# Patient Record
Sex: Female | Born: 1950
Health system: Southern US, Community
[De-identification: ages and names within clinical notes are randomized; demographics above are authoritative.]

## PROBLEM LIST (undated history)

## (undated) DIAGNOSIS — E669 Obesity, unspecified: Secondary | ICD-10-CM

## (undated) DIAGNOSIS — N183 Chronic kidney disease, stage 3 unspecified: Secondary | ICD-10-CM

## (undated) DIAGNOSIS — E119 Type 2 diabetes mellitus without complications: Secondary | ICD-10-CM

## (undated) DIAGNOSIS — C3412 Malignant neoplasm of upper lobe, left bronchus or lung: Principal | ICD-10-CM

## (undated) DIAGNOSIS — I10 Essential (primary) hypertension: Secondary | ICD-10-CM

## (undated) DIAGNOSIS — J329 Chronic sinusitis, unspecified: Secondary | ICD-10-CM

## (undated) DIAGNOSIS — D649 Anemia, unspecified: Secondary | ICD-10-CM

## (undated) DIAGNOSIS — Z720 Tobacco use: Secondary | ICD-10-CM

## (undated) DIAGNOSIS — E538 Deficiency of other specified B group vitamins: Secondary | ICD-10-CM

## (undated) HISTORY — PX: ABDOMINAL HYSTERECTOMY: SHX81

## (undated) HISTORY — DX: Deficiency of other specified B group vitamins: E53.8

## (undated) HISTORY — DX: Malignant neoplasm of upper lobe, left bronchus or lung: C34.12

---

## 2005-03-02 ENCOUNTER — Ambulatory Visit: Payer: Self-pay | Admitting: General Practice

## 2013-09-01 ENCOUNTER — Emergency Department (HOSPITAL_COMMUNITY)
Admission: EM | Admit: 2013-09-01 | Discharge: 2013-09-02 | Disposition: A | Discharge status: Home / Self Care | Payer: PRIVATE HEALTH INSURANCE | Attending: Emergency Medicine | Admitting: Emergency Medicine

## 2013-09-01 ENCOUNTER — Encounter (HOSPITAL_COMMUNITY): Payer: Self-pay | Admitting: Emergency Medicine

## 2013-09-01 DIAGNOSIS — I10 Essential (primary) hypertension: Secondary | ICD-10-CM | POA: Insufficient documentation

## 2013-09-01 DIAGNOSIS — T783XXA Angioneurotic edema, initial encounter: Secondary | ICD-10-CM | POA: Insufficient documentation

## 2013-09-01 DIAGNOSIS — F17200 Nicotine dependence, unspecified, uncomplicated: Secondary | ICD-10-CM | POA: Insufficient documentation

## 2013-09-01 DIAGNOSIS — T4995XA Adverse effect of unspecified topical agent, initial encounter: Secondary | ICD-10-CM | POA: Insufficient documentation

## 2013-09-01 HISTORY — DX: Chronic sinusitis, unspecified: J32.9

## 2013-09-01 HISTORY — DX: Essential (primary) hypertension: I10

## 2013-09-01 LAB — POCT I-STAT, CHEM 8
BUN: 19 mg/dL (ref 6–23)
Calcium, Ion: 1.24 mmol/L (ref 1.13–1.30)
Creatinine, Ser: 1.3 mg/dL — ABNORMAL HIGH (ref 0.50–1.10)
Glucose, Bld: 106 mg/dL — ABNORMAL HIGH (ref 70–99)
HCT: 42 % (ref 36.0–46.0)
Hemoglobin: 14.3 g/dL (ref 12.0–15.0)
Potassium: 3.4 mEq/L — ABNORMAL LOW (ref 3.5–5.1)

## 2013-09-01 MED ORDER — DIPHENHYDRAMINE HCL 50 MG/ML IJ SOLN
INTRAMUSCULAR | Status: AC
  Filled 2013-09-01: qty 1

## 2013-09-01 MED ORDER — FAMOTIDINE IN NACL 20-0.9 MG/50ML-% IV SOLN
INTRAVENOUS | Status: AC
  Filled 2013-09-01: qty 50

## 2013-09-01 MED ORDER — EPINEPHRINE 0.3 MG/0.3ML IJ SOAJ
0.3000 mg | Freq: Once | INTRAMUSCULAR | Status: AC
  Administered 2013-09-01: 0.3 mg via INTRAMUSCULAR
  Filled 2013-09-01: qty 0.3

## 2013-09-01 MED ORDER — DIPHENHYDRAMINE HCL 50 MG/ML IJ SOLN
50.0000 mg | Freq: Once | INTRAMUSCULAR | Status: AC
  Administered 2013-09-01: 50 mg via INTRAVENOUS

## 2013-09-01 MED ORDER — METHYLPREDNISOLONE SODIUM SUCC 125 MG IJ SOLR
125.0000 mg | Freq: Once | INTRAMUSCULAR | Status: AC
  Administered 2013-09-01: 125 mg via INTRAVENOUS

## 2013-09-01 MED ORDER — METHYLPREDNISOLONE SODIUM SUCC 125 MG IJ SOLR
INTRAMUSCULAR | Status: AC
  Filled 2013-09-01: qty 2

## 2013-09-01 NOTE — ED Notes (Signed)
Ambulated to bathroom without difficulty     Pt feels that the swelling is less now below the left eye and beside left ear.  Remains very swollen and tight left side of mouth / lips upper and lower and left cheek.  Denies any difficulty breathing or any obstruction with tongue.  Manages oral secretions

## 2013-09-01 NOTE — ED Notes (Signed)
Pt and family states swelling appears to be going down. Pt remains on cardiac monitor. No respiratory distress noted.

## 2013-09-01 NOTE — ED Provider Notes (Signed)
CSN: [id number]     Arrival date & time 09/01/13  2056 History  This chart was scribed for Hilario Quarry, MD by Quintella Reichert, ED scribe.  This patient was seen in room APA06/APA06 and the patient's care was started at 9:19 PM.   Chief Complaint  Patient presents with  . Allergic Reaction    Patient is a 62 y.o. female presenting with allergic reaction. The history is provided by the patient and the spouse. No language interpreter was used.  Allergic Reaction Presenting symptoms: swelling   Presenting symptoms: no difficulty breathing, no difficulty swallowing, no itching, no rash and no wheezing   Swelling:    Location:  Face   Duration:  1 hour   Timing:  Constant   Progression:  Worsening   Chronicity:  New Severity:  Moderate Prior allergic episodes:  Allergies to medications Context: medications   Relieved by:  None tried Worsened by:  Nothing tried Ineffective treatments:  None tried    HPI Comments: Susan Mahoney is a 62 y.o. female with h/o HTN and recent sinus infection who presents to the Emergency Department complaining of one hour of constant, worsening facial swelling.  Pt states that she went to bed at around 7 PM and woke up at 8:15 PM and found that the left side of her cheek and lips were very swollen.  She states swelling has progressively worsened since then although her husband who was present when she first woke up states that currently her swelling looks "about the same" as at onset.  Pt denies tongue swelling, throat swelling, difficulty breathing or swallowing, CP, abdominal pain, nausea, vomiting, diarrhea, or any other associated symptoms.  She notes that she began taking amoxicillin 2 days ago for a sinus infection.  She has been taking this 1x/day and last took it today at around 7 PM.  She was also placed on lisinopril on 11/19 and increased her dosage from 1x/day to 2x/day 2 days ago.  She also took one dose of Flagyl 2 days ago.  Daughter states that  pt had a similar reaction to BP medication several years ago.  Pt denies h/o heart problems to her knowledge.   Past Medical History  Diagnosis Date  . Hypertension   . Sinus infection     Past Surgical History  Procedure Laterality Date  . Abdominal hysterectomy      History reviewed. No pertinent family history.   History  Substance Use Topics  . Smoking status: Current Every Day Smoker  . Smokeless tobacco: Not on file  . Alcohol Use: No    OB History   Grav Para Term Preterm Abortions TAB SAB Ect Mult Living                  Review of Systems  HENT: Positive for facial swelling. Negative for trouble swallowing.   Respiratory: Negative for shortness of breath and wheezing.   Cardiovascular: Negative for chest pain.  Gastrointestinal: Negative for nausea, vomiting, abdominal pain and diarrhea.  Skin: Negative for itching and rash.  All other systems reviewed and are negative.     Allergies  Review of patient's allergies indicates no known allergies.  Home Medications  No current outpatient prescriptions on file.  BP 156/84  Pulse 86  Temp(Src) 98.2 F (36.8 C) (Oral)  Resp 20  Ht 5' (1.524 m)  Wt 190 lb (86.183 kg)  BMI 37.11 kg/m2  SpO2 100%  Physical Exam  Nursing note and vitals  reviewed. Constitutional: She is oriented to person, place, and time. She appears well-developed and well-nourished.  HENT:  Head: Normocephalic and atraumatic.  Right Ear: Tympanic membrane and external ear normal.  Left Ear: Tympanic membrane and external ear normal.  Nose: Nose normal. Right sinus exhibits no maxillary sinus tenderness and no frontal sinus tenderness. Left sinus exhibits no maxillary sinus tenderness and no frontal sinus tenderness.  Swelling to left side of lips and cheek.  Upper lip more swollen than lower lip.  No swelling of, or oropharynx.  Eyes: Conjunctivae and EOM are normal. Pupils are equal, round, and reactive to light. Right eye exhibits no  nystagmus. Left eye exhibits no nystagmus.  Neck: Normal range of motion. Neck supple.  Cardiovascular: Normal rate, regular rhythm, normal heart sounds and intact distal pulses.   Pulmonary/Chest: Effort normal and breath sounds normal. No respiratory distress. She has no wheezes. She has no rales. She exhibits no tenderness.  Abdominal: Soft. Bowel sounds are normal. She exhibits no distension and no mass. There is no tenderness.  Musculoskeletal: Normal range of motion. She exhibits no edema and no tenderness.  Neurological: She is alert and oriented to person, place, and time.  Skin: Skin is warm and dry. No rash noted.  Psychiatric: She has a normal mood and affect. Her behavior is normal. Judgment and thought content normal.    ED Course  Procedures (including critical care time)  DIAGNOSTIC STUDIES: Oxygen Saturation is 100% on room air, normal by my interpretation.    COORDINATION OF CARE: 9:27 PM-Discussed treatment plan which includes Benadryl, Solu-Medrol, Epinephrine, oxygen therapy, labs, and monitoring pt's symptoms with pt at bedside and pt agreed to plan.    Labs Review Labs Reviewed - No data to display  Imaging Review No results found.  EKG Interpretation   None       MDM  No diagnosis found. 10:46 PM Patient with some decreased swelling of her cheek area and she's not feel any obstruction of her airway. Her tongue remains uninvolved as does her oropharynx. She continues to have some left upper and lower lip swelling the   62 year old female who comes in with angioedema which appears to be secondary to ACE inhibitor. She has improved although department his not appear to have any airway compromise. However, she is also on amoxicillin and is advised that she should avoid amoxicillin and penicillins in the future as they could not rule out that this was an allergic reaction. She is given strict return precautions and voices understanding. She also understands  she should not be on lisinopril or any ACE inhibitor in the future. She'll contact her primary care physician on Monday for change in her antihypertensives.   Hilario Quarry, MD 09/01/13 [phone]

## 2013-09-01 NOTE — ED Notes (Addendum)
Pt has facial , oral swelling,  Onset app 8 pm.  Started Lisinopril app 2 weeks ago  Being tx for sinus infection.  And taking amoxicillin.

## 2013-09-01 NOTE — ED Notes (Signed)
Pt reports going to bed & waking to find the left side of her face swollen, notable to cheeks & lips. No swelling noted to the tongue. Pt airway patent & pt talking w/ no complications.

## 2013-09-02 ENCOUNTER — Encounter (HOSPITAL_COMMUNITY): Payer: Self-pay | Admitting: Emergency Medicine

## 2013-09-02 ENCOUNTER — Observation Stay (HOSPITAL_COMMUNITY)
Admission: EM | Admit: 2013-09-02 | Discharge: 2013-09-03 | Disposition: A | Discharge status: Home / Self Care | Payer: PRIVATE HEALTH INSURANCE | Attending: Internal Medicine | Admitting: Internal Medicine

## 2013-09-02 DIAGNOSIS — T783XXA Angioneurotic edema, initial encounter: Principal | ICD-10-CM | POA: Insufficient documentation

## 2013-09-02 DIAGNOSIS — E669 Obesity, unspecified: Secondary | ICD-10-CM

## 2013-09-02 DIAGNOSIS — F17200 Nicotine dependence, unspecified, uncomplicated: Secondary | ICD-10-CM | POA: Insufficient documentation

## 2013-09-02 DIAGNOSIS — E1169 Type 2 diabetes mellitus with other specified complication: Secondary | ICD-10-CM | POA: Diagnosis present

## 2013-09-02 DIAGNOSIS — E119 Type 2 diabetes mellitus without complications: Secondary | ICD-10-CM | POA: Insufficient documentation

## 2013-09-02 DIAGNOSIS — Z720 Tobacco use: Secondary | ICD-10-CM

## 2013-09-02 DIAGNOSIS — I10 Essential (primary) hypertension: Secondary | ICD-10-CM | POA: Diagnosis present

## 2013-09-02 DIAGNOSIS — D72829 Elevated white blood cell count, unspecified: Secondary | ICD-10-CM | POA: Insufficient documentation

## 2013-09-02 DIAGNOSIS — T46905A Adverse effect of unspecified agents primarily affecting the cardiovascular system, initial encounter: Secondary | ICD-10-CM | POA: Insufficient documentation

## 2013-09-02 DIAGNOSIS — I498 Other specified cardiac arrhythmias: Secondary | ICD-10-CM | POA: Insufficient documentation

## 2013-09-02 DIAGNOSIS — D649 Anemia, unspecified: Secondary | ICD-10-CM | POA: Insufficient documentation

## 2013-09-02 DIAGNOSIS — R001 Bradycardia, unspecified: Secondary | ICD-10-CM | POA: Diagnosis present

## 2013-09-02 HISTORY — DX: Obesity, unspecified: E66.9

## 2013-09-02 HISTORY — DX: Tobacco use: Z72.0

## 2013-09-02 HISTORY — DX: Type 2 diabetes mellitus without complications: E11.9

## 2013-09-02 HISTORY — DX: Anemia, unspecified: D64.9

## 2013-09-02 LAB — TYPE AND SCREEN
ABO/RH(D): O POS
Antibody Screen: NEGATIVE

## 2013-09-02 LAB — POCT I-STAT, CHEM 8
BUN: 25 mg/dL — ABNORMAL HIGH (ref 6–23)
Calcium, Ion: 1.28 mmol/L (ref 1.13–1.30)
Chloride: 102 mEq/L (ref 96–112)
Creatinine, Ser: 1.1 mg/dL (ref 0.50–1.10)
Glucose, Bld: 169 mg/dL — ABNORMAL HIGH (ref 70–99)
HCT: 44 % (ref 36.0–46.0)
Hemoglobin: 15 g/dL (ref 12.0–15.0)
Potassium: 4.5 mEq/L (ref 3.5–5.1)
Sodium: 141 mEq/L (ref 135–145)
TCO2: 30 mmol/L (ref 0–100)

## 2013-09-02 LAB — GLUCOSE, CAPILLARY: Glucose-Capillary: 214 mg/dL — ABNORMAL HIGH (ref 70–99)

## 2013-09-02 LAB — MRSA PCR SCREENING: MRSA by PCR: NEGATIVE

## 2013-09-02 MED ORDER — MORPHINE SULFATE 2 MG/ML IJ SOLN: 2.0000 mg | INTRAMUSCULAR | Status: DC | PRN

## 2013-09-02 MED ORDER — ACETAMINOPHEN 325 MG PO TABS: 650.0000 mg | ORAL_TABLET | Freq: Four times a day (QID) | ORAL | Status: DC | PRN

## 2013-09-02 MED ORDER — POTASSIUM CHLORIDE IN NACL 20-0.9 MEQ/L-% IV SOLN
INTRAVENOUS | Status: DC
  Administered 2013-09-02 – 2013-09-03 (×2): via INTRAVENOUS

## 2013-09-02 MED ORDER — FAMOTIDINE IN NACL 20-0.9 MG/50ML-% IV SOLN
20.0000 mg | Freq: Once | INTRAVENOUS | Status: AC
  Administered 2013-09-02: 20 mg via INTRAVENOUS
  Filled 2013-09-02: qty 50

## 2013-09-02 MED ORDER — ACETAMINOPHEN 650 MG RE SUPP: 650.0000 mg | Freq: Four times a day (QID) | RECTAL | Status: DC | PRN

## 2013-09-02 MED ORDER — FAMOTIDINE IN NACL 20-0.9 MG/50ML-% IV SOLN
INTRAVENOUS | Status: AC
  Filled 2013-09-02: qty 50

## 2013-09-02 MED ORDER — HYDROCHLOROTHIAZIDE 12.5 MG PO CAPS
12.5000 mg | ORAL_CAPSULE | Freq: Every day | ORAL | Status: DC
  Administered 2013-09-02 – 2013-09-03 (×2): 12.5 mg via ORAL
  Filled 2013-09-02 (×2): qty 1

## 2013-09-02 MED ORDER — NICOTINE 14 MG/24HR TD PT24
14.0000 mg | MEDICATED_PATCH | Freq: Every day | TRANSDERMAL | Status: DC
  Administered 2013-09-02 – 2013-09-03 (×2): 14 mg via TRANSDERMAL
  Filled 2013-09-02 (×2): qty 1

## 2013-09-02 MED ORDER — DIPHENHYDRAMINE HCL 50 MG/ML IJ SOLN
12.5000 mg | Freq: Four times a day (QID) | INTRAMUSCULAR | Status: DC
  Administered 2013-09-02 – 2013-09-03 (×3): 12.5 mg via INTRAVENOUS
  Filled 2013-09-02 (×3): qty 1

## 2013-09-02 MED ORDER — INSULIN ASPART 100 UNIT/ML ~~LOC~~ SOLN
0.0000 [IU] | Freq: Every day | SUBCUTANEOUS | Status: DC
  Administered 2013-09-02: 2 [IU] via SUBCUTANEOUS

## 2013-09-02 MED ORDER — ALBUTEROL SULFATE (5 MG/ML) 0.5% IN NEBU: 2.5000 mg | INHALATION_SOLUTION | RESPIRATORY_TRACT | Status: DC | PRN

## 2013-09-02 MED ORDER — DIPHENHYDRAMINE HCL 50 MG/ML IJ SOLN
25.0000 mg | Freq: Once | INTRAMUSCULAR | Status: AC
  Administered 2013-09-02: 25 mg via INTRAVENOUS
  Filled 2013-09-02: qty 1

## 2013-09-02 MED ORDER — ONDANSETRON HCL 4 MG PO TABS: 4.0000 mg | ORAL_TABLET | Freq: Four times a day (QID) | ORAL | Status: DC | PRN

## 2013-09-02 MED ORDER — GUAIFENESIN-DM 100-10 MG/5ML PO SYRP: 5.0000 mL | ORAL_SOLUTION | ORAL | Status: DC | PRN

## 2013-09-02 MED ORDER — FAMOTIDINE IN NACL 20-0.9 MG/50ML-% IV SOLN
20.0000 mg | Freq: Two times a day (BID) | INTRAVENOUS | Status: DC
  Administered 2013-09-02 – 2013-09-03 (×2): 20 mg via INTRAVENOUS
  Filled 2013-09-02 (×4): qty 50

## 2013-09-02 MED ORDER — DEXAMETHASONE SODIUM PHOSPHATE 4 MG/ML IJ SOLN
12.0000 mg | Freq: Once | INTRAMUSCULAR | Status: AC
  Administered 2013-09-02: 12 mg via INTRAVENOUS
  Filled 2013-09-02: qty 3

## 2013-09-02 MED ORDER — METHYLPREDNISOLONE SODIUM SUCC 125 MG IJ SOLR
60.0000 mg | INTRAMUSCULAR | Status: DC
  Administered 2013-09-02: 60 mg via INTRAVENOUS
  Filled 2013-09-02: qty 2

## 2013-09-02 MED ORDER — OXYMETAZOLINE HCL 0.05 % NA SOLN
1.0000 | Freq: Every day | NASAL | Status: DC | PRN
  Filled 2013-09-02: qty 15

## 2013-09-02 MED ORDER — ONDANSETRON HCL 4 MG/2ML IJ SOLN: 4.0000 mg | Freq: Four times a day (QID) | INTRAMUSCULAR | Status: DC | PRN

## 2013-09-02 MED ORDER — INSULIN ASPART 100 UNIT/ML ~~LOC~~ SOLN
0.0000 [IU] | Freq: Three times a day (TID) | SUBCUTANEOUS | Status: DC
  Administered 2013-09-03: 3 [IU] via SUBCUTANEOUS

## 2013-09-02 MED ORDER — ALUM & MAG HYDROXIDE-SIMETH 200-200-20 MG/5ML PO SUSP: 30.0000 mL | Freq: Four times a day (QID) | ORAL | Status: DC | PRN

## 2013-09-02 NOTE — ED Notes (Signed)
Patient was seen here yesterday for left side angioedma that started yesterday as a reaction to lisinopril that she has been taking since 08/09/13. Patient reports being treated with solumedrol, benadryl, and epi-pen. Patient instructed to come back if worse. Patient lips now complete swollen and patient feels "tickle in back of throat." Denies any difficulty breathing or swallowing. Denies tongue swelling.

## 2013-09-02 NOTE — H&P (Signed)
[facility] History and Physical  Susan Mahoney ZOX:[id number] DOB: [birth year] DOA: 09/02/2013  Referring physician: ED physician, Dr. Juleen China PCP: Rush Barer, PA-C   Chief Complaint: Facial swelling.  HPI: Susan Mahoney is a 62 y.o. female with a history of hypertension and recent diagnosis of type 2 diabetes mellitus, who presents to the emergency department today with worsening swelling of her face and lips. She presented to the ED last night complaining of left-sided facial swelling. She was treated with Solu-Medrol, Benadryl, and an EpiPen for presumed ACE inhibitor induced angioedema. She was discharged in improved condition. At approximately 4:00 AM this morning, she felt more facial swelling. She looked in the mirror and found that the right side of her face was swollen and her lips were much more swollen. She denies tongue swelling, difficulty swallowing, or difficulty breathing. She denied taking further lisinopril which had been newly prescribed in November for add-on treatment of hypertension. She was recently started on treatment with amoxicillin as well for a sinus infection, but she has taken amoxicillin in the past with no allergic reaction. Currently, she complains of swollen lips and a swollen face, but no shortness of breath, chest pain, nausea, vomiting, or cough.  In the ED, she is afebrile and hemodynamically stable though transiently bradycardic with a heart rate of 46. Her heart rate is now 66. She is oxygenating 94% on room air Her lab data are significant for BUN of 25 and a glucose of 169. She is being admitted for further evaluation and management.    Review of Systems:  Constitutional:  No weight loss, night sweats, Fevers, chills, fatigue.  HEENT:  No headaches, Difficulty swallowing,Tooth/dental problems,Sore throat,  No sneezing, itching, ear ache. She does have mild nasal congestion but no facial pain.  Cardio-vascular:  No chest pain, Orthopnea,  PND, swelling in lower extremities, anasarca, dizziness, palpitations  GI:  No heartburn, indigestion, abdominal pain, nausea, vomiting, diarrhea, change in bowel habits, loss of appetite  Resp:  No shortness of breath with exertion or at rest. No excess mucus, no productive cough, No non-productive cough, No coughing up of blood.No change in color of mucus.No wheezing.No chest wall deformity  Skin:  no rash or lesions.  GU:  no dysuria, change in color of urine, no urgency or frequency. No flank pain.  Musculoskeletal:  No joint pain or swelling. No decreased range of motion. No back pain.  Psych:  No change in mood or affect. No depression or anxiety. No memory loss.   Past Medical History  Diagnosis Date  . Hypertension   . Sinus infection   . Diabetes mellitus, type 2   . Obesity   . Anemia   . Tobacco abuse    Past Surgical History  Procedure Laterality Date  . Abdominal hysterectomy     Social History: She is married. She has one daughter. She is employed. She smokes a half a pack of cigarettes per day. She denies alcohol use or illicit drug use.   Allergies  Allergen Reactions  . Ace Inhibitors Swelling    Family History  Problem Relation Age of Onset  . Cancer Father    family history: Her mother died of natural causes at 3 years of age. Her father died of colon cancer.   Prior to Admission medications   Medication Sig Start Date End Date Taking? Authorizing Provider  acetaminophen (TYLENOL) 500 MG tablet Take 500 mg by mouth every 6 (six) hours as needed for mild pain or  moderate pain.   Yes Historical Provider, MD  Cetirizine HCl 10 MG CAPS Take 10 mg by mouth at bedtime.   Yes Historical Provider, MD  FERROUS SULFATE PO Take 5 g by mouth 2 (two) times daily.   Yes Historical Provider, MD  fluconazole (DIFLUCAN) 150 MG tablet Take 150 mg by mouth once. *may repeat as a one-time dose in 3 days if still symptomatic*   Yes Historical Provider, MD   hydrochlorothiazide (MICROZIDE) 12.5 MG capsule Take 12.5 mg by mouth daily.   Yes Historical Provider, MD  oxymetazoline (NASAL SPRAY 12 HOUR) 0.05 % nasal spray Place 1 spray into both nostrils daily as needed for congestion.   Yes Historical Provider, MD   Physical Exam: Filed Vitals:   09/02/13 1708  BP: 155/95  Pulse: 66  Temp: 98.5 F (36.9 C)  Resp: 23    BP 155/95  Pulse 66  Temp(Src) 98.5 F (36.9 C) (Oral)  Resp 23  Ht 5' (1.524 m)  Wt 86.183 kg (190 lb)  BMI 37.11 kg/m2  SpO2 94%  General: Pleasant obese 62 year old African-American woman in no acute distress. Face: Moderate swelling of her face globally without periorbital edema. The swelling is mostly in the maxillary region. She has edema of her upper and lower lips. Her tongue is not swollen. Her mucous membranes are mildly dry. There is no posterior exudates, erythema, or edema. HEENT: Head is normocephalic and nontraumatic. Pupils are equal, round, and reactive to light. Extraocular movements are intact. Conjunctivae are clear. Sclera white. Oropharynx as above. Nasal mucosa is moist. Minimal maxillary tenderness bilaterally. No active rhinorrhea. Neck: Obese, minimal edema, supple, no appreciable adenopathy, no appreciable thyromegaly, no JVD, no bruit. Lungs/respiratory: Breathing is nonlabored. Lungs are clear to auscultation bilaterally. No stridor. Heart: S1, S2, with borderline bradycardia and a soft systolic murmur. Abdomen: Obese, positive bowel sounds, soft, nontender, nondistended. GU and rectal: Deferred. Extremities: Pedal pulses palpable. No pretibial edema and no pedal edema. No acute hot red joints. Psychiatric: She is alert and oriented x3. Her speech is clear. Her affect is pleasant. Neurologic: Cranial nerves II through XII are intact. Strength is 5 over 5 throughout. Sensation is intact.           Labs on Admission:  Basic Metabolic Panel:  Recent Labs Lab 09/01/13 2158 09/02/13 1033   NA 143 141  K 3.4* 4.5  CL 102 102  GLUCOSE 106* 169*  BUN 19 25*  CREATININE 1.30* 1.10   Liver Function Tests: No results found for this basename: AST, ALT, ALKPHOS, BILITOT, PROT, ALBUMIN,  in the last 168 hours No results found for this basename: LIPASE, AMYLASE,  in the last 168 hours No results found for this basename: AMMONIA,  in the last 168 hours CBC:  Recent Labs Lab 09/01/13 2158 09/02/13 1033  HGB 14.3 15.0  HCT 42.0 44.0   Cardiac Enzymes: No results found for this basename: CKTOTAL, CKMB, CKMBINDEX, TROPONINI,  in the last 168 hours  BNP (last 3 results) No results found for this basename: PROBNP,  in the last 8760 hours CBG: No results found for this basename: GLUCAP,  in the last 168 hours  Radiological Exams on Admission: No results found.  EKG: Independently reviewed. Not ordered.  Assessment/Plan Principal Problem:   Angioedema Active Problems:   Unspecified essential hypertension   Diabetes mellitus type 2 in obese   Morbid obesity   Tobacco abuse   Bradycardia   The patient is a 62 year old with a  classic presentation of angioedema from ACE inhibitor therapy. She was recently started on lisinopril in November 2014. She was treated in the ED last night with some improvement, but her symptoms worsened today. She appears to be in no respiratory distress. On exam, there is no evidence of stridor or airway compromise.    Plan: 1. Fresh frozen plasma was started in the ED. She was also given Decadron IV, IV Pepcid, and IV Benadryl.  2. We'll continue steroid therapy with Solu-Medrol. We'll continue IV Pepcid and IV Benadryl. 3. The patient and her family were instructed to not take lisinopril or any class of ACE inhibitors anymore indefinitely. 4. She was encouraged to stop smoking. Tobacco cessation counseling will be ordered. Will provide nicotine replacement therapy. 5. We'll start sliding scale NovoLog for diabetes. She was recently diagnosed  and is being treated with lifestyle changes only. However with recent steroid therapy, she will need additional pharmacological therapy. 6. We'll continue hydrochlorothiazide for treatment of hypertension. 7. Gentle IV fluids. 8. We'll observe her in the step down unit overnight. 9. We'll order additional laboratory studies in the morning.    Code Status: Full code Family Communication: Discussed with her husband and daughter. Disposition Plan: Anticipate discharge to home in 24-48 hours.  Time spent: One hour.  [facility] Pager [phone].

## 2013-09-02 NOTE — ED Provider Notes (Signed)
CSN: [id number]     Arrival date & time 09/02/13  4098 History  This chart was scribed for Raeford Razor, MD by Luisa Dago, ED Scribe. This patient was seen in room APA15/APA15 and the patient's care was started at 10:00 AM.    Chief Complaint  Patient presents with  . Angioedema    The history is provided by the patient. No language interpreter was used.   HPI Comments: Susan Mahoney is a 62 y.o. female who presents to the Emergency Department complaining of angioedema that started last night. Pt was seen in the ED last night for the same. She was given solumedrol, benadryl, and epi-pen with improvement. She was advised to stop the lisinopril and amoxicillin that she had ben prescribed for a sinus infection. She denies any further doses since then. She states that she has returned today due to worsening facial swelling that involves bilateral lips. She denies tongue involvement. She states that she has been unable to take her medications, eat or drink due to the swelling. She is complaining of an associated mild headache described as pressure to the right side of her head. Pt reports that its hard to close her mouth. She denies diarrhea, SOB, abdominal pain, nausea, and cough.  Past Medical History  Diagnosis Date  . Hypertension   . Sinus infection    Past Surgical History  Procedure Laterality Date  . Abdominal hysterectomy     Family History  Problem Relation Age of Onset  . Cancer Father    History  Substance Use Topics  . Smoking status: Current Every Day Smoker  . Smokeless tobacco: Not on file  . Alcohol Use: No   OB History   Grav Para Term Preterm Abortions TAB SAB Ect Mult Living   1 1 1       1      Review of Systems  Constitutional: Negative for fever and chills.  HENT: Positive for facial swelling. Negative for drooling.   Respiratory: Negative for cough and shortness of breath.   Gastrointestinal: Negative for nausea, abdominal pain and diarrhea.   Neurological: Positive for headaches.  All other systems reviewed and are negative.    Allergies  Ace inhibitors  Home Medications   Current Outpatient Rx  Name  Route  Sig  Dispense  Refill  . acetaminophen (TYLENOL) 500 MG tablet   Oral   Take 500 mg by mouth every 6 (six) hours as needed for mild pain or moderate pain.         . Cetirizine HCl 10 MG CAPS   Oral   Take 10 mg by mouth at bedtime.         Marland Kitchen FERROUS SULFATE PO   Oral   Take 5 g by mouth 2 (two) times daily.         . fluconazole (DIFLUCAN) 150 MG tablet   Oral   Take 150 mg by mouth once. *may repeat as a one-time dose in 3 days if still symptomatic*         . hydrochlorothiazide (MICROZIDE) 12.5 MG capsule   Oral   Take 12.5 mg by mouth daily.         Marland Kitchen oxymetazoline (NASAL SPRAY 12 HOUR) 0.05 % nasal spray   Each Nare   Place 1 spray into both nostrils daily as needed for congestion.          Triage Vitals: BP 146/63  Pulse 77  Resp 16  Ht 5' (1.524 m)  Wt 190 lb (86.183 kg)  BMI 37.11 kg/m2  SpO2 98%  Physical Exam  Nursing note and vitals reviewed. Constitutional: She appears well-developed and well-nourished. No distress.  HENT:  Head: Atraumatic.  Symmetric swelling of upper and lower lip. Bilateral facial swelling. Toungue appears grossly normal. Posterior pharynx is clear. Handling secretions.  Eyes: Conjunctivae are normal. Right eye exhibits no discharge. Left eye exhibits no discharge.  Neck: Neck supple.  Cardiovascular: Normal rate, regular rhythm and normal heart sounds.  Exam reveals no gallop and no friction rub.   No murmur heard. Pulmonary/Chest: Effort normal and breath sounds normal. No stridor. No respiratory distress.  Abdominal: Soft. She exhibits no distension. There is no tenderness.  Musculoskeletal: She exhibits no edema and no tenderness.  Neurological: She is alert.  Skin: Skin is warm and dry.  Psychiatric: She has a normal mood and affect. Her  behavior is normal. Thought content normal.    ED Course  Procedures (including critical care time)  DIAGNOSTIC STUDIES: Oxygen Saturation is 98% on room air, normal by my interpretation.    COORDINATION OF CARE: 10:04 AM-Discussed treatment plan which includes fresh frozen plasma with pt at bedside and pt agreed to plan.   Labs Review Labs Reviewed  POCT I-STAT, CHEM 8 - Abnormal; Notable for the following:    BUN 25 (*)    Glucose, Bld 169 (*)    All other components within normal limits  C4 COMPLEMENT  C1 ESTERASE INHIBITOR PANEL  TYPE AND SCREEN  PREPARE FRESH FROZEN PLASMA   Imaging Review No results found.  EKG Interpretation   None       MDM   1. Angioedema, initial encounter      62yF with angioedema. Suspicion remains that this is ACEI induced. Currently doesn't seem to have significant airway compromise, but progression of symptoms is concerning. Will transfuse FFP and continue to closely monitor.   Pt reports previous episode of facial swelling about 10 years ago. Thinks possibly may have been medication related, but can't remember specifics of that episode.   I personally preformed the services scribed in my presence. The recorded information has been reviewed is accurate. Raeford Razor, MD.    Raeford Razor, MD 09/02/13 [phone]

## 2013-09-02 NOTE — ED Notes (Signed)
Pt presents with bilateral facial swelling since last night. Pt was seen here last night and given meds to help with the swelling and was told to return to further evaluation if symptoms worsened. Pt states swelling of right side of face and lips have worsened. Pt denies difficulty breathing.

## 2013-09-03 LAB — CBC
HCT: 37.8 % (ref 36.0–46.0)
Hemoglobin: 11.7 g/dL — ABNORMAL LOW (ref 12.0–15.0)
MCHC: 31 g/dL (ref 30.0–36.0)
Platelets: 212 10*3/uL (ref 150–400)
RDW: 17.5 % — ABNORMAL HIGH (ref 11.5–15.5)
WBC: 14.5 10*3/uL — ABNORMAL HIGH (ref 4.0–10.5)

## 2013-09-03 LAB — COMPREHENSIVE METABOLIC PANEL
ALT: 27 U/L (ref 0–35)
BUN: 24 mg/dL — ABNORMAL HIGH (ref 6–23)
CO2: 26 mEq/L (ref 19–32)
Calcium: 10.1 mg/dL (ref 8.4–10.5)
Creatinine, Ser: 1.16 mg/dL — ABNORMAL HIGH (ref 0.50–1.10)
GFR calc Af Amer: 58 mL/min — ABNORMAL LOW (ref >90)
GFR calc non Af Amer: 50 mL/min — ABNORMAL LOW (ref >90)
Glucose, Bld: 245 mg/dL — ABNORMAL HIGH (ref 70–99)
Potassium: 3.9 mEq/L (ref 3.5–5.1)
Sodium: 141 mEq/L (ref 135–145)
Total Bilirubin: 0.2 mg/dL — ABNORMAL LOW (ref 0.3–1.2)
Total Protein: 7.7 g/dL (ref 6.0–8.3)

## 2013-09-03 LAB — PROTIME-INR
INR: 0.98 (ref 0.00–1.49)
Prothrombin Time: 12.8 seconds (ref 11.6–15.2)

## 2013-09-03 LAB — PREPARE FRESH FROZEN PLASMA
Unit division: 0
Unit division: 0

## 2013-09-03 LAB — TSH: TSH: 0.199 u[IU]/mL — ABNORMAL LOW (ref 0.350–4.500)

## 2013-09-03 LAB — GLUCOSE, CAPILLARY: Glucose-Capillary: 168 mg/dL — ABNORMAL HIGH (ref 70–99)

## 2013-09-03 MED ORDER — TRIAMTERENE-HCTZ 37.5-25 MG PO CAPS: 1.0000 | ORAL_CAPSULE | Freq: Every day | ORAL | Status: DC

## 2013-09-03 MED ORDER — FAMOTIDINE 10 MG PO TABS: 20.0000 mg | ORAL_TABLET | Freq: Two times a day (BID) | ORAL | Status: DC

## 2013-09-03 MED ORDER — GLIMEPIRIDE 1 MG PO TABS: ORAL_TABLET | ORAL | Status: DC

## 2013-09-03 MED ORDER — DIPHENHYDRAMINE HCL 25 MG PO TABS: 25.0000 mg | ORAL_TABLET | Freq: Two times a day (BID) | ORAL | Status: DC

## 2013-09-03 MED ORDER — PREDNISONE 20 MG PO TABS: 20.0000 mg | ORAL_TABLET | Freq: Every day | ORAL | Status: DC

## 2013-09-03 MED ORDER — CETIRIZINE HCL 10 MG PO CAPS: 10.0000 mg | ORAL_CAPSULE | Freq: Every day | ORAL | Status: DC

## 2013-09-03 NOTE — Discharge Summary (Signed)
Physician Discharge Summary  Susan Mahoney ZOX:[id number] DOB: 1950-10-20 DOA: 09/02/2013  PCP: Susan Mahoney  Admit date: 09/02/2013 Discharge date: 09/03/2013  Time spent: Greater than 30 minutes  Recommendations for Outpatient Follow-up:  1. The patient should have a followup of her diabetes. Amaryl was started at 1 mg daily when necessary for blood sugar of 200 and over. Hemoglobin A1c pending and TSH pending. 2. ACE inhibitors have been discontinued indefinitely.  Discharge Diagnoses:  1. Angioedema secondary to lisinopril. 2. Type 2 diabetes mellitus in obese patient, exacerbated by steroid treatment. 3. Hypertension. 4. Tobacco abuse. 5. Microcytic anemia. Further workup per primary care provider. 6. Transient bradycardia. 7. Leukocytosis secondary to steroid therapy.  Discharge Condition: Improved.  Diet recommendation: Heart healthy/carbohydrate modified.  Filed Weights   09/02/13 0931 09/03/13 0500  Weight: 86.183 kg (190 lb) 84.7 kg (186 lb 11.7 oz)    History of present illness:   HPI: Susan Mahoney is a 62 y.o. female with a history of hypertension and recent diagnosis of type 2 diabetes mellitus, who presented to the emergency department with worsening swelling of her face and lips. She presented to the ED the night before complaining of left-sided facial swelling. She was treated with Solu-Medrol, Benadryl, and an EpiPen for presumed ACE inhibitor induced angioedema. She was discharged in improved condition. At approximately 4:00 AM this morning, she felt more facial swelling. She looked in the mirror and found that the right side of her face was swollen and her lips were much more swollen. She denied tongue swelling, difficulty swallowing, or difficulty breathing. She denied taking further lisinopril which had been newly prescribed in November for add-on treatment of hypertension. She was recently started on treatment with amoxicillin as well for a sinus  infection, but she has taken amoxicillin in the past with no allergic reaction. She denied shortness of breath, chest pain, nausea, vomiting, or cough.  In the ED, she was afebrile and hemodynamically stable though transiently bradycardic with a heart rate of 46. Her heart rate improved to 66. She was oxygenating 94% on room air Her lab data were significant for BUN of 25 and a glucose of 169. She was admitted for further evaluation and management.   Hospital Course:  The patient was started on fresh frozen plasma in the emergency department. She was also given IV Decadron, IV Pepcid, and IV Benadryl in the ED. She was admitted to the step down unit for closer observation. Steroid therapy continued with Solu-Medrol. She was also continued on IV Pepcid and IV Benadryl. Supportive treatment was given. She was started on gentle IV fluids. She was instructed to discontinue taking lisinopril as she had been instructed previously and to never take it and all other ACE inhibitor medications in that class again. She voiced understanding. She was started on hydrochlorothiazide during hospitalization. However, she had been taking triamterene/HCTZ before it was discontinued in favor of lisinopril by her primary care provider. She was instructed to restart this medication upon discharge. A nicotine patch was placed for nicotine replacement therapy. She was strongly advised to stop smoking. She was recently diagnosed with diabetes. Because of steroid therapy, her blood glucose increased to the 200s. Sliding scale NovoLog was started. She was instructed on home glucose monitoring. Amaryl was prescribed at the time of discharge at 1 mg daily when necessary for blood glucose of 200 and over. Further management will be deferred to her primary care provider.  The following morning, the patient's angioedema completely resolved.  She had no facial or lip edema. There was no evidence of shortness of breath or any airway compromise  at all during hospitalization. She was discharged on 3 more days of Pepcid, Benadryl, and prednisone. She will followup with her primary care provider in a couple days.  Procedures:  None  Consultations:  None  Discharge Exam: Filed Vitals:   09/03/13 0400  BP: 111/100  Pulse:   Temp: 98 F (36.7 C)  Resp: 20   pulse 80. Oxygen saturation 96%.  General: Pleasant alert 62 year old woman in no acute distress. Face: Resolution of facial edema and lip edema. Cardiovascular: S1, S2, with a soft systolic murmur. Respiratory: Clear to auscultation bilaterally.  Discharge Instructions  Discharge Orders   Future Orders Complete By Expires   Diet - low sodium heart healthy  As directed    Diet Carb Modified  As directed    Discharge instructions  As directed    Comments:     Do not take lisinopril or any blood pressure medications in the ACE inhibitor group ever again.   Increase activity slowly  As directed        Medication List    STOP taking these medications       hydrochlorothiazide 12.5 MG capsule  Commonly known as:  MICROZIDE      TAKE these medications       acetaminophen 500 MG tablet  Commonly known as:  TYLENOL  Take 500 mg by mouth every 6 (six) hours as needed for mild pain or moderate pain.     Cetirizine HCl 10 MG Caps  Take 1 capsule (10 mg total) by mouth at bedtime. Restart after you've completed a three-day course of Benadryl.     diphenhydrAMINE 25 MG tablet  Commonly known as:  BENADRYL  Take 1 tablet (25 mg total) by mouth 2 (two) times daily. Take for 3 more days.     famotidine 10 MG tablet  Commonly known as:  PEPCID AC  Take 2 tablets (20 mg total) by mouth 2 (two) times daily. Take for 3 more days.     FERROUS SULFATE PO  Take 5 g by mouth 2 (two) times daily.     fluconazole 150 MG tablet  Commonly known as:  DIFLUCAN  Take 150 mg by mouth once. *may repeat as a one-time dose in 3 days if still symptomatic*     glimepiride 1 MG  tablet  Commonly known as:  AMARYL  Take 1 tablet daily if your blood sugar is 200 and over. Further instructions per your primary care provider.     NASAL SPRAY 12 HOUR 0.05 % nasal spray  Generic drug:  oxymetazoline  Place 1 spray into both nostrils daily as needed for congestion.     predniSONE 20 MG tablet  Commonly known as:  DELTASONE  Take 1 tablet (20 mg total) by mouth daily with breakfast. Take for 3 more days.     triamterene-hydrochlorothiazide 37.5-25 MG per capsule  Commonly known as:  DYAZIDE  Take 1 each (1 capsule total) by mouth daily.       Allergies  Allergen Reactions  . Ace Inhibitors Swelling       Follow-up Information   Follow up with CLAGGETT,ELIN, PA-C In 2 days.   Specialty:  Family Medicine   Contact information:   [address] [place] [address] [phone]        The results of significant diagnostics from this hospitalization (including imaging,  microbiology, ancillary and laboratory) are listed below for reference.    Significant Diagnostic Studies: No results found.  Microbiology: Recent Results (from the past 240 hour(s))  MRSA PCR SCREENING     Status: None   Collection Time    09/02/13  3:45 PM      Result Value Range Status   MRSA by PCR NEGATIVE  NEGATIVE Final   Comment:            The GeneXpert MRSA Assay (FDA     approved for NASAL specimens     only), is one component of a     comprehensive MRSA colonization     surveillance program. It is not     intended to diagnose MRSA     infection nor to guide or     monitor treatment for     MRSA infections.     Labs: Basic Metabolic Panel:  Recent Labs Lab 09/01/13 2158 09/02/13 1033 09/03/13 0443  NA 143 141 141  K 3.4* 4.5 3.9  CL 102 102 101  CO2  --   --  26  GLUCOSE 106* 169* 245*  BUN 19 25* 24*  CREATININE 1.30* 1.10 1.16*  CALCIUM  --   --  10.1   Liver Function Tests:  Recent Labs Lab 09/03/13 0443  AST 21  ALT 27  ALKPHOS 105   BILITOT 0.2*  PROT 7.7  ALBUMIN 3.6   No results found for this basename: LIPASE, AMYLASE,  in the last 168 hours No results found for this basename: AMMONIA,  in the last 168 hours CBC:  Recent Labs Lab 09/01/13 2158 09/02/13 1033 09/03/13 0443  WBC  --   --  14.5*  HGB 14.3 15.0 11.7*  HCT 42.0 44.0 37.8  MCV  --   --  67.1*  PLT  --   --  212   Cardiac Enzymes: No results found for this basename: CKTOTAL, CKMB, CKMBINDEX, TROPONINI,  in the last 168 hours BNP: BNP (last 3 results) No results found for this basename: PROBNP,  in the last 8760 hours CBG:  Recent Labs Lab 09/02/13 2131 09/03/13 0756  GLUCAP 214* 168*       Signed:  Nubia Ziesmer  [facility] 09/03/2013, 9:19 AM

## 2013-09-03 NOTE — Progress Notes (Signed)
Utilization Review completed.  

## 2013-09-03 NOTE — Progress Notes (Signed)
Client is stable at this time, facial edema resolved. Reviewed discharge instructions, reviewed discharge medication list, instructed on use of glucometer, when to test blood sugars, and how to keep blood sugar log. Instructed on normal blood sugar readings and s/s of hyper and hypo glycemia and treatment of each, and advised on s/s to report to MD. Client to schedule follow up appointment with PCP for this week. Client discharged to home with spouse in stable condition.

## 2013-09-05 LAB — C4 COMPLEMENT: Complement C4, Body Fluid: 78 mg/dL — ABNORMAL HIGH (ref 10–40)

## 2013-09-06 LAB — C1 ESTERASE INHIBITOR: C1INH SerPl-mCnc: 48 mg/dL — ABNORMAL HIGH (ref 21–39)

## 2013-09-07 ENCOUNTER — Encounter (HOSPITAL_COMMUNITY): Payer: Self-pay | Admitting: Emergency Medicine

## 2013-09-07 ENCOUNTER — Emergency Department (HOSPITAL_COMMUNITY): Payer: PRIVATE HEALTH INSURANCE

## 2013-09-07 ENCOUNTER — Emergency Department (HOSPITAL_COMMUNITY)
Admission: EM | Admit: 2013-09-07 | Discharge: 2013-09-07 | Disposition: A | Discharge status: Home / Self Care | Payer: PRIVATE HEALTH INSURANCE | Attending: Emergency Medicine | Admitting: Emergency Medicine

## 2013-09-07 DIAGNOSIS — R9389 Abnormal findings on diagnostic imaging of other specified body structures: Secondary | ICD-10-CM

## 2013-09-07 DIAGNOSIS — Z792 Long term (current) use of antibiotics: Secondary | ICD-10-CM | POA: Insufficient documentation

## 2013-09-07 DIAGNOSIS — E119 Type 2 diabetes mellitus without complications: Secondary | ICD-10-CM | POA: Insufficient documentation

## 2013-09-07 DIAGNOSIS — N289 Disorder of kidney and ureter, unspecified: Secondary | ICD-10-CM | POA: Insufficient documentation

## 2013-09-07 DIAGNOSIS — Z79899 Other long term (current) drug therapy: Secondary | ICD-10-CM | POA: Insufficient documentation

## 2013-09-07 DIAGNOSIS — D649 Anemia, unspecified: Secondary | ICD-10-CM | POA: Insufficient documentation

## 2013-09-07 DIAGNOSIS — E669 Obesity, unspecified: Secondary | ICD-10-CM | POA: Insufficient documentation

## 2013-09-07 DIAGNOSIS — R062 Wheezing: Secondary | ICD-10-CM | POA: Insufficient documentation

## 2013-09-07 DIAGNOSIS — Z8709 Personal history of other diseases of the respiratory system: Secondary | ICD-10-CM | POA: Insufficient documentation

## 2013-09-07 DIAGNOSIS — I10 Essential (primary) hypertension: Secondary | ICD-10-CM | POA: Insufficient documentation

## 2013-09-07 DIAGNOSIS — R0602 Shortness of breath: Secondary | ICD-10-CM | POA: Insufficient documentation

## 2013-09-07 DIAGNOSIS — F17200 Nicotine dependence, unspecified, uncomplicated: Secondary | ICD-10-CM | POA: Insufficient documentation

## 2013-09-07 LAB — GLUCOSE, CAPILLARY: Glucose-Capillary: 119 mg/dL — ABNORMAL HIGH (ref 70–99)

## 2013-09-07 LAB — CBC WITH DIFFERENTIAL/PLATELET
Basophils Relative: 0 % (ref 0–1)
Eosinophils Relative: 0 % (ref 0–5)
HCT: 43.2 % (ref 36.0–46.0)
Hemoglobin: 13.6 g/dL (ref 12.0–15.0)
Lymphs Abs: 5 10*3/uL — ABNORMAL HIGH (ref 0.7–4.0)
MCH: 21 pg — ABNORMAL LOW (ref 26.0–34.0)
MCV: 66.7 fL — ABNORMAL LOW (ref 78.0–100.0)
Monocytes Absolute: 0.7 10*3/uL (ref 0.1–1.0)
Monocytes Relative: 6 % (ref 3–12)
Neutro Abs: 6.5 10*3/uL (ref 1.7–7.7)
RBC: 6.48 MIL/uL — ABNORMAL HIGH (ref 3.87–5.11)
WBC: 12.2 10*3/uL — ABNORMAL HIGH (ref 4.0–10.5)

## 2013-09-07 LAB — BASIC METABOLIC PANEL
BUN: 31 mg/dL — ABNORMAL HIGH (ref 6–23)
CO2: 26 mEq/L (ref 19–32)
Chloride: 96 mEq/L (ref 96–112)
Creatinine, Ser: 1.34 mg/dL — ABNORMAL HIGH (ref 0.50–1.10)
GFR calc Af Amer: 49 mL/min — ABNORMAL LOW (ref >90)
Glucose, Bld: 119 mg/dL — ABNORMAL HIGH (ref 70–99)
Potassium: 3.4 mEq/L — ABNORMAL LOW (ref 3.5–5.1)

## 2013-09-07 LAB — D-DIMER, QUANTITATIVE: D-Dimer, Quant: 0.27 ug/mL-FEU (ref 0.00–0.48)

## 2013-09-07 MED ORDER — ALBUTEROL SULFATE HFA 108 (90 BASE) MCG/ACT IN AERS: 1.0000 | INHALATION_SPRAY | Freq: Four times a day (QID) | RESPIRATORY_TRACT | Status: AC | PRN

## 2013-09-07 NOTE — ED Notes (Signed)
Patient c/o shortness of breath. Per patient took amoxicillin at 12pm today for sinus infection and at 2pm she became "lightheadness," sweating, and short of breath. Denies any chest pain. Per patient now longer "lightheaded." Patent reports EMS coming to see her after episode and doing an EKG. Per patient EMS told her that her blood pressure was elevated (180/100).

## 2013-09-07 NOTE — ED Provider Notes (Signed)
CSN: [id number]     Arrival date & time 09/07/13  1624 History   First MD Initiated Contact with Patient 09/07/13 1638  This chart was scribed for Nelia Shi, MD by Valera Castle, ED Scribe. This patient was seen in room APA10/AP[facility]10 and the patient's care was started at 4:45 PM.     Chief Complaint  Patient presents with  . Shortness of Breath  . Hypertension    The history is provided by the patient and the EMS personnel. No language interpreter was used.   HPI Comments: Susan Mahoney is a 62 y.o. female brought in by EMS who presents to the Emergency Department complaining of sudden, moderate SOB, with associated diaphoresis and light-headedness, while at work around 2:00 PM today after taking Amoxicillin at 12:00 PM. She reports when the EMS first made contact with her she could hear herself wheezing. She states she had been taking Amoxicillin for a sinus infection, and having first started on it last week per PCP. She reports h/o taking the same antibiotic before without any complications. She reports an allergic reaction to new blood pressure medication Lipitor last week, stating her lips were swollen, so they put her back on her old blood pressure medicine. She reports a h/o HTN and states that EMS did an EKG and checked her BP, after she had told them her BP was elevated to 180/100. She denies rash, chest pain, and any other associated symptoms. She reports h/o smoking .5 PPD. She denies h/o DVT and leg swelling.  PCP - Rush Barer, PA-C  Past Medical History  Diagnosis Date  . Hypertension   . Sinus infection   . Diabetes mellitus, type 2   . Obesity   . Anemia   . Tobacco abuse    Past Surgical History  Procedure Laterality Date  . Abdominal hysterectomy     Family History  Problem Relation Age of Onset  . Cancer Father    History  Substance Use Topics  . Smoking status: Current Every Day Smoker -- 0.50 packs/day for 30 years    Types: Cigarettes  . Smokeless  tobacco: Never Used  . Alcohol Use: No   OB History   Grav Para Term Preterm Abortions TAB SAB Ect Mult Living   1 1 1       1      Review of Systems A complete 10 system review of systems was obtained and all systems are negative except as noted in the HPI and PMH.   Allergies  Ace inhibitors  Home Medications   Current Outpatient Rx  Name  Route  Sig  Dispense  Refill  . acetaminophen (TYLENOL) 500 MG tablet   Oral   Take 500 mg by mouth every 6 (six) hours as needed for mild pain or moderate pain.         Marland Kitchen amoxicillin (AMOXIL) 500 MG capsule   Oral   Take 500 mg by mouth 2 (two) times daily.         Marland Kitchen FERROUS SULFATE PO   Oral   Take 5 g by mouth 2 (two) times daily.         Marland Kitchen glimepiride (AMARYL) 1 MG tablet      Take 1 tablet daily if your blood sugar is 200 and over. Further instructions per your primary care provider.   30 tablet   1   . oxymetazoline (NASAL SPRAY 12 HOUR) 0.05 % nasal spray   Each Nare   Place  1 spray into both nostrils daily as needed for congestion.         . triamterene-hydrochlorothiazide (DYAZIDE) 37.5-25 MG per capsule   Oral   Take 1 each (1 capsule total) by mouth daily.         Marland Kitchen albuterol (PROVENTIL HFA;VENTOLIN HFA) 108 (90 BASE) MCG/ACT inhaler   Inhalation   Inhale 1-2 puffs into the lungs every 6 (six) hours as needed for wheezing or shortness of breath.   1 Inhaler   0   . Cetirizine HCl 10 MG CAPS   Oral   Take 1 capsule (10 mg total) by mouth at bedtime. Restart after you've completed a three-day course of Benadryl.         . diphenhydrAMINE (BENADRYL) 25 MG tablet   Oral   Take 1 tablet (25 mg total) by mouth 2 (two) times daily. Take for 3 more days.         . famotidine (PEPCID AC) 10 MG tablet   Oral   Take 2 tablets (20 mg total) by mouth 2 (two) times daily. Take for 3 more days.         . fluconazole (DIFLUCAN) 150 MG tablet   Oral   Take 150 mg by mouth once. *may repeat as a one-time  dose in 3 days if still symptomatic*         . predniSONE (DELTASONE) 20 MG tablet   Oral   Take 1 tablet (20 mg total) by mouth daily with breakfast. Take for 3 more days.   3 tablet   0    BP 118/69  Pulse 92  Temp(Src) 97.7 F (36.5 C)  Resp 18  Ht 4\' 11"  (1.499 m)  Wt 187 lb (84.823 kg)  BMI 37.75 kg/m2  SpO2 98%  Physical Exam  Nursing note and vitals reviewed. Constitutional: She is oriented to person, place, and time. She appears well-developed and well-nourished. No distress.  HENT:  Head: Normocephalic and atraumatic.  Eyes: Pupils are equal, round, and reactive to light.  Neck: Normal range of motion.  Cardiovascular: Normal rate and intact distal pulses.   Pulmonary/Chest: No respiratory distress. She has wheezes (Minor scattered wheezes).  Abdominal: Normal appearance. She exhibits no distension. Mass: .edscribe.  Musculoskeletal: Normal range of motion.  Neurological: She is alert and oriented to person, place, and time. No cranial nerve deficit.  Skin: Skin is warm and dry. No rash noted.  Psychiatric: She has a normal mood and affect. Her behavior is normal.    ED Course  Procedures (including critical care time)  DIAGNOSTIC STUDIES: Oxygen Saturation is 98% on room air, normal by my interpretation.    COORDINATION OF CARE: 4:50 PM-Discussed treatment plan which includes CXR, D-dimer, Troponin, Chem 8, POCT CBG monitoring, and EKG with pt at bedside and pt agreed to plan.   Labs Review Labs Reviewed  GLUCOSE, CAPILLARY - Abnormal; Notable for the following:    Glucose-Capillary 119 (*)    All other components within normal limits  CBC WITH DIFFERENTIAL - Abnormal; Notable for the following:    WBC 12.2 (*)    RBC 6.48 (*)    MCV 66.7 (*)    MCH 21.0 (*)    RDW 17.7 (*)    Lymphs Abs 5.0 (*)    All other components within normal limits  BASIC METABOLIC PANEL - Abnormal; Notable for the following:    Potassium 3.4 (*)    Glucose, Bld 119 (*)  BUN 31 (*)    Creatinine, Ser 1.34 (*)    Calcium 10.6 (*)    GFR calc non Af Amer 42 (*)    GFR calc Af Amer 49 (*)    All other components within normal limits  D-DIMER, QUANTITATIVE  TROPONIN I  PARATHYROID HORMONE, INTACT (NO CA)   Imaging Review Dg Chest 2 View  09/07/2013   CLINICAL DATA:  Shortness of breath.  Intermittent dizziness.  EXAM: CHEST  2 VIEW  COMPARISON:  None.  FINDINGS: There is a 2 cm soft tissue density at the right lung apex which has a nodular appearance. This may represent an unusual tortuous brachiocephalic vessel but there are no prior studies available for comparison and this could represent a apical lung mass.  Heart size and pulmonary vascularity are normal. Slight peribronchial thickening which could be acute or chronic. No effusions or consolidative infiltrates. No acute osseous abnormality.  IMPRESSION: 1. 2 cm soft tissue density at the right apex. CT scan with contrast may be useful for further evaluation. 2. Slight peribronchial thickening which could represent acute or chronic bronchitis.   Electronically Signed   By: Geanie Cooley M.D.   On: 09/07/2013 17:50    EKG Interpretation    Date/Time:  Thursday September 07 2013 16:29:06 EST Ventricular Rate:  96 PR Interval:  148 QRS Duration: 72 QT Interval:  358 QTC Calculation: 452 R Axis:   49 Text Interpretation:  Normal sinus rhythm Biatrial enlargement Abnormal ECG No previous ECGs available Confirmed by Victormanuel Mclure  MD, Shandie Bertz ([id number]) on 09/07/2013 4:52:36 PM            Meds ordered this encounter  Medications  . amoxicillin (AMOXIL) 500 MG capsule    Sig: Take 500 mg by mouth 2 (two) times daily.  Marland Kitchen albuterol (PROVENTIL HFA;VENTOLIN HFA) 108 (90 BASE) MCG/ACT inhaler    Sig: Inhale 1-2 puffs into the lungs every 6 (six) hours as needed for wheezing or shortness of breath.    Dispense:  1 Inhaler    Refill:  0   Because of the severe renal insufficiency I will refer to her primary care  physician for further workup of the abnormal chest x-ray.  Also drew a PTH in order to further evaluate her elevated serum calcium.  This can also be followed by her primary care physician.  I will prescribe some albuterol and encourage her to stop smoking. MDM   1. Shortness of breath   2. Hypercalcemia   3. Abnormal finding on chest xray   4. Renal insufficiency         I personally performed the services described in this documentation, which was scribed in my presence. The recorded information has been reviewed and considered.   Nelia Shi, MD 09/07/13 [phone]

## 2013-09-08 LAB — PARATHYROID HORMONE, INTACT (NO CA): PTH: 97.8 pg/mL — ABNORMAL HIGH (ref 14.0–72.0)

## 2013-09-12 ENCOUNTER — Other Ambulatory Visit (HOSPITAL_COMMUNITY): Payer: Self-pay | Admitting: Family Medicine

## 2013-09-12 DIAGNOSIS — J449 Chronic obstructive pulmonary disease, unspecified: Secondary | ICD-10-CM

## 2013-09-19 ENCOUNTER — Ambulatory Visit (HOSPITAL_COMMUNITY)
Admission: RE | Admit: 2013-09-19 | Discharge: 2013-09-19 | Disposition: A | Discharge status: Home / Self Care | Payer: PRIVATE HEALTH INSURANCE | Source: Ambulatory Visit | Attending: Family Medicine | Admitting: Family Medicine

## 2013-09-19 DIAGNOSIS — J449 Chronic obstructive pulmonary disease, unspecified: Secondary | ICD-10-CM

## 2013-09-19 DIAGNOSIS — R918 Other nonspecific abnormal finding of lung field: Secondary | ICD-10-CM | POA: Insufficient documentation

## 2013-09-19 DIAGNOSIS — J984 Other disorders of lung: Secondary | ICD-10-CM | POA: Insufficient documentation

## 2013-09-19 DIAGNOSIS — J42 Unspecified chronic bronchitis: Secondary | ICD-10-CM | POA: Insufficient documentation

## 2013-09-19 DIAGNOSIS — R937 Abnormal findings on diagnostic imaging of other parts of musculoskeletal system: Secondary | ICD-10-CM | POA: Insufficient documentation

## 2014-07-23 ENCOUNTER — Encounter (HOSPITAL_COMMUNITY): Payer: Self-pay | Admitting: Emergency Medicine

## 2016-02-16 ENCOUNTER — Inpatient Hospital Stay (HOSPITAL_COMMUNITY)
Admission: EM | Admit: 2016-02-16 | Discharge: 2016-02-20 | DRG: 181 | Disposition: A | Discharge status: Home / Self Care | Payer: Medicaid Other | Attending: Internal Medicine | Admitting: Internal Medicine

## 2016-02-16 ENCOUNTER — Encounter (HOSPITAL_COMMUNITY): Payer: Self-pay | Admitting: *Deleted

## 2016-02-16 DIAGNOSIS — E1122 Type 2 diabetes mellitus with diabetic chronic kidney disease: Secondary | ICD-10-CM | POA: Diagnosis present

## 2016-02-16 DIAGNOSIS — C3412 Malignant neoplasm of upper lobe, left bronchus or lung: Principal | ICD-10-CM | POA: Diagnosis present

## 2016-02-16 DIAGNOSIS — K635 Polyp of colon: Secondary | ICD-10-CM | POA: Diagnosis present

## 2016-02-16 DIAGNOSIS — D509 Iron deficiency anemia, unspecified: Secondary | ICD-10-CM | POA: Diagnosis present

## 2016-02-16 DIAGNOSIS — K222 Esophageal obstruction: Secondary | ICD-10-CM | POA: Diagnosis present

## 2016-02-16 DIAGNOSIS — E669 Obesity, unspecified: Secondary | ICD-10-CM | POA: Diagnosis present

## 2016-02-16 DIAGNOSIS — K2100 Gastro-esophageal reflux disease with esophagitis, without bleeding: Secondary | ICD-10-CM | POA: Insufficient documentation

## 2016-02-16 DIAGNOSIS — M25512 Pain in left shoulder: Secondary | ICD-10-CM | POA: Diagnosis present

## 2016-02-16 DIAGNOSIS — D500 Iron deficiency anemia secondary to blood loss (chronic): Secondary | ICD-10-CM | POA: Diagnosis present

## 2016-02-16 DIAGNOSIS — Z800 Family history of malignant neoplasm of digestive organs: Secondary | ICD-10-CM

## 2016-02-16 DIAGNOSIS — Z90710 Acquired absence of both cervix and uterus: Secondary | ICD-10-CM

## 2016-02-16 DIAGNOSIS — Z6834 Body mass index (BMI) 34.0-34.9, adult: Secondary | ICD-10-CM

## 2016-02-16 DIAGNOSIS — N183 Chronic kidney disease, stage 3 unspecified: Secondary | ICD-10-CM | POA: Diagnosis present

## 2016-02-16 DIAGNOSIS — R252 Cramp and spasm: Secondary | ICD-10-CM | POA: Diagnosis present

## 2016-02-16 DIAGNOSIS — E1169 Type 2 diabetes mellitus with other specified complication: Secondary | ICD-10-CM

## 2016-02-16 DIAGNOSIS — D125 Benign neoplasm of sigmoid colon: Secondary | ICD-10-CM | POA: Diagnosis present

## 2016-02-16 DIAGNOSIS — F17210 Nicotine dependence, cigarettes, uncomplicated: Secondary | ICD-10-CM | POA: Diagnosis present

## 2016-02-16 DIAGNOSIS — Z86010 Personal history of colonic polyps: Secondary | ICD-10-CM | POA: Insufficient documentation

## 2016-02-16 DIAGNOSIS — D649 Anemia, unspecified: Secondary | ICD-10-CM

## 2016-02-16 DIAGNOSIS — K5730 Diverticulosis of large intestine without perforation or abscess without bleeding: Secondary | ICD-10-CM | POA: Diagnosis present

## 2016-02-16 DIAGNOSIS — Z720 Tobacco use: Secondary | ICD-10-CM | POA: Diagnosis present

## 2016-02-16 DIAGNOSIS — R0789 Other chest pain: Secondary | ICD-10-CM | POA: Diagnosis present

## 2016-02-16 DIAGNOSIS — J449 Chronic obstructive pulmonary disease, unspecified: Secondary | ICD-10-CM | POA: Diagnosis present

## 2016-02-16 DIAGNOSIS — K449 Diaphragmatic hernia without obstruction or gangrene: Secondary | ICD-10-CM | POA: Diagnosis present

## 2016-02-16 DIAGNOSIS — I129 Hypertensive chronic kidney disease with stage 1 through stage 4 chronic kidney disease, or unspecified chronic kidney disease: Secondary | ICD-10-CM | POA: Diagnosis present

## 2016-02-16 DIAGNOSIS — K922 Gastrointestinal hemorrhage, unspecified: Secondary | ICD-10-CM | POA: Diagnosis present

## 2016-02-16 DIAGNOSIS — I10 Essential (primary) hypertension: Secondary | ICD-10-CM | POA: Diagnosis present

## 2016-02-16 DIAGNOSIS — C7989 Secondary malignant neoplasm of other specified sites: Secondary | ICD-10-CM

## 2016-02-16 DIAGNOSIS — R918 Other nonspecific abnormal finding of lung field: Secondary | ICD-10-CM | POA: Diagnosis present

## 2016-02-16 MED ORDER — OXYCODONE HCL 5 MG PO TABS
5.0000 mg | ORAL_TABLET | Freq: Once | ORAL | Status: AC
  Administered 2016-02-17: 5 mg via ORAL
  Filled 2016-02-16: qty 1

## 2016-02-16 MED ORDER — DIAZEPAM 2 MG PO TABS
2.0000 mg | ORAL_TABLET | Freq: Once | ORAL | Status: AC
  Administered 2016-02-17: 2 mg via ORAL
  Filled 2016-02-16: qty 1

## 2016-02-16 NOTE — ED Notes (Signed)
Pt c/o left upper arm pain that radiates under left arm for the past month, was seen by pcp a few weeks ago, given a shot, pt states that the pain did get better for a week but always returns at night.

## 2016-02-16 NOTE — ED Provider Notes (Signed)
CSN: [id number]     Arrival date & time 02/16/16  2247 History  By signing my name below, I, Susan Mahoney, attest that this documentation has been prepared under the direction and in the presence of Varney Biles, MD. Electronically Signed: Judithann Sauger, ED Scribe. 02/16/2016. 11:51 PM.    Chief Complaint  Patient presents with  . Arm Pain   The history is provided by the patient. No language interpreter was used.   HPI Comments: Susan Mahoney is a 65 y.o. female with a hx of HTN and DM who presents to the Emergency Department complaining of gradually worsening intermittent shooting pain that starts from the left posterior shoulder pain that radiates to her lower left arm, left axilla, and left lateral chest onset approx. one month ago. Pt reports associated SOB at night. She states that the pain is worse at night and lifting something heavy makes the pain better. She adds that she has tried Tylenol and alternating ice/heat with mild relief. She explains that she was given prednisone IM by her PCP on 01/20/16 for a left lateral chest pain and left shoulder blade pain which gave her temporary relief. She denies any heart issues. She also denies any n/v, or any open wounds.    Past Medical History  Diagnosis Date  . Hypertension   . Sinus infection   . Diabetes mellitus, type 2 ([place])   . Obesity   . Anemia   . Tobacco abuse    Past Surgical History  Procedure Laterality Date  . Abdominal hysterectomy     Family History  Problem Relation Age of Onset  . Cancer Father    Social History  Substance Use Topics  . Smoking status: Current Every Day Smoker -- 0.50 packs/day for 30 years    Types: Cigarettes  . Smokeless tobacco: Never Used  . Alcohol Use: No   OB History    Gravida Para Term Preterm AB TAB SAB Ectopic Multiple Living   '1 1 1       1     '$ Review of Systems  Respiratory: Positive for shortness of breath.   Cardiovascular: Positive for chest pain.   Gastrointestinal: Negative for nausea and vomiting.  Musculoskeletal: Positive for arthralgias.  Skin: Negative for wound.  All other systems reviewed and are negative.     Allergies  Ace inhibitors and Indomethacin  Home Medications   Prior to Admission medications   Medication Sig Start Date End Date Taking? Authorizing Provider  acetaminophen (TYLENOL) 500 MG tablet Take 500 mg by mouth every 6 (six) hours as needed for mild pain or moderate pain.    Historical Provider, MD  albuterol (PROVENTIL HFA;VENTOLIN HFA) 108 (90 BASE) MCG/ACT inhaler Inhale 1-2 puffs into the lungs every 6 (six) hours as needed for wheezing or shortness of breath. 09/07/13   Leonard Schwartz, MD  amoxicillin (AMOXIL) 500 MG capsule Take 500 mg by mouth 2 (two) times daily.    Historical Provider, MD  Cetirizine HCl 10 MG CAPS Take 1 capsule (10 mg total) by mouth at bedtime. Restart after you've completed a three-day course of Benadryl. 09/03/13   Rexene Alberts, MD  diphenhydrAMINE (BENADRYL) 25 MG tablet Take 1 tablet (25 mg total) by mouth 2 (two) times daily. Take for 3 more days. 09/03/13   Rexene Alberts, MD  famotidine (PEPCID AC) 10 MG tablet Take 2 tablets (20 mg total) by mouth 2 (two) times daily. Take for 3 more days. 09/03/13   Rexene Alberts,  MD  FERROUS SULFATE PO Take 5 g by mouth 2 (two) times daily.    Historical Provider, MD  fluconazole (DIFLUCAN) 150 MG tablet Take 150 mg by mouth once. *may repeat as a one-time dose in 3 days if still symptomatic*    Historical Provider, MD  glimepiride (AMARYL) 1 MG tablet Take 1 tablet daily if your blood sugar is 200 and over. Further instructions per your primary care provider. 09/03/13   Rexene Alberts, MD  oxymetazoline (NASAL SPRAY 12 HOUR) 0.05 % nasal spray Place 1 spray into both nostrils daily as needed for congestion.    Historical Provider, MD  predniSONE (DELTASONE) 20 MG tablet Take 1 tablet (20 mg total) by mouth daily with breakfast. Take for 3  more days. 09/03/13   Rexene Alberts, MD  triamterene-hydrochlorothiazide (DYAZIDE) 37.5-25 MG per capsule Take 1 each (1 capsule total) by mouth daily. 09/03/13   Rexene Alberts, MD   BP 119/86 mmHg  Pulse 79  Temp(Src) 97.7 F (36.5 C) (Oral)  Resp 20  Ht '5\' 1"'$  (1.549 m)  Wt 181 lb (82.101 kg)  BMI 34.22 kg/m2  SpO2 97% Physical Exam  Constitutional: She is oriented to person, place, and time. She appears well-developed and well-nourished.  HENT:  Head: Normocephalic and atraumatic.  Cardiovascular: Normal rate.   Pulmonary/Chest: Effort normal.  Lungs clear to auscultation   Musculoskeletal: She exhibits tenderness.  No midline C spine tenderness No tenderness reproduce with movement of the neck.  No tenderness with internal or external ROM of shoulder No TTP of elbow joint Reproducible tenderness over the glenohumeral joint and scapula region 2+ radial pulse   Neurological: She is alert and oriented to person, place, and time.  Skin: Skin is warm and dry.  Psychiatric: She has a normal mood and affect.  Nursing note and vitals reviewed.   ED Course  Procedures (including critical care time)  CRITICAL CARE Performed by: Varney Biles   Total critical care time: 45 minutes - symptomatic anemia requiring transfusion  Critical care time was exclusive of separately billable procedures and treating other patients.  Critical care was necessary to treat or prevent imminent or life-threatening deterioration.  Critical care was time spent personally by me on the following activities: development of treatment plan with patient and/or surrogate as well as nursing, discussions with consultants, evaluation of patient's response to treatment, examination of patient, obtaining history from patient or surrogate, ordering and performing treatments and interventions, ordering and review of laboratory studies, ordering and review of radiographic studies, pulse oximetry and re-evaluation  of patient's condition.   Angiocath insertion Performed by: Varney Biles  Consent: Verbal consent obtained. Risks and benefits: risks, benefits and alternatives were discussed Time out: Immediately prior to procedure a "time out" was called to verify the correct patient, procedure, equipment, support staff and site/side marked as required.  Preparation: Patient was prepped and draped in the usual sterile fashion.  Vein Location: L antecubital fossa  Ultrasound Guided  Gauge: 20  Normal blood return and flush without difficulty Patient tolerance: Patient tolerated the procedure well with no immediate complications.     DIAGNOSTIC STUDIES: Oxygen Saturation is 100% on RA, normal by my interpretation.    COORDINATION OF CARE: 11:32 PM- Pt advised of plan for treatment and pt agrees. Pt will receive lab work and EKG for further evaluation. Will provide resources for home exercises. Advised to continue with ice or heat.    Labs Review Labs Reviewed  CBC WITH DIFFERENTIAL/PLATELET -  Abnormal; Notable for the following:    Hemoglobin 7.6 (*)    HCT 25.6 (*)    MCV 64.3 (*)    MCH 19.1 (*)    MCHC 29.7 (*)    RDW 19.2 (*)    All other components within normal limits  BASIC METABOLIC PANEL - Abnormal; Notable for the following:    BUN 51 (*)    Creatinine, Ser 1.77 (*)    GFR calc non Af Amer 29 (*)    GFR calc Af Amer 34 (*)    All other components within normal limits  POC OCCULT BLOOD, ED - Abnormal; Notable for the following:    Fecal Occult Bld POSITIVE (*)    All other components within normal limits  TSH  RETICULOCYTES  TSH  VITAMIN B12  FOLATE  IRON AND TIBC  FERRITIN  TYPE AND SCREEN    Imaging Review Dg Shoulder Left  02/17/2016  CLINICAL DATA:  Chronic worsening left posterior shoulder pain, radiating to the left lower arm and left lateral chest wall. Initial encounter. EXAM: LEFT SHOULDER - 2+ VIEW COMPARISON:  None. FINDINGS: There is no evidence of  fracture or dislocation. The left humeral head is seated within the glenoid fossa. The acromioclavicular joint is unremarkable in appearance. No significant soft tissue abnormalities are seen. There is a 6.6 cm masslike density at the left lung apex. This is concerning for malignancy, though pneumonia might have a similar appearance. IMPRESSION: 1. No evidence of fracture or dislocation. 2. 6.6 cm masslike density at the left lung apex. This is concerning for malignancy, though pneumonia might have a similar appearance. Contrast-enhanced CT of the chest is recommended for further evaluation. These results were called by telephone at the time of interpretation on 02/17/2016 at 12:51 am to Dr. Varney Biles, who verbally acknowledged these results. Electronically Signed   By: Garald Balding M.D.   On: 02/17/2016 00:51     Varney Biles, MD has personally reviewed and evaluated these images and lab results as part of his medical decision-making.   EKG Interpretation None      MDM   Final diagnoses:  Symptomatic anemia  Lung mass    I personally performed the services described in this documentation, which was scribed in my presence. The recorded information has been reviewed and is accurate.  Pt comes in with cc of arm pain. We thought the pain was coming from spasm or some sort of impingement syndrome. Pt had no neck pain. We got shoulder Xrays - reveals L sided chest mass. Pt is having L sided chest pain as well - which was atypical of cardiac pain as it too has been present with the shoulder pain for 2 months.  For the CT reasons, labs ordered. Hb came at 7.6. Pt is hemoccult +. Last Hb in 2014 was 14. She reports that she is tired, more than usual and gets winded when working. Will get type and screen. Admitting team to transfuse.  CT will be done by the admitting team as well.    Varney Biles, MD 02/17/16 [phone]

## 2016-02-17 ENCOUNTER — Emergency Department (HOSPITAL_COMMUNITY): Payer: Medicaid Other

## 2016-02-17 ENCOUNTER — Encounter (HOSPITAL_COMMUNITY): Payer: Self-pay | Admitting: Internal Medicine

## 2016-02-17 ENCOUNTER — Observation Stay (HOSPITAL_COMMUNITY): Payer: Medicaid Other

## 2016-02-17 DIAGNOSIS — R918 Other nonspecific abnormal finding of lung field: Secondary | ICD-10-CM | POA: Diagnosis present

## 2016-02-17 DIAGNOSIS — E661 Drug-induced obesity: Secondary | ICD-10-CM

## 2016-02-17 DIAGNOSIS — E669 Obesity, unspecified: Secondary | ICD-10-CM | POA: Diagnosis present

## 2016-02-17 DIAGNOSIS — C3412 Malignant neoplasm of upper lobe, left bronchus or lung: Secondary | ICD-10-CM | POA: Diagnosis present

## 2016-02-17 DIAGNOSIS — Z90710 Acquired absence of both cervix and uterus: Secondary | ICD-10-CM | POA: Diagnosis not present

## 2016-02-17 DIAGNOSIS — K922 Gastrointestinal hemorrhage, unspecified: Secondary | ICD-10-CM | POA: Diagnosis present

## 2016-02-17 DIAGNOSIS — F17210 Nicotine dependence, cigarettes, uncomplicated: Secondary | ICD-10-CM | POA: Diagnosis present

## 2016-02-17 DIAGNOSIS — D500 Iron deficiency anemia secondary to blood loss (chronic): Secondary | ICD-10-CM | POA: Diagnosis present

## 2016-02-17 DIAGNOSIS — K222 Esophageal obstruction: Secondary | ICD-10-CM | POA: Diagnosis present

## 2016-02-17 DIAGNOSIS — D509 Iron deficiency anemia, unspecified: Secondary | ICD-10-CM | POA: Diagnosis present

## 2016-02-17 DIAGNOSIS — C7989 Secondary malignant neoplasm of other specified sites: Secondary | ICD-10-CM | POA: Diagnosis not present

## 2016-02-17 DIAGNOSIS — K449 Diaphragmatic hernia without obstruction or gangrene: Secondary | ICD-10-CM | POA: Diagnosis present

## 2016-02-17 DIAGNOSIS — D125 Benign neoplasm of sigmoid colon: Secondary | ICD-10-CM | POA: Diagnosis present

## 2016-02-17 DIAGNOSIS — N183 Chronic kidney disease, stage 3 (moderate): Secondary | ICD-10-CM | POA: Diagnosis present

## 2016-02-17 DIAGNOSIS — M25512 Pain in left shoulder: Secondary | ICD-10-CM | POA: Diagnosis present

## 2016-02-17 DIAGNOSIS — K5730 Diverticulosis of large intestine without perforation or abscess without bleeding: Secondary | ICD-10-CM | POA: Diagnosis present

## 2016-02-17 DIAGNOSIS — K635 Polyp of colon: Secondary | ICD-10-CM | POA: Diagnosis present

## 2016-02-17 DIAGNOSIS — K921 Melena: Secondary | ICD-10-CM

## 2016-02-17 DIAGNOSIS — Z800 Family history of malignant neoplasm of digestive organs: Secondary | ICD-10-CM | POA: Diagnosis not present

## 2016-02-17 DIAGNOSIS — Z6834 Body mass index (BMI) 34.0-34.9, adult: Secondary | ICD-10-CM | POA: Diagnosis not present

## 2016-02-17 DIAGNOSIS — E1122 Type 2 diabetes mellitus with diabetic chronic kidney disease: Secondary | ICD-10-CM | POA: Diagnosis present

## 2016-02-17 DIAGNOSIS — I129 Hypertensive chronic kidney disease with stage 1 through stage 4 chronic kidney disease, or unspecified chronic kidney disease: Secondary | ICD-10-CM | POA: Diagnosis present

## 2016-02-17 DIAGNOSIS — R0789 Other chest pain: Secondary | ICD-10-CM | POA: Diagnosis present

## 2016-02-17 DIAGNOSIS — D649 Anemia, unspecified: Secondary | ICD-10-CM | POA: Diagnosis present

## 2016-02-17 DIAGNOSIS — R252 Cramp and spasm: Secondary | ICD-10-CM | POA: Diagnosis present

## 2016-02-17 DIAGNOSIS — J449 Chronic obstructive pulmonary disease, unspecified: Secondary | ICD-10-CM | POA: Diagnosis present

## 2016-02-17 DIAGNOSIS — K210 Gastro-esophageal reflux disease with esophagitis: Secondary | ICD-10-CM | POA: Diagnosis present

## 2016-02-17 LAB — CBC WITH DIFFERENTIAL/PLATELET
BASOS ABS: 0 10*3/uL (ref 0.0–0.1)
BASOS PCT: 0 %
EOS ABS: 0.3 10*3/uL (ref 0.0–0.7)
Eosinophils Relative: 3 %
HCT: 25.6 % — ABNORMAL LOW (ref 36.0–46.0)
Hemoglobin: 7.6 g/dL — ABNORMAL LOW (ref 12.0–15.0)
Lymphocytes Relative: 34 %
Lymphs Abs: 3.3 10*3/uL (ref 0.7–4.0)
MCH: 19.1 pg — ABNORMAL LOW (ref 26.0–34.0)
MCHC: 29.7 g/dL — AB (ref 30.0–36.0)
MCV: 64.3 fL — ABNORMAL LOW (ref 78.0–100.0)
MONO ABS: 0.7 10*3/uL (ref 0.1–1.0)
MONOS PCT: 8 %
NEUTROS PCT: 55 %
Neutro Abs: 5.3 10*3/uL (ref 1.7–7.7)
Platelets: 366 10*3/uL (ref 150–400)
RBC: 3.98 MIL/uL (ref 3.87–5.11)
RDW: 19.2 % — ABNORMAL HIGH (ref 11.5–15.5)
WBC: 9.5 10*3/uL (ref 4.0–10.5)

## 2016-02-17 LAB — BASIC METABOLIC PANEL
Anion gap: 8 (ref 5–15)
BUN: 51 mg/dL — AB (ref 6–20)
CO2: 24 mmol/L (ref 22–32)
CREATININE: 1.77 mg/dL — AB (ref 0.44–1.00)
Calcium: 9.3 mg/dL (ref 8.9–10.3)
Chloride: 105 mmol/L (ref 101–111)
GFR, EST AFRICAN AMERICAN: 34 mL/min — AB (ref >60)
GFR, EST NON AFRICAN AMERICAN: 29 mL/min — AB (ref >60)
Glucose, Bld: 84 mg/dL (ref 65–99)
Potassium: 3.9 mmol/L (ref 3.5–5.1)
SODIUM: 137 mmol/L (ref 135–145)

## 2016-02-17 LAB — IRON AND TIBC
IRON: 15 ug/dL — AB (ref 28–170)
SATURATION RATIOS: 5 % — AB (ref 10.4–31.8)
TIBC: 325 ug/dL (ref 250–450)
UIBC: 310 ug/dL

## 2016-02-17 LAB — GLUCOSE, CAPILLARY
GLUCOSE-CAPILLARY: 91 mg/dL (ref 65–99)
GLUCOSE-CAPILLARY: 143 mg/dL — AB (ref 65–99)
GLUCOSE-CAPILLARY: 204 mg/dL — AB (ref 65–99)
GLUCOSE-CAPILLARY: 129 mg/dL — AB (ref 65–99)

## 2016-02-17 LAB — RETICULOCYTES
RBC.: 3.62 MIL/uL — ABNORMAL LOW (ref 3.87–5.11)
RETIC CT PCT: 2 % (ref 0.4–3.1)
Retic Count, Absolute: 72.4 10*3/uL (ref 19.0–186.0)

## 2016-02-17 LAB — VITAMIN B12: Vitamin B-12: 233 pg/mL (ref 180–914)

## 2016-02-17 LAB — TSH: TSH: 1.989 u[IU]/mL (ref 0.350–4.500)

## 2016-02-17 LAB — HEMOGLOBIN AND HEMATOCRIT, BLOOD
HCT: 34.1 % — ABNORMAL LOW (ref 36.0–46.0)
Hemoglobin: 10.5 g/dL — ABNORMAL LOW (ref 12.0–15.0)

## 2016-02-17 LAB — PREPARE RBC (CROSSMATCH)

## 2016-02-17 LAB — FERRITIN: Ferritin: 22 ng/mL (ref 11–307)

## 2016-02-17 LAB — FOLATE: Folate: 17.6 ng/mL

## 2016-02-17 LAB — POC OCCULT BLOOD, ED: FECAL OCCULT BLD: POSITIVE — AB

## 2016-02-17 MED ORDER — PEG 3350-KCL-NA BICARB-NACL 420 G PO SOLR
2.0000 L | Freq: Once | ORAL | Status: AC
  Administered 2016-02-18: 2000 mL via ORAL
  Filled 2016-02-17: qty 4000

## 2016-02-17 MED ORDER — SODIUM CHLORIDE 0.9 % IV BOLUS (SEPSIS)
1000.0000 mL | Freq: Once | INTRAVENOUS | Status: AC
  Administered 2016-02-17: 1000 mL via INTRAVENOUS

## 2016-02-17 MED ORDER — PANTOPRAZOLE SODIUM 40 MG IV SOLR
80.0000 mg | Freq: Once | INTRAVENOUS | Status: AC
  Administered 2016-02-17: 80 mg via INTRAVENOUS
  Filled 2016-02-17: qty 80

## 2016-02-17 MED ORDER — HYDROMORPHONE HCL 1 MG/ML IJ SOLN
1.0000 mg | INTRAMUSCULAR | Status: DC | PRN
  Administered 2016-02-17 – 2016-02-20 (×10): 1 mg via INTRAVENOUS
  Filled 2016-02-17 (×10): qty 1

## 2016-02-17 MED ORDER — FLEET ENEMA 7-19 GM/118ML RE ENEM: 2.0000 | ENEMA | Freq: Once | RECTAL | Status: DC

## 2016-02-17 MED ORDER — SODIUM CHLORIDE 0.9 % IV SOLN: Freq: Once | INTRAVENOUS | Status: AC

## 2016-02-17 MED ORDER — IOPAMIDOL (ISOVUE-300) INJECTION 61%
60.0000 mL | Freq: Once | INTRAVENOUS | Status: AC | PRN
  Administered 2016-02-17: 60 mL via INTRAVENOUS

## 2016-02-17 MED ORDER — DEXTROSE-NACL 5-0.9 % IV SOLN
INTRAVENOUS | Status: DC
  Administered 2016-02-18 – 2016-02-19 (×2): via INTRAVENOUS

## 2016-02-17 MED ORDER — SODIUM CHLORIDE 0.9 % IV SOLN: Freq: Once | INTRAVENOUS | Status: DC

## 2016-02-17 MED ORDER — PEG 3350-KCL-NA BICARB-NACL 420 G PO SOLR
2.0000 L | Freq: Once | ORAL | Status: AC
  Administered 2016-02-17: 2000 mL via ORAL
  Filled 2016-02-17: qty 4000

## 2016-02-17 MED ORDER — PANTOPRAZOLE SODIUM 40 MG IV SOLR
40.0000 mg | Freq: Two times a day (BID) | INTRAVENOUS | Status: DC
  Administered 2016-02-17 – 2016-02-20 (×6): 40 mg via INTRAVENOUS
  Filled 2016-02-17 (×6): qty 40

## 2016-02-17 MED ORDER — ONDANSETRON HCL 4 MG/2ML IJ SOLN
INTRAMUSCULAR | Status: AC
  Filled 2016-02-17: qty 2

## 2016-02-17 MED ORDER — ONDANSETRON HCL 4 MG/2ML IJ SOLN
4.0000 mg | INTRAMUSCULAR | Status: DC | PRN
  Administered 2016-02-17: 4 mg via INTRAVENOUS

## 2016-02-17 MED ORDER — INSULIN ASPART 100 UNIT/ML ~~LOC~~ SOLN
0.0000 [IU] | Freq: Three times a day (TID) | SUBCUTANEOUS | Status: DC
  Administered 2016-02-17 (×2): 2 [IU] via SUBCUTANEOUS
  Administered 2016-02-18: 5 [IU] via SUBCUTANEOUS
  Administered 2016-02-20 (×2): 2 [IU] via SUBCUTANEOUS

## 2016-02-17 MED ORDER — INSULIN ASPART 100 UNIT/ML ~~LOC~~ SOLN: 0.0000 [IU] | Freq: Every day | SUBCUTANEOUS | Status: DC

## 2016-02-17 MED ORDER — FLEET ENEMA 7-19 GM/118ML RE ENEM
2.0000 | ENEMA | Freq: Once | RECTAL | Status: AC
  Administered 2016-02-18: 2 via RECTAL

## 2016-02-17 NOTE — ED Notes (Signed)
Lab at bedside for blood work.

## 2016-02-17 NOTE — ED Notes (Signed)
Multiple attempts used for iv placement with ultrasound without success.

## 2016-02-17 NOTE — Consult Note (Signed)
Referring Provider: No ref. provider found Primary Care Physician:  [facility] Primary Gastroenterologist:  Dr. Gala Romney  Reason for Consultation:  Anemia; Hemoccult positive stool  HPI: Pleasant 65 year old lady from [facility] admitted to the hospital yesterday after she presented for further evaluation of weakness and left shoulder pain. Plain films revealed a nearly 7 cm left apical lung mass concerning for carcinoma. Long-term smoker.  Also, profoundly anemic with microcytic indices and Hemoccult positive stool. Stools chronically dark on iron therapy. Denies hematochezia or change in bowel function. Denies abdominal pain.  No upper GIs tract symptoms such as odynophagia, dysphagia or reflux symptoms; denies early satiety or recent weight loss.  Record states she has taken Pepcid in the past but patient denies any history of gastrointestinal illness.  She has never had a colonoscopy citing lack of insurance. Family history is notable in that her father succumbed to colorectal cancer at age 72   Past Medical History  Diagnosis Date  . Hypertension   . Sinus infection   . Diabetes mellitus, type 2 ([place])   . Obesity   . Anemia   . Tobacco abuse     Past Surgical History  Procedure Laterality Date  . Abdominal hysterectomy      Prior to Admission medications   Medication Sig Start Date End Date Taking? Authorizing Provider  acetaminophen (TYLENOL) 500 MG tablet Take 500 mg by mouth every 6 (six) hours as needed for mild pain or moderate pain.    Historical Provider, MD  albuterol (PROVENTIL HFA;VENTOLIN HFA) 108 (90 BASE) MCG/ACT inhaler Inhale 1-2 puffs into the lungs every 6 (six) hours as needed for wheezing or shortness of breath. 09/07/13   Leonard Schwartz, MD  amoxicillin (AMOXIL) 500 MG capsule Take 500 mg by mouth 2 (two) times daily.    Historical Provider, MD  Cetirizine HCl 10 MG CAPS Take 1 capsule (10 mg total) by mouth at bedtime.  Restart after you've completed a three-day course of Benadryl. 09/03/13   Rexene Alberts, MD  diphenhydrAMINE (BENADRYL) 25 MG tablet Take 1 tablet (25 mg total) by mouth 2 (two) times daily. Take for 3 more days. 09/03/13   Rexene Alberts, MD  famotidine (PEPCID AC) 10 MG tablet Take 2 tablets (20 mg total) by mouth 2 (two) times daily. Take for 3 more days. 09/03/13   Rexene Alberts, MD  FERROUS SULFATE PO Take 5 g by mouth 2 (two) times daily.    Historical Provider, MD  fluconazole (DIFLUCAN) 150 MG tablet Take 150 mg by mouth once. *may repeat as a one-time dose in 3 days if still symptomatic*    Historical Provider, MD  glimepiride (AMARYL) 1 MG tablet Take 1 tablet daily if your blood sugar is 200 and over. Further instructions per your primary care provider. 09/03/13   Rexene Alberts, MD  oxymetazoline (NASAL SPRAY 12 HOUR) 0.05 % nasal spray Place 1 spray into both nostrils daily as needed for congestion.    Historical Provider, MD  predniSONE (DELTASONE) 20 MG tablet Take 1 tablet (20 mg total) by mouth daily with breakfast. Take for 3 more days. 09/03/13   Rexene Alberts, MD  triamterene-hydrochlorothiazide (DYAZIDE) 37.5-25 MG per capsule Take 1 each (1 capsule total) by mouth daily. 09/03/13   Rexene Alberts, MD    Current Facility-Administered Medications  Medication Dose Route Frequency Provider Last Rate Last Dose  . 0.9 %  sodium chloride infusion   Intravenous Once Orvan Falconer, MD      .  dextrose 5 %-0.9 % sodium chloride infusion   Intravenous Continuous Orvan Falconer, MD      . HYDROmorphone (DILAUDID) injection 1 mg  1 mg Intravenous Q4H PRN Orvan Falconer, MD   1 mg at 02/17/16 0533  . insulin aspart (novoLOG) injection 0-15 Units  0-15 Units Subcutaneous TID WC Orvan Falconer, MD   0 Units at 02/17/16 0800  . insulin aspart (novoLOG) injection 0-5 Units  0-5 Units Subcutaneous QHS Orvan Falconer, MD      . pantoprazole (PROTONIX) injection 40 mg  40 mg Intravenous Q12H Orvan Falconer, MD        Allergies as of  02/16/2016 - Review Complete 02/16/2016  Allergen Reaction Noted  . Ace inhibitors Swelling 09/02/2013  . Indomethacin  02/16/2016    Family History  Problem Relation Age of Onset  . Cancer Father     Social History   Social History  . Marital Status: Married    Spouse Name: N/A  . Number of Children: N/A  . Years of Education: N/A   Occupational History  . Not on file.   Social History Main Topics  . Smoking status: Current Every Day Smoker -- 0.50 packs/day for 30 years    Types: Cigarettes  . Smokeless tobacco: Never Used  . Alcohol Use: No  . Drug Use: No  . Sexual Activity: Yes    Birth Control/ Protection: Surgical   Other Topics Concern  . Not on file   Social History Narrative    Review of Systems: Gen: Denies any fever, chills, sweats, anorexia, fatigue, weakness, malaise, weight loss, and sleep disorder CV: Denies chest pain, angina, palpitations, syncope, orthopnea, PND, peripheral edema, and claudication. GII: Denies vomiting blood, jaundice, and fecal incontinence.   Denies dysphagia or odynophagia. Derm: Denies rash, itching, dry skin, hives, moles, warts, or unhealing ulcers.  As in history of present illness   Physical Exam: Vital signs in last 24 hours: Temp:  [97.7 F (36.5 C)-98.9 F (37.2 C)] 98.4 F (36.9 C) (05/29 9562) Pulse Rate:  [66-92] 66 (05/29 0632) Resp:  [16-20] 16 (05/29 0632) BP: (87-119)/(63-86) 87/65 mmHg (05/29 0632) SpO2:  [96 %-100 %] 96 % (05/29 1308) Weight:  [181 lb (82.101 kg)] 181 lb (82.101 kg) (05/28 2257)   General:   pleasant and cooperative in NAD. Conversant and oriented. Head:  Normocephalic and atraumatic. Neck:  Supple; no masses or thyromegaly. Lungs:  Clear throughout to auscultation.   No wheezes, crackles, or rhonchi. No acute distress. Heart:  Regular rate and rhythm; no murmurs, clicks, rubs,  or gallops. Abdomen:  Obese. Positive bowel sounds soft and nontender without obvious mass or  organomegaly.  Rectal:  Deferred until time of colonoscopy.     Intake/Output from previous day:   Intake/Output this shift: Total I/O In: 490 [P.O.:240; I.V.:250] Out: -   Lab Results:  Recent Labs  02/17/16 0226  WBC 9.5  HGB 7.6*  HCT 25.6*  PLT 366   BMET  Recent Labs  02/17/16 0226  NA 137  K 3.9  CL 105  CO2 24  GLUCOSE 84  BUN 51*  CREATININE 1.77*  CALCIUM 9.3  Studies/Results: Dg Shoulder Left  02/17/2016  CLINICAL DATA:  Chronic worsening left posterior shoulder pain, radiating to the left lower arm and left lateral chest wall. Initial encounter. EXAM: LEFT SHOULDER - 2+ VIEW COMPARISON:  None. FINDINGS: There is no evidence of fracture or dislocation. The left humeral head is seated within the glenoid fossa. The  acromioclavicular joint is unremarkable in appearance. No significant soft tissue abnormalities are seen. There is a 6.6 cm masslike density at the left lung apex. This is concerning for malignancy, though pneumonia might have a similar appearance. IMPRESSION: 1. No evidence of fracture or dislocation. 2. 6.6 cm masslike density at the left lung apex. This is concerning for malignancy, though pneumonia might have a similar appearance. Contrast-enhanced CT of the chest is recommended for further evaluation. These results were called by telephone at the time of interpretation on 02/17/2016 at 12:51 am to Dr. Varney Biles, who verbally acknowledged these results. Electronically Signed   By: Garald Balding M.D.   On: 02/17/2016 00:51    Impression:  Pleasant 65 year old lady admitted to the hospital with progressive weakness,  left shoulder pain, left apical pulmonary mass suspicious for carcinoma, profound microcytic anemia and Hemoccult-positive stool.  She reports hemoptysis. She denies any specific GI symptoms. No prior GI evaluation. No prior colonoscopy. Positive family history of colon cancer in a first degree relative less than 60.  I agree, profound  microcytic anemia and Hemoccult-positive stool report further GI evaluation.  Recommendations: In this setting, I have offered the patient both diagnostic EGD and colonoscopy tomorrow. The risks, benefits, limitations, imponderables and alternatives regarding both EGD and colonoscopy have been reviewed with the patient. Questions have been answered. All parties agreeable. Agree with empiric PPI for the time being.  Further recommendations to follow.     Notice:  This dictation was prepared with Dragon dictation along with smaller phrase technology. Any transcriptional errors that result from this process are unintentional and may not be corrected upon review.

## 2016-02-17 NOTE — Progress Notes (Signed)
PROGRESS NOTE    Susan Mahoney  WOE:[id number] DOB: 1950-11-07 DOA: 02/16/2016 PCP: [facility]   Brief Narrative: 65 y.o. female with hx of DM, Obesity, HTN, tobacco abuse, presented to the ER with left shoulder pain which she has had for several weeks, worse tonight. She has no chest pain or neurological symptoms. During the ER evaluation, she had Xray of her left shoulder, showing no Fx or dislocation, but incidentally found to have a large apical lung mass (6.6cm) suspicious for malignancy. She was also found to have an anemia with Hb of 7.6 grams per dL, with microcytic indices. ROS now showed that she has slight hemoptysis over past couple of months, but had no evidence of GI Bleed. She had no abdominal pain, weight loss, or black stool. She now admitted to having some weakness, but no SOB or CP. Stool was subsequently checked, and found OB positive. [place] hadn't had insurance, [place] She never had colonoscopy and had more than 30 years of tobacco abuse. Hospitalist is asked to admit her for symptomatic anemia and left apical mass work up   Assessment & Plan:   Principal Problem:   Anemia Active Problems:   Essential hypertension   Diabetes mellitus type 2 in obese (HCC)   Morbid obesity ([place])   Tobacco abuse   Lung mass   Anemia:  - Likely subacute or chronic.  - Could be GI bleed given positive OB, or from chronic hemoptysis.  - Transfused 2 units of PRBCs. - Obtain anemia panel.  - Consulted GI.Plans for EGD and colonoscopy on 5/30  Left apical lung mass:  - Possibly results in her left shoulder pain.  - Given her hx of tobacco use, there are concerns about malignancy.  - Consulted pulmonary.  - Will obtain CT chest with contrast.   HTN: - Remains stable.  - Follow.  - Diuretics on hold  DM2:  - Will place on carb modified diet.  - Continue SSI. Holding amaryl.   DVT  prophylaxis: SCD's Code Status: Full Family Communication: Pt in room, husband at bedside Disposition Plan: Uncertain at this time   Consultants:   Gastroenterology  Pulmonary  Procedures:     Antimicrobials: Anti-infectives    None           Subjective: No complaints currently. Patient denies abdominal pain.  Objective: Filed Vitals:   02/17/16 0632 02/17/16 0945 02/17/16 1015 02/17/16 1230  BP: 87/65 110/58 110/60 107/54  Pulse: 66 65 79 52  Temp: 98.4 F (36.9 C) 98.4 F (36.9 C) 98.4 F (36.9 C) 97.9 F (36.6 C)  TempSrc:      Resp: '16 18 18 18  '$ Height:      Weight:      SpO2: 96% 99% 100% 98%    Intake/Output Summary (Last 24 hours) at 02/17/16 1254 Last data filed at 02/17/16 1250  Gross per 24 hour  Intake   1190 ml  Output      0 ml  Net   1190 ml   Filed Weights   02/16/16 2257  Weight: 82.101 kg (181 lb)    Examination:  General exam: Appears calm and comfortable, Lying in bed  Respiratory system: Clear to auscultation. Respiratory effort normal. Cardiovascular system: S1 & S2 heard, RRR Gastrointestinal system: Abdomen is nondistended, soft and nontender. No organomegaly or masses felt. Normal bowel sounds heard. Central nervous system: Alert and oriented. No focal neurological deficits.  Extremities: Symmetric 5 x 5 power. Skin: No rashes, lesions Psychiatry: Judgement and insight appear normal. Mood & affect appropriate.     Data Reviewed: I have personally reviewed following labs and imaging studies  CBC:  Recent Labs Lab 02/17/16 0226  WBC 9.5  NEUTROABS 5.3  HGB 7.6*  HCT 25.6*  MCV 64.3*  PLT 696   Basic Metabolic Panel:  Recent Labs Lab 02/17/16 0226  NA 137  K 3.9  CL 105  CO2 24  GLUCOSE 84  BUN 51*  CREATININE 1.77*  CALCIUM 9.3   GFR: Estimated Creatinine Clearance: 31.6 mL/min (by C-G formula based on Cr of 1.77). Liver Function Tests: No results for input(s): AST, ALT, ALKPHOS, BILITOT, PROT,  ALBUMIN in the last 168 hours. No results for input(s): LIPASE, AMYLASE in the last 168 hours. No results for input(s): AMMONIA in the last 168 hours. Coagulation Profile: No results for input(s): INR, PROTIME in the last 168 hours. Cardiac Enzymes: No results for input(s): CKTOTAL, CKMB, CKMBINDEX, TROPONINI in the last 168 hours. BNP (last 3 results) No results for input(s): PROBNP in the last 8760 hours. HbA1C: No results for input(s): HGBA1C in the last 72 hours. CBG:  Recent Labs Lab 02/17/16 0724 02/17/16 1125  GLUCAP 91 143*   Lipid Profile: No results for input(s): CHOL, HDL, LDLCALC, TRIG, CHOLHDL, LDLDIRECT in the last 72 hours. Thyroid Function Tests:  Recent Labs  02/17/16 0752  TSH 1.989   Anemia Panel:  Recent Labs  02/17/16 0752  RETICCTPCT 2.0   Sepsis Labs: No results for input(s): PROCALCITON, LATICACIDVEN in the last 168 hours.  No results found for this or any previous visit (from the past 240 hour(s)).       Radiology Studies: Dg Shoulder Left  02/17/2016  CLINICAL DATA:  Chronic worsening left posterior shoulder pain, radiating to the left lower arm and left lateral chest wall. Initial encounter. EXAM: LEFT SHOULDER - 2+ VIEW COMPARISON:  None. FINDINGS: There is no evidence of fracture or dislocation. The left humeral head is seated within the glenoid fossa. The acromioclavicular joint is unremarkable in appearance. No significant soft tissue abnormalities are seen. There is a 6.6 cm masslike density at the left lung apex. This is concerning for malignancy, though pneumonia might have a similar appearance. IMPRESSION: 1. No evidence of fracture or dislocation. 2. 6.6 cm masslike density at the left lung apex. This is concerning for malignancy, though pneumonia might have a similar appearance. Contrast-enhanced CT of the chest is recommended for further evaluation. These results were called by telephone at the time of interpretation on 02/17/2016 at  12:51 am to Dr. Varney Biles, who verbally acknowledged these results. Electronically Signed   By: Garald Balding M.D.   On: 02/17/2016 00:51        Scheduled Meds: . sodium chloride   Intravenous Once  . insulin aspart  0-15 Units Subcutaneous TID WC  . insulin aspart  0-5 Units Subcutaneous QHS  . pantoprazole (PROTONIX) IV  40 mg Intravenous Q12H   Continuous Infusions: . dextrose 5 % and 0.9% NaCl         CHIU, Orpah Melter, MD [facility] Pager [phone]   If 7PM-7AM, please contact night-coverage [email or web address] Password [facility] 02/17/2016, 12:54 PM

## 2016-02-17 NOTE — H&P (Signed)
[facility] History and Physical  CARLEN REBUCK ERD:[id number] DOB: May 01, 1951    PCP:   [facility]   Chief Complaint: left shoulder pain, found incidentally to have anemia and lung mass.   HPI: Susan Mahoney is an 65 y.o. female with hx of DM, Obesity, HTN, tobacco abuse, presented to the ER with left shoulder pain which she has had for several weeks, worse tonight.  She has no chest pain or neurological symptoms.  During the ER evaluation, she had Xray of her left shoulder, showing no Fx or dislocation, but incidentally found to have a large apical lung mass (6.6cm) suspicious for malignancy.  She was also found to have an anemia with Hb of 7.6 grams per dL, with microcytic indices.  ROS now showed that she has slight hemoptysis over past couple of months, but had no evidence of GI Bleed.  She had no abdominal pain, weight loss, or black stool.  She now admitted to having some weakness, but no SOB or CP.  Stool was subsequently checked, and found OB positive.  She hadn't had insurance, [place]  She never had colonoscopy and had more than 30 years of tobacco abuse.  Hospitalist is asked to admit her for symptomatic anemia and left apical mass work up.   Rewiew of Systems:  Constitutional: Negative for malaise, fever and chills. No significant weight loss or weight gain Eyes: Negative for eye pain, redness and discharge, diplopia, visual changes, or flashes of light. ENMT: Negative for ear pain, hoarseness, nasal congestion, sinus pressure and sore throat. No headaches; tinnitus, drooling, or problem swallowing. Cardiovascular: Negative for chest pain, palpitations, diaphoresis, dyspnea and peripheral edema. ; No orthopnea, PND Respiratory: Negative for cough, hemoptysis, wheezing and stridor. No pleuritic chestpain. Gastrointestinal: Negative for nausea, vomiting, diarrhea, constipation, abdominal pain, melena, blood in  stool, hematemesis, jaundice and rectal bleeding.    Genitourinary: Negative for frequency, dysuria, incontinence,flank pain and hematuria; Musculoskeletal: Negative for back pain and neck pain. Negative for swelling and trauma.;  Skin: . Negative for pruritus, rash, abrasions, bruising and skin lesion.; ulcerations Neuro: Negative for headache, lightheadedness and neck stiffness. Negative for weakness, altered level of consciousness , altered mental status, extremity weakness, burning feet, involuntary movement, seizure and syncope.  Psych: negative for anxiety, depression, insomnia, tearfulness, panic attacks, hallucinations, paranoia, suicidal or homicidal ideation    Past Medical History  Diagnosis Date  . Hypertension   . Sinus infection   . Diabetes mellitus, type 2 ([place])   . Obesity   . Anemia   . Tobacco abuse     Past Surgical History  Procedure Laterality Date  . Abdominal hysterectomy      Medications:  HOME MEDS: Prior to Admission medications   Medication Sig Start Date End Date Taking? Authorizing Provider  acetaminophen (TYLENOL) 500 MG tablet Take 500 mg by mouth every 6 (six) hours as needed for mild pain or moderate pain.    Historical Provider, MD  albuterol (PROVENTIL HFA;VENTOLIN HFA) 108 (90 BASE) MCG/ACT inhaler Inhale 1-2 puffs into the lungs every 6 (six) hours as needed for wheezing or shortness of breath. 09/07/13   Leonard Schwartz, MD  amoxicillin (AMOXIL) 500 MG capsule Take 500 mg by mouth 2 (two) times daily.    Historical Provider, MD  Cetirizine HCl 10 MG CAPS Take 1 capsule (10 mg total) by mouth at bedtime. Restart after you've completed a three-day course of Benadryl. 09/03/13  Rexene Alberts, MD  diphenhydrAMINE (BENADRYL) 25 MG tablet Take 1 tablet (25 mg total) by mouth 2 (two) times daily. Take for 3 more days. 09/03/13   Rexene Alberts, MD  famotidine (PEPCID AC) 10 MG tablet Take 2 tablets (20 mg total) by mouth 2 (two) times daily. Take for 3  more days. 09/03/13   Rexene Alberts, MD  FERROUS SULFATE PO Take 5 g by mouth 2 (two) times daily.    Historical Provider, MD  fluconazole (DIFLUCAN) 150 MG tablet Take 150 mg by mouth once. *may repeat as a one-time dose in 3 days if still symptomatic*    Historical Provider, MD  glimepiride (AMARYL) 1 MG tablet Take 1 tablet daily if your blood sugar is 200 and over. Further instructions per your primary care provider. 09/03/13   Rexene Alberts, MD  oxymetazoline (NASAL SPRAY 12 HOUR) 0.05 % nasal spray Place 1 spray into both nostrils daily as needed for congestion.    Historical Provider, MD  predniSONE (DELTASONE) 20 MG tablet Take 1 tablet (20 mg total) by mouth daily with breakfast. Take for 3 more days. 09/03/13   Rexene Alberts, MD  triamterene-hydrochlorothiazide (DYAZIDE) 37.5-25 MG per capsule Take 1 each (1 capsule total) by mouth daily. 09/03/13   Rexene Alberts, MD     Allergies:  Allergies  Allergen Reactions  . Ace Inhibitors Swelling  . Indomethacin     Swelling     Social History:   reports that she has been smoking Cigarettes.  She has a 15 pack-year smoking history. She has never used smokeless tobacco. She reports that she does not drink alcohol or use illicit drugs.  Family History: Family History  Problem Relation Age of Onset  . Cancer Father      Physical Exam: Filed Vitals:   02/16/16 2257 02/17/16 0359  BP: 118/70 98/63  Pulse: 92 78  Temp: 98.9 F (37.2 C) 97.7 F (36.5 C)  TempSrc: Oral Oral  Resp: 18 20  Height: '5\' 1"'$  (1.549 m)   Weight: 82.101 kg (181 lb)   SpO2: 100% 100%   Blood pressure 98/63, pulse 78, temperature 97.7 F (36.5 C), temperature source Oral, resp. rate 20, height '5\' 1"'$  (1.549 m), weight 82.101 kg (181 lb), SpO2 100 %.  GEN:  Pleasant  patient lying in the stretcher in no acute distress; cooperative with exam. PSYCH:  alert and oriented x4; does not appear anxious or depressed; affect is appropriate. HEENT: Mucous  membranes pink and anicteric; PERRLA; EOM intact; no cervical lymphadenopathy nor thyromegaly or carotid bruit; no JVD; There were no stridor. Neck is very supple. Breasts:: Not examined CHEST WALL: No tenderness CHEST: Normal respiration, clear to auscultation bilaterally.  HEART: Regular rate and rhythm.  There are no murmur, rub, or gallops.   BACK: No kyphosis or scoliosis; no CVA tenderness ABDOMEN: soft and non-tender; no masses, no organomegaly, normal abdominal bowel sounds; no pannus; no intertriginous candida. There is no rebound and no distention. Rectal Exam: Not done EXTREMITIES: No bone or joint deformity; age-appropriate arthropathy of the hands and knees; no edema; no ulcerations.  There is no calf tenderness. Genitalia: not examined PULSES: 2+ and symmetric SKIN: Normal hydration no rash or ulceration CNS: Cranial nerves 2-12 grossly intact no focal lateralizing neurologic deficit.  Speech is fluent; uvula elevated with phonation, facial symmetry and tongue midline. DTR are normal bilaterally, cerebella exam is intact, barbinski is negative and strengths are equaled bilaterally.  No sensory loss.   Labs on  Admission:  Basic Metabolic Panel:  Recent Labs Lab 02/17/16 0226  NA 137  K 3.9  CL 105  CO2 24  GLUCOSE 84  BUN 51*  CREATININE 1.77*  CALCIUM 9.3   Liver Function Tests: CBC:  Recent Labs Lab 02/17/16 0226  WBC 9.5  NEUTROABS 5.3  HGB 7.6*  HCT 25.6*  MCV 64.3*  PLT 366    Radiological Exams on Admission: Dg Shoulder Left  02/17/2016  CLINICAL DATA:  Chronic worsening left posterior shoulder pain, radiating to the left lower arm and left lateral chest wall. Initial encounter. EXAM: LEFT SHOULDER - 2+ VIEW COMPARISON:  None. FINDINGS: There is no evidence of fracture or dislocation. The left humeral head is seated within the glenoid fossa. The acromioclavicular joint is unremarkable in appearance. No significant soft tissue abnormalities are seen.  There is a 6.6 cm masslike density at the left lung apex. This is concerning for malignancy, though pneumonia might have a similar appearance. IMPRESSION: 1. No evidence of fracture or dislocation. 2. 6.6 cm masslike density at the left lung apex. This is concerning for malignancy, though pneumonia might have a similar appearance. Contrast-enhanced CT of the chest is recommended for further evaluation. These results were called by telephone at the time of interpretation on 02/17/2016 at 12:51 am to Dr. Varney Biles, who verbally acknowledged these results. Electronically Signed   By: Garald Balding M.D.   On: 02/17/2016 00:51    EKG: Independently reviewed.   Assessment/Plan Present on Admission:  . Anemia . Lung mass . Morbid obesity ([place]) . Tobacco abuse . Essential hypertension  PLAN:  Anemia:  Likely subacute or chronic.  Could be GI bleed given positive OB, or from chronic hemoptysis.  Will transfuse 2 units of PRBCs.  Obtain anemia panel.  Will consult GI.  Will admit her to regular medical floor.  Follow Hct and start PPI empirically.  Left apical lung mass:  Likely explained her left shoulder pain.  Given her hx of tobacco use, I am concerned about malignancy.  Will consult pulmonary as well.  Obtain CT chest with contrast.   HTN:  Stable.  Follow.  Hold diuretics.  DM2:  Will place on carb modified diet.  SSI.  Hold amaryl.    Other plans as per orders.  Trinna Post, MD. FACP [facility] Pager [phone] 7pm to 7am.  02/17/2016, 5:16 AM

## 2016-02-17 NOTE — ED Notes (Signed)
Dr Kathrynn Humble at bedside,

## 2016-02-18 ENCOUNTER — Encounter (HOSPITAL_COMMUNITY)
Admission: EM | Disposition: A | Discharge status: Home / Self Care | Payer: Self-pay | Source: Home / Self Care | Attending: Internal Medicine

## 2016-02-18 ENCOUNTER — Encounter (HOSPITAL_COMMUNITY): Payer: Self-pay

## 2016-02-18 DIAGNOSIS — R195 Other fecal abnormalities: Secondary | ICD-10-CM

## 2016-02-18 DIAGNOSIS — D120 Benign neoplasm of cecum: Secondary | ICD-10-CM

## 2016-02-18 DIAGNOSIS — D122 Benign neoplasm of ascending colon: Secondary | ICD-10-CM

## 2016-02-18 DIAGNOSIS — Z860100 Personal history of colon polyps, unspecified: Secondary | ICD-10-CM | POA: Insufficient documentation

## 2016-02-18 DIAGNOSIS — K2100 Gastro-esophageal reflux disease with esophagitis, without bleeding: Secondary | ICD-10-CM | POA: Insufficient documentation

## 2016-02-18 DIAGNOSIS — D123 Benign neoplasm of transverse colon: Secondary | ICD-10-CM

## 2016-02-18 DIAGNOSIS — K5730 Diverticulosis of large intestine without perforation or abscess without bleeding: Secondary | ICD-10-CM | POA: Insufficient documentation

## 2016-02-18 DIAGNOSIS — K222 Esophageal obstruction: Secondary | ICD-10-CM

## 2016-02-18 DIAGNOSIS — D125 Benign neoplasm of sigmoid colon: Secondary | ICD-10-CM

## 2016-02-18 DIAGNOSIS — D649 Anemia, unspecified: Secondary | ICD-10-CM | POA: Insufficient documentation

## 2016-02-18 DIAGNOSIS — K449 Diaphragmatic hernia without obstruction or gangrene: Secondary | ICD-10-CM

## 2016-02-18 HISTORY — PX: COLONOSCOPY: SHX5424

## 2016-02-18 HISTORY — PX: ESOPHAGOGASTRODUODENOSCOPY: SHX5428

## 2016-02-18 LAB — BASIC METABOLIC PANEL
ANION GAP: 5 (ref 5–15)
BUN: 34 mg/dL — ABNORMAL HIGH (ref 6–20)
CALCIUM: 8.8 mg/dL — AB (ref 8.9–10.3)
CO2: 24 mmol/L (ref 22–32)
Chloride: 110 mmol/L (ref 101–111)
Creatinine, Ser: 1.43 mg/dL — ABNORMAL HIGH (ref 0.44–1.00)
GFR, EST AFRICAN AMERICAN: 44 mL/min — AB (ref >60)
GFR, EST NON AFRICAN AMERICAN: 38 mL/min — AB (ref >60)
GLUCOSE: 101 mg/dL — AB (ref 65–99)
POTASSIUM: 4 mmol/L (ref 3.5–5.1)
Sodium: 139 mmol/L (ref 135–145)

## 2016-02-18 LAB — TYPE AND SCREEN
ABO/RH(D): O POS
ANTIBODY SCREEN: NEGATIVE
Unit division: 0
Unit division: 0

## 2016-02-18 LAB — CBC
HEMATOCRIT: 30 % — AB (ref 36.0–46.0)
HEMOGLOBIN: 9.6 g/dL — AB (ref 12.0–15.0)
MCH: 22.3 pg — ABNORMAL LOW (ref 26.0–34.0)
MCHC: 32 g/dL (ref 30.0–36.0)
MCV: 69.8 fL — ABNORMAL LOW (ref 78.0–100.0)
Platelets: 310 10*3/uL (ref 150–400)
RBC: 4.3 MIL/uL (ref 3.87–5.11)
RDW: 22.8 % — ABNORMAL HIGH (ref 11.5–15.5)
WBC: 9.5 10*3/uL (ref 4.0–10.5)

## 2016-02-18 LAB — GLUCOSE, CAPILLARY
GLUCOSE-CAPILLARY: 113 mg/dL — AB (ref 65–99)
GLUCOSE-CAPILLARY: 138 mg/dL — AB (ref 65–99)
Glucose-Capillary: 214 mg/dL — ABNORMAL HIGH (ref 65–99)
Glucose-Capillary: 117 mg/dL — ABNORMAL HIGH (ref 65–99)

## 2016-02-18 LAB — PROTIME-INR
INR: 1.05 (ref 0.00–1.49)
PROTHROMBIN TIME: 13.9 s (ref 11.6–15.2)

## 2016-02-18 LAB — APTT: APTT: 28 s (ref 24–37)

## 2016-02-18 SURGERY — EGD (ESOPHAGOGASTRODUODENOSCOPY)
Anesthesia: Moderate Sedation

## 2016-02-18 MED ORDER — LIDOCAINE VISCOUS 2 % MT SOLN
OROMUCOSAL | Status: AC
  Filled 2016-02-18: qty 15

## 2016-02-18 MED ORDER — MEPERIDINE HCL 100 MG/ML IJ SOLN
INTRAMUSCULAR | Status: DC | PRN
  Administered 2016-02-18: 25 mg via INTRAVENOUS
  Administered 2016-02-18: 50 mg via INTRAVENOUS
  Administered 2016-02-18: 25 mg via INTRAVENOUS

## 2016-02-18 MED ORDER — STERILE WATER FOR IRRIGATION IR SOLN
Status: DC | PRN
  Administered 2016-02-18: 11:00:00

## 2016-02-18 MED ORDER — SODIUM CHLORIDE 0.9 % IV SOLN: INTRAVENOUS | Status: DC

## 2016-02-18 MED ORDER — ONDANSETRON HCL 4 MG/2ML IJ SOLN
INTRAMUSCULAR | Status: DC | PRN
  Administered 2016-02-18: 4 mg via INTRAVENOUS

## 2016-02-18 MED ORDER — ONDANSETRON HCL 4 MG/2ML IJ SOLN
INTRAMUSCULAR | Status: AC
  Filled 2016-02-18: qty 2

## 2016-02-18 MED ORDER — MIDAZOLAM HCL 5 MG/5ML IJ SOLN
INTRAMUSCULAR | Status: DC | PRN
  Administered 2016-02-18 (×4): 1 mg via INTRAVENOUS
  Administered 2016-02-18: 2 mg via INTRAVENOUS
  Administered 2016-02-18: 1 mg via INTRAVENOUS

## 2016-02-18 MED ORDER — LIDOCAINE VISCOUS 2 % MT SOLN
OROMUCOSAL | Status: DC | PRN
  Administered 2016-02-18: 3 mL via OROMUCOSAL

## 2016-02-18 MED ORDER — MIDAZOLAM HCL 5 MG/5ML IJ SOLN
INTRAMUSCULAR | Status: AC
  Filled 2016-02-18: qty 10

## 2016-02-18 MED ORDER — MEPERIDINE HCL 100 MG/ML IJ SOLN
INTRAMUSCULAR | Status: AC
  Filled 2016-02-18: qty 2

## 2016-02-18 MED ORDER — SODIUM CHLORIDE 0.9 % IV SOLN
INTRAVENOUS | Status: DC
  Administered 2016-02-18: 11:00:00 via INTRAVENOUS

## 2016-02-18 NOTE — Progress Notes (Signed)
Spoke with primary nurse. Patient was sleeping this morning and night shift did not awaken patient for prep; however, Audrea Muscat, day shift nurse, noted this early during the shift and has already started on second 2 liters. Patient doing well with this, with improvement in stool color and nearing clear. Will be appropriate for colonoscopy and EGD today.  Orvil Feil, ANP-BC [facility]

## 2016-02-18 NOTE — Care Management Note (Signed)
Case Management Note  Patient Details  Name: Susan Mahoney MRN: [id number] Date of Birth: [birth year]  Subjective/Objective : Patient is from home with husband, She is independent with ADL's. She has no issues getting to her appointments. Patient reported no issues getting meds, however does not have insurance, will consult financial counselor.                Action/Plan: No CM needs identified at this time, will cont. To follow.    Expected Discharge Date:  02/19/16               Expected Discharge Plan:  Home/Self Care  In-House Referral:  NA  Discharge planning Services  CM Consult  Post Acute Care Choice:  NA Choice offered to:  NA  DME Arranged:    DME Agency:     HH Arranged:    HH Agency:     Status of Service:  Completed, signed off  Medicare Important Message Given:    Date Medicare IM Given:    Medicare IM give by:    Date Additional Medicare IM Given:    Additional Medicare Important Message give by:     If discussed at [place] of Stay Meetings, dates discussed:    Additional Comments:  Kanyla Omeara, Chauncey Reading, RN 02/18/2016, 9:43 AM

## 2016-02-18 NOTE — Op Note (Signed)
[facility] Patient Name: Susan Mahoney Procedure Date: 02/18/2016 11:11 AM MRN: [id number] Date of Birth: 04/18/1951 Attending MD: Norvel Richards , MD CSN: [id number] Age: 65 Admit Type: Outpatient Procedure:                Upper GI endoscopy daignostic Indications:              Heme positive stool - microcytic anemia. Pulmonary                            mass. "Hemoptysis". Providers:                Norvel Richards, MD, Gwenlyn Fudge, RN, Georgeann Oppenheim, Technician Referring MD:              Medicines:                Midazolam 4 mg IV, Meperidine 75 mg IV, Ondansetron                            4 mg IV Complications:            No immediate complications. Estimated Blood Loss:     Estimated blood loss: none. Procedure:                Pre-Anesthesia Assessment:                           - Prior to the procedure, a History and Physical                            was performed, and patient medications and                            allergies were reviewed. The patient's tolerance of                            previous anesthesia was also reviewed. The risks                            and benefits of the procedure and the sedation                            options and risks were discussed with the patient.                            All questions were answered, and informed consent                            was obtained. Prior Anticoagulants: The patient has                            taken no previous anticoagulant or antiplatelet  agents. ASA Grade Assessment: III - A patient with                            severe systemic disease. After reviewing the risks                            and benefits, the patient was deemed in                            satisfactory condition to undergo the procedure.                           After obtaining informed consent, the endoscope was                            passed under  direct vision. Throughout the                            procedure, the patient's blood pressure, pulse, and                            oxygen saturations were monitored continuously. The                            [id number] ([id number]) scope was introduced through the                            mouth, and advanced to the second part of duodenum.                            The upper GI endoscopy was accomplished without                            difficulty. The patient tolerated the procedure                            well. Scope In: 11:20:27 AM Scope Out: 11:25:54 AM Total Procedure Duration: 0 hours 5 minutes 27 seconds  Findings:      LA Grade A (one or more mucosal breaks less than 5 mm, not extending       between tops of 2 mucosal folds) esophagitis was found 33 to 34 cm from       the incisors.      A non-obstructing Schatzki ring (acquired) was found in the lower third       of the esophagus.      A medium-sized hiatal hernia was present.      The cardia and gastric fundus were normal on retroflexion.      The second portion of the duodenum was normal. Impression:               - LA Grade A esophagitis.                           - Non-obstructing Schatzki ring.                           -  Medium-sized hiatal hernia.                           - Normal second portion of the duodenum.                           - No specimens collected. Moderate Sedation:      Moderate (conscious) sedation was administered by the endoscopy nurse       and supervised by the endoscopist. The following parameters were       monitored: oxygen saturation, heart rate, blood pressure, respiratory       rate, EKG, adequacy of pulmonary ventilation, and response to care.       Total physician intraservice time was 38 minutes. Recommendation:           - Return patient to hospital ward for ongoing care.                           - Advance diet as tolerated today.                           - Continue present  medications including PPI once                            daily..                           - No repeat upper endoscopy.                           - Return to GI office after studies are complete.                            See colonoscopy report. Procedure Code(s):        --- Professional ---                           [phone], Esophagogastroduodenoscopy, flexible,                            transoral; diagnostic, including collection of                            specimen(s) by brushing or washing, when performed                            (separate procedure) Diagnosis Code(s):        --- Professional ---                           K20.9, Esophagitis, unspecified                           K22.2, Esophageal obstruction                           K44.9, Diaphragmatic hernia without obstruction or  gangrene                           R19.5, Other fecal abnormalities CPT copyright 2016 [employer]. All rights reserved. The codes documented in this report are preliminary and upon coder review may  be revised to meet current compliance requirements. Cristopher Estimable. Nayellie Sanseverino, MD Norvel Richards, MD 02/18/2016 11:57:27 AM This report has been signed electronically. Number of Addenda: 0

## 2016-02-18 NOTE — Progress Notes (Signed)
PROGRESS NOTE    Susan Mahoney  IWL:[id number] DOB: [birth year] DOA: 02/16/2016 PCP: [facility]   Brief Narrative: 65 y.o. female with hx of DM, Obesity, HTN, tobacco abuse, presented to the ER with left shoulder pain which she has had for several weeks, worse tonight. She has no chest pain or neurological symptoms. During the ER evaluation, she had Xray of her left shoulder, showing no Fx or dislocation, but incidentally found to have a large apical lung mass (6.6cm) suspicious for malignancy. She was also found to have an anemia with Hb of 7.6 grams per dL, with microcytic indices. ROS now showed that she has slight hemoptysis over past couple of months, but had no evidence of GI Bleed. She had no abdominal pain, weight loss, or black stool. She now admitted to having some weakness, but no SOB or CP. Stool was subsequently checked, and found OB positive. She hadn't had insurance, and [place] She never had colonoscopy and had more than 30 years of tobacco abuse. Hospitalist is asked to admit her for symptomatic anemia and left apical mass work up   Assessment & Plan:   Principal Problem:   Anemia Active Problems:   Essential hypertension   Diabetes mellitus type 2 in obese (HCC)   Morbid obesity ([place])   Tobacco abuse   Lung mass   Anemia:  - Likely subacute or chronic.  - Could be GI bleed given positive OB, or from chronic hemoptysis.  - Transfused 2 units of PRBCs thus far - Patient is iron deficient - Consulted GI.Plans for EGD and colonoscopy on today - follow up findings - Continue to follow serial CBC  Left apical lung mass:  - Possibly results in her left shoulder pain.  - Given her hx of tobacco use, there are concerns about malignancy.  - Consulted pPlmonary.  - CT chest notable for 9.6 x 6.1 x 6.6 cm mass of the upper left lobe with evidence of direct chest wall invasion. There is also a  smoothly marginated pleural-based lesion in the apex of the right hemithorax concerns are for metastatic disease. Interventional radiology has been consulted for consideration of biopsy. This has been scheduled for 02/19/2016.  HTN: - Remains stable.  - Follow.  - Diuretics on hold  DM2:  - Will place on carb modified diet.  - Continue SSI. Holding amaryl.   DVT prophylaxis: SCD's Code Status: Full Family Communication: Pt in room, family at bedside Disposition Plan: Uncertain at this time   Consultants:   Gastroenterology  Pulmonary  Interventional radiology  Procedures:     Antimicrobials: Anti-infectives    None          Subjective: Patient is currently without complaints.  Objective: Filed Vitals:   02/18/16 1130 02/18/16 1135 02/18/16 1140 02/18/16 1145  BP: 117/69 139/75 148/91 152/89  Pulse: 92 97 95 97  Temp:      TempSrc:      Resp: '20 21 23 18  '$ Height:      Weight:      SpO2: 99% 95% 100% 98%    Intake/Output Summary (Last 24 hours) at 02/18/16 1149 Last data filed at 02/18/16 0957  Gross per 24 hour  Intake    950 ml  Output   1251 ml  Net   -301 ml   Filed Weights   02/16/16 2257  Weight: 82.101 kg (181 lb)    Examination:  General exam: Appears  calm and comfortable, Lying in bed, conversant and pleasant  Respiratory system: Clear to auscultation. Respiratory effort normal. Cardiovascular system: S1 & S2 heard, RRR Gastrointestinal system: Abdomen is nondistended, soft and nontender. No organomegaly or masses felt. Normal bowel sounds heard. Central nervous system: Alert and oriented. No focal neurological deficits. Extremities: Symmetric 5 x 5 power. Skin: No rashes, lesions Psychiatry: Judgement and insight appear normal. Normal mood and affect.     Data Reviewed: I have personally reviewed following labs and imaging studies  CBC:  Recent Labs Lab 02/17/16 0226 02/17/16 1901 02/18/16 0522  WBC 9.5  --  9.5    NEUTROABS 5.3  --   --   HGB 7.6* 10.5* 9.6*  HCT 25.6* 34.1* 30.0*  MCV 64.3*  --  69.8*  PLT 366  --  694   Basic Metabolic Panel:  Recent Labs Lab 02/17/16 0226 02/18/16 0522  NA 137 139  K 3.9 4.0  CL 105 110  CO2 24 24  GLUCOSE 84 101*  BUN 51* 34*  CREATININE 1.77* 1.43*  CALCIUM 9.3 8.8*   GFR: Estimated Creatinine Clearance: 39.1 mL/min (by C-G formula based on Cr of 1.43). Liver Function Tests: No results for input(s): AST, ALT, ALKPHOS, BILITOT, PROT, ALBUMIN in the last 168 hours. No results for input(s): LIPASE, AMYLASE in the last 168 hours. No results for input(s): AMMONIA in the last 168 hours. Coagulation Profile:  Recent Labs Lab 02/18/16 1000  INR 1.05   Cardiac Enzymes: No results for input(s): CKTOTAL, CKMB, CKMBINDEX, TROPONINI in the last 168 hours. BNP (last 3 results) No results for input(s): PROBNP in the last 8760 hours. HbA1C: No results for input(s): HGBA1C in the last 72 hours. CBG:  Recent Labs Lab 02/17/16 1125 02/17/16 1614 02/17/16 2036 02/18/16 0736 02/18/16 1109  GLUCAP 143* 204* 129* 113* 138*   Lipid Profile: No results for input(s): CHOL, HDL, LDLCALC, TRIG, CHOLHDL, LDLDIRECT in the last 72 hours. Thyroid Function Tests:  Recent Labs  02/17/16 0752  TSH 1.989   Anemia Panel:  Recent Labs  02/17/16 0752  VITAMINB12 233  FOLATE 17.6  FERRITIN 22  TIBC 325  IRON 15*  RETICCTPCT 2.0   Sepsis Labs: No results for input(s): PROCALCITON, LATICACIDVEN in the last 168 hours.  No results found for this or any previous visit (from the past 240 hour(s)).       Radiology Studies: Ct Chest W Contrast  02/17/2016  CLINICAL DATA:  65 year old female with possible lung mass noted on recent left shoulder radiographs. EXAM: CT CHEST WITH CONTRAST TECHNIQUE: Multidetector CT imaging of the chest was performed during intravenous contrast administration. CONTRAST:  75m ISOVUE-300 IOPAMIDOL (ISOVUE-300) INJECTION  61% COMPARISON:  Chest CT 09/19/2013. Left shoulder radiograph 02/17/2016. FINDINGS: Mediastinum/Lymph Nodes: Heart size is mildly enlarged. There is no significant pericardial fluid, thickening or pericardial calcification. No pathologically enlarged mediastinal or hilar lymph nodes. Esophagus is unremarkable in appearance. No axillary lymphadenopathy. Multiple heterogeneous appearing thyroid nodules, largest of which measures 2.0 x 1.1 cm in the left lobe of the thyroid gland. Lungs/Pleura: In the left upper lobe there is a macrolobulated mass measuring 9.6 x 6.1 x 6.6 cm (axial image 32 of series 2 and coronal image 81 of series 4). This lesion makes a broad with the overlying pleura and clearly demonstrates direct chest wall invasion in the first and second intercostal spaces. There is a tiny focus of cavitation in the superior aspect of the lesion (image 22 of series 2). Adjacent  to the lesion in the medial aspect of the left upper lobe there is extensive ground-glass attenuation and septal thickening, which could reflect some lymphangitic spread of disease, postobstructive changes secondary to extrinsic mass effect upon left upper lobe bronchi, or may simply reflect perilesional hemorrhage. In the apex of the right hemithorax there is a pleural-based lesion which is centrally low-attenuation (18 HU), which is larger than prior study 09/19/2013, currently measuring 3.7 x 2.6 x 2.7 cm (previously 2.2 x 1.6 x 1.1 cm). This lesion remains smoothly marginated. No pleural effusions. Upper Abdomen: Low-attenuation lesions right kidney compatible with simple cysts, measuring up to 1.4 cm in the upper pole. Musculoskeletal/Soft Tissues: There are no aggressive appearing lytic or blastic lesions noted in the visualized portions of the skeleton. IMPRESSION: 1. 9.6 x 6.1 x 6.6 cm macrolobulated mass in the left upper lobe with evidence of direct chest wall invasion, and surrounding opacities in the left upper lobe which  are favored to reflect some early lymphangitic spread of disease (but may alternatively simply reflect postobstructive changes or perilesional hemorrhage). No mediastinal or hilar lymphadenopathy noted at this time. Further evaluation with biopsy and/or PET-CT is recommended in the near future for diagnostic and staging purposes. 2. In addition, there is a smoothly marginated pleural-based lesion in the apex of the right hemithorax which is slightly larger than prior study 09/19/2013 and is low-attenuation, suggesting a cystic lesion. This is favored to be benign (potentially a neurenteric cyst or benign peripheral nerve sheath tumor), but attention at the time of follow-up PET-CT is suggested. 3. Mild cardiomegaly. 4. Additional incidental findings, as above. Electronically Signed   By: Vinnie Langton M.D.   On: 02/17/2016 18:54   Dg Shoulder Left  02/17/2016  CLINICAL DATA:  Chronic worsening left posterior shoulder pain, radiating to the left lower arm and left lateral chest wall. Initial encounter. EXAM: LEFT SHOULDER - 2+ VIEW COMPARISON:  None. FINDINGS: There is no evidence of fracture or dislocation. The left humeral head is seated within the glenoid fossa. The acromioclavicular joint is unremarkable in appearance. No significant soft tissue abnormalities are seen. There is a 6.6 cm masslike density at the left lung apex. This is concerning for malignancy, though pneumonia might have a similar appearance. IMPRESSION: 1. No evidence of fracture or dislocation. 2. 6.6 cm masslike density at the left lung apex. This is concerning for malignancy, though pneumonia might have a similar appearance. Contrast-enhanced CT of the chest is recommended for further evaluation. These results were called by telephone at the time of interpretation on 02/17/2016 at 12:51 am to Dr. Varney Biles, who verbally acknowledged these results. Electronically Signed   By: Garald Balding M.D.   On: 02/17/2016 00:51         Scheduled Meds: . [MAR Hold] sodium chloride   Intravenous Once  . [MAR Hold] insulin aspart  0-15 Units Subcutaneous TID WC  . [MAR Hold] insulin aspart  0-5 Units Subcutaneous QHS  . lidocaine      . meperidine      . midazolam      . ondansetron      . [MAR Hold] pantoprazole (PROTONIX) IV  40 mg Intravenous Q12H   Continuous Infusions: . sodium chloride    . sodium chloride 20 mL/hr at 02/18/16 1051  . dextrose 5 % and 0.9% NaCl       LOS: 1 day   Hezakiah Champeau, Orpah Melter, MD [facility] Pager [phone]   If 7PM-7AM, please contact night-coverage  [email or web address] Password [email or web address] 02/18/2016, 11:49 AM

## 2016-02-18 NOTE — Progress Notes (Addendum)
Patient ID: Susan Mahoney, female   DOB: 1951/05/05, 65 y.o.   MRN: [id number]   Request for left lung mass vs Left rib/chest wall lesion biopsy  Dr Vernard Gambles has reviewed imaging and approves procedure  Plan for 5/31 [facility] RN will hear from IR PA for all orders and needs   ADDENDUM:  Have spoken to RN 1130 am 5/30  Pt to arrive [facility] 5/31 via ambulance NPO Maintain IV To return to [facility] after procedure

## 2016-02-18 NOTE — Op Note (Signed)
[facility] Patient Name: Susan Mahoney Procedure Date: 02/18/2016 11:29 AM MRN: [id number] Date of Birth: 1951-05-19 Attending MD: Norvel Richards , MD CSN: [id number] Age: 65 Admit Type: Outpatient Procedure:                Colonoscopy with multiple snare polypectomy Indications:              Heme positive stool, Iron deficiency anemia Providers:                Norvel Richards, MD, Gwenlyn Fudge, RN, Georgeann Oppenheim, Technician Referring MD:              Medicines:                Midazolam 7 mg IV, Meperidine 100 mg IV,                            Ondansetron 4 mg IV Complications:            No immediate complications. Estimated Blood Loss:     Estimated blood loss was minimal. Procedure:                Pre-Anesthesia Assessment:                           - Prior to the procedure, a History and Physical                            was performed, and patient medications and                            allergies were reviewed. The patient's tolerance of                            previous anesthesia was also reviewed. The risks                            and benefits of the procedure and the sedation                            options and risks were discussed with the patient.                            All questions were answered, and informed consent                            was obtained. Prior Anticoagulants: The patient has                            taken no previous anticoagulant or antiplatelet                            agents. ASA Grade Assessment: III - A patient with  severe systemic disease. After reviewing the risks                            and benefits, the patient was deemed in                            satisfactory condition to undergo the procedure.                           - Prior to the procedure, a History and Physical                            was performed, and patient medications and                      allergies were reviewed. The patient's tolerance of                            previous anesthesia was also reviewed. The risks                            and benefits of the procedure and the sedation                            options and risks were discussed with the patient.                            All questions were answered, and informed consent                            was obtained. Prior Anticoagulants: The patient has                            taken no previous anticoagulant or antiplatelet                            agents. ASA Grade Assessment: III - A patient with                            severe systemic disease. After reviewing the risks                            and benefits, the patient was deemed in                            satisfactory condition to undergo the procedure.                           After obtaining informed consent, the colonoscope                            was passed under direct vision. Throughout the  procedure, the patient's blood pressure, pulse, and                            oxygen saturations were monitored continuously. The                            EC-[id number] ([id number]) scope was introduced through                            the anus and advanced to the the cecum, identified                            by appendiceal orifice and ileocecal valve. The                            colonoscopy was performed without difficulty. The                            patient tolerated the procedure well. The quality                            of the bowel preparation was adequate. The                            ileocecal valve, appendiceal orifice, and rectum                            were photographed. The entire colon was visualized. Scope In: 11:32:32 AM Scope Out: 11:54:09 AM Scope Withdrawal Time: 0 hours 15 minutes 32 seconds  Total Procedure Duration: 0 hours 21 minutes 37 seconds  Findings:      The  perianal and digital rectal examinations were normal.      Multiple medium-mouthed diverticula were found in the entire colon.      Five semi-pedunculated polyps were found in the sigmoid colon, splenic       flexure, ascending colon and cecum. The polyps were 3 to 8 mm in size.       These polyps were removed with a cold snare. Resection and retrieval       were complete. Estimated blood loss was minimal.      The exam was otherwise without abnormality on direct and retroflexion       views. Impression:               - Diverticulosis in the entire examined colon.                           - Five 3 to 8 mm polyps in the sigmoid colon, at                            the splenic flexure, in the ascending colon and in                            the cecum, removed with a cold snare. Resected and  retrieved.                           - The examination was otherwise normal on direct                            and retroflexion views. Moderate Sedation:      Moderate (conscious) sedation was administered by the endoscopy nurse       and supervised by the endoscopist. The following parameters were       monitored: oxygen saturation, heart rate, blood pressure, respiratory       rate, EKG, adequacy of pulmonary ventilation, and response to care.       Total physician intraservice time was 38 minutes. Recommendation:           - Return patient to hospital ward for ongoing care.                           - Resume previous diet.                           - Continue present medications.                           - Repeat colonoscopy date to be determined after                            pending pathology results are reviewed for                            surveillance based on pathology results.                           - Return to GI office (date not yet determined).                            See EGD report. Proceed with evaluation and                            management  of large apical left pulonary lesion. Procedure Code(s):        --- Professional ---                           [phone], Colonoscopy, flexible; with removal of                            tumor(s), polyp(s), or other lesion(s) by snare                            technique                           99152, Moderate sedation services provided by the                            same physician or other qualified health care  professional performing the diagnostic or                            therapeutic service that the sedation supports,                            requiring the presence of an independent trained                            observer to assist in the monitoring of the                            patient's level of consciousness and physiological                            status; initial 15 minutes of intraservice time,                            patient age 30 years or older                           [phone], Moderate sedation services; each additional                            15 minutes intraservice time                           [phone], Moderate sedation services; each additional                            15 minutes intraservice time Diagnosis Code(s):        --- Professional ---                           D12.5, Benign neoplasm of sigmoid colon                           D12.3, Benign neoplasm of transverse colon (hepatic                            flexure or splenic flexure)                           D12.2, Benign neoplasm of ascending colon                           D12.0, Benign neoplasm of cecum                           R19.5, Other fecal abnormalities                           D50.9, Iron deficiency anemia, unspecified                           K57.30, Diverticulosis of large intestine without  perforation or abscess without bleeding CPT copyright 2016 [employer]. All rights reserved. The codes documented  in this report are preliminary and upon coder review may  be revised to meet current compliance requirements. Cristopher Estimable. Rourk, MD Norvel Richards, MD 02/18/2016 12:04:32 PM This report has been signed electronically. Number of Addenda: 0

## 2016-02-18 NOTE — Consult Note (Signed)
Consult requested by: [facility] Consult requested for abnormal chest CT:  HPI: This is a 65 year old who has history of COPD. She says she's been coughing for several weeks. She started having chest and shoulder pain at about the same time. Her pain got worse and she came to the emergency department and during her evaluation she had an x-ray of her shoulder that did not show any acute injury to her shoulder but did show a large apical lung mass. In addition to that she was found to have a hemoglobin of 7.6. She's not had any bleeding that she is aware of although she has had some hemoptysis. Her stool has been occult blood positive. CT of the chest shows a large mass in the left upper lobe that appears to be invading the chest wall. There is also a smooth pleural-based lesion in the right and this is suggestive of a cyst. Her anemia and GI bleeding is being worked up.  Past Medical History  Diagnosis Date  . Hypertension   . Sinus infection   . Diabetes mellitus, type 2 ([place])   . Obesity   . Anemia   . Tobacco abuse      Family History  Problem Relation Age of Onset  . Cancer Father      Social History   Social History  . Marital Status: Married    Spouse Name: N/A  . Number of Children: N/A  . Years of Education: N/A   Social History Main Topics  . Smoking status: Current Every Day Smoker -- 0.50 packs/day for 30 years    Types: Cigarettes  . Smokeless tobacco: Never Used  . Alcohol Use: No  . Drug Use: No  . Sexual Activity: Yes    Birth Control/ Protection: Surgical   Other Topics Concern  . None   Social History Narrative     ROS: She has had some hemoptysis. She's had chest pain. She has left shoulder pain. She's not had any fever or chills. Otherwise per the history and physical which I have reviewed    Objective: Vital signs in last 24 hours: Temp:  [97.9 F (36.6 C)-98.6 F (37 C)] 98.2 F (36.8 C) (05/30 0558) Pulse Rate:  [52-79] 76 (05/30  0558) Resp:  [18-20] 20 (05/30 0558) BP: (107-142)/(53-72) 117/67 mmHg (05/30 0558) SpO2:  [95 %-100 %] 95 % (05/30 0558) Weight change:  Last BM Date: 02/16/16  Intake/Output from previous day: 05/29 0701 - 05/30 0700 In: 1990 [P.O.:840; I.V.:450; Blood:700] Out: 1251 [Urine:1250; Stool:1]  PHYSICAL EXAM She is somewhat obese. She does not appear to be in any acute distress. Her chest shows clear. Her heart is regular without gallop. Pupils are reactive nodes and throat clear mucous membranes are moist her neck is supple and I don't feel any lymphadenopathy. Her abdomen is soft without masses. She has no clubbing of the digits. Her central nervous system examination is grossly intact  Lab Results: Basic Metabolic Panel:  Recent Labs  02/17/16 0226 02/18/16 0522  NA 137 139  K 3.9 4.0  CL 105 110  CO2 24 24  GLUCOSE 84 101*  BUN 51* 34*  CREATININE 1.77* 1.43*  CALCIUM 9.3 8.8*   Liver Function Tests: No results for input(s): AST, ALT, ALKPHOS, BILITOT, PROT, ALBUMIN in the last 72 hours. No results for input(s): LIPASE, AMYLASE in the last 72 hours. No results for input(s): AMMONIA in the last 72 hours. CBC:  Recent Labs  02/17/16 0226 02/17/16 1901 02/18/16  0522  WBC 9.5  --  9.5  NEUTROABS 5.3  --   --   HGB 7.6* 10.5* 9.6*  HCT 25.6* 34.1* 30.0*  MCV 64.3*  --  69.8*  PLT 366  --  310   Cardiac Enzymes: No results for input(s): CKTOTAL, CKMB, CKMBINDEX, TROPONINI in the last 72 hours. BNP: No results for input(s): PROBNP in the last 72 hours. D-Dimer: No results for input(s): DDIMER in the last 72 hours. CBG:  Recent Labs  02/17/16 0724 02/17/16 1125 02/17/16 1614 02/17/16 2036 02/18/16 0736  GLUCAP 91 143* 204* 129* 113*   Hemoglobin A1C: No results for input(s): HGBA1C in the last 72 hours. Fasting Lipid Panel: No results for input(s): CHOL, HDL, LDLCALC, TRIG, CHOLHDL, LDLDIRECT in the last 72 hours. Thyroid Function Tests:  Recent Labs   02/17/16 0752  TSH 1.989   Anemia Panel:  Recent Labs  02/17/16 0752  VITAMINB12 233  FOLATE 17.6  FERRITIN 22  TIBC 325  IRON 15*  RETICCTPCT 2.0   Coagulation: No results for input(s): LABPROT, INR in the last 72 hours. Urine Drug Screen: Drugs of Abuse  No results found for: LABOPIA, COCAINSCRNUR, LABBENZ, AMPHETMU, THCU, LABBARB  Alcohol Level: No results for input(s): ETH in the last 72 hours. Urinalysis: No results for input(s): COLORURINE, LABSPEC, PHURINE, GLUCOSEU, HGBUR, BILIRUBINUR, KETONESUR, PROTEINUR, UROBILINOGEN, NITRITE, LEUKOCYTESUR in the last 72 hours.  Invalid input(s): APPERANCEUR Misc. Labs:   ABGS: No results for input(s): PHART, PO2ART, TCO2, HCO3 in the last 72 hours.  Invalid input(s): PCO2   MICROBIOLOGY: No results found for this or any previous visit (from the past 240 hour(s)).  Studies/Results: Ct Chest W Contrast  02/17/2016  CLINICAL DATA:  65 year old female with possible lung mass noted on recent left shoulder radiographs. EXAM: CT CHEST WITH CONTRAST TECHNIQUE: Multidetector CT imaging of the chest was performed during intravenous contrast administration. CONTRAST:  36m ISOVUE-300 IOPAMIDOL (ISOVUE-300) INJECTION 61% COMPARISON:  Chest CT 09/19/2013. Left shoulder radiograph 02/17/2016. FINDINGS: Mediastinum/Lymph Nodes: Heart size is mildly enlarged. There is no significant pericardial fluid, thickening or pericardial calcification. No pathologically enlarged mediastinal or hilar lymph nodes. Esophagus is unremarkable in appearance. No axillary lymphadenopathy. Multiple heterogeneous appearing thyroid nodules, largest of which measures 2.0 x 1.1 cm in the left lobe of the thyroid gland. Lungs/Pleura: In the left upper lobe there is a macrolobulated mass measuring 9.6 x 6.1 x 6.6 cm (axial image 32 of series 2 and coronal image 81 of series 4). This lesion makes a broad with the overlying pleura and clearly demonstrates direct chest wall  invasion in the first and second intercostal spaces. There is a tiny focus of cavitation in the superior aspect of the lesion (image 22 of series 2). Adjacent to the lesion in the medial aspect of the left upper lobe there is extensive ground-glass attenuation and septal thickening, which could reflect some lymphangitic spread of disease, postobstructive changes secondary to extrinsic mass effect upon left upper lobe bronchi, or may simply reflect perilesional hemorrhage. In the apex of the right hemithorax there is a pleural-based lesion which is centrally low-attenuation (18 HU), which is larger than prior study 09/19/2013, currently measuring 3.7 x 2.6 x 2.7 cm (previously 2.2 x 1.6 x 1.1 cm). This lesion remains smoothly marginated. No pleural effusions. Upper Abdomen: Low-attenuation lesions right kidney compatible with simple cysts, measuring up to 1.4 cm in the upper pole. Musculoskeletal/Soft Tissues: There are no aggressive appearing lytic or blastic lesions noted in the visualized  portions of the skeleton. IMPRESSION: 1. 9.6 x 6.1 x 6.6 cm macrolobulated mass in the left upper lobe with evidence of direct chest wall invasion, and surrounding opacities in the left upper lobe which are favored to reflect some early lymphangitic spread of disease (but may alternatively simply reflect postobstructive changes or perilesional hemorrhage). No mediastinal or hilar lymphadenopathy noted at this time. Further evaluation with biopsy and/or PET-CT is recommended in the near future for diagnostic and staging purposes. 2. In addition, there is a smoothly marginated pleural-based lesion in the apex of the right hemithorax which is slightly larger than prior study 09/19/2013 and is low-attenuation, suggesting a cystic lesion. This is favored to be benign (potentially a neurenteric cyst or benign peripheral nerve sheath tumor), but attention at the time of follow-up PET-CT is suggested. 3. Mild cardiomegaly. 4. Additional  incidental findings, as above. Electronically Signed   By: Vinnie Langton M.D.   On: 02/17/2016 18:54   Dg Shoulder Left  02/17/2016  CLINICAL DATA:  Chronic worsening left posterior shoulder pain, radiating to the left lower arm and left lateral chest wall. Initial encounter. EXAM: LEFT SHOULDER - 2+ VIEW COMPARISON:  None. FINDINGS: There is no evidence of fracture or dislocation. The left humeral head is seated within the glenoid fossa. The acromioclavicular joint is unremarkable in appearance. No significant soft tissue abnormalities are seen. There is a 6.6 cm masslike density at the left lung apex. This is concerning for malignancy, though pneumonia might have a similar appearance. IMPRESSION: 1. No evidence of fracture or dislocation. 2. 6.6 cm masslike density at the left lung apex. This is concerning for malignancy, though pneumonia might have a similar appearance. Contrast-enhanced CT of the chest is recommended for further evaluation. These results were called by telephone at the time of interpretation on 02/17/2016 at 12:51 am to Dr. Varney Biles, who verbally acknowledged these results. Electronically Signed   By: Garald Balding M.D.   On: 02/17/2016 00:51    Medications:  Prior to Admission:  Prescriptions prior to admission  Medication Sig Dispense Refill Last Dose  . acetaminophen (TYLENOL) 500 MG tablet Take 1,000 mg by mouth every 6 (six) hours as needed for mild pain or moderate pain.    02/16/2016 at Unknown time  . albuterol (PROVENTIL HFA;VENTOLIN HFA) 108 (90 BASE) MCG/ACT inhaler Inhale 1-2 puffs into the lungs every 6 (six) hours as needed for wheezing or shortness of breath. 1 Inhaler 0 unknown  . Cetirizine HCl 10 MG CAPS Take 1 capsule (10 mg total) by mouth at bedtime. Restart after you've completed a three-day course of Benadryl.   02/16/2016 at Unknown time  . Cholecalciferol (VITAMIN D PO) Take 1 tablet by mouth daily.   02/16/2016 at Unknown time  . diphenhydrAMINE  (BENADRYL) 25 MG tablet Take 1 tablet (25 mg total) by mouth 2 (two) times daily. Take for 3 more days. (Patient taking differently: Take 25 mg by mouth daily as needed for allergies. Take for 3 more days.)   unknown  . FERROUS SULFATE PO Take 5 g by mouth 2 (two) times daily.   02/16/2016 at Unknown time  . glimepiride (AMARYL) 1 MG tablet Take 1 tablet daily if your blood sugar is 200 and over. Further instructions per your primary care provider. 30 tablet 1 02/16/2016 at Unknown time  . oxymetazoline (NASAL SPRAY 12 HOUR) 0.05 % nasal spray Place 1 spray into both nostrils daily as needed for congestion.   unknown  . triamterene-hydrochlorothiazide (DYAZIDE)  37.5-25 MG per capsule Take 1 each (1 capsule total) by mouth daily.   02/16/2016 at Unknown time   Scheduled: . sodium chloride   Intravenous Once  . insulin aspart  0-15 Units Subcutaneous TID WC  . insulin aspart  0-5 Units Subcutaneous QHS  . pantoprazole (PROTONIX) IV  40 mg Intravenous Q12H   Continuous: . dextrose 5 % and 0.9% NaCl     IOM:BTDHRCBULAGTX (DILAUDID) injection, ondansetron (ZOFRAN) IV  Assesment: She was admitted with a lung mass. This has a typical appearance of a lung carcinoma. It looks like it's invading the chest wall. PET scan has been suggested. I think she needs needle biopsy. Principal Problem:   Anemia Active Problems:   Essential hypertension   Diabetes mellitus type 2 in obese ([facility])   Morbid obesity ([place])   Tobacco abuse   Lung mass    Plan: I will request needle biopsy.    LOS: 1 day   Jhovany Weidinger L 02/18/2016, 8:29 AM

## 2016-02-19 ENCOUNTER — Inpatient Hospital Stay (HOSPITAL_COMMUNITY): Payer: Medicaid Other

## 2016-02-19 ENCOUNTER — Ambulatory Visit (HOSPITAL_COMMUNITY)
Admission: RE | Admit: 2016-02-19 | Discharge: 2016-02-19 | Disposition: A | Discharge status: Home / Self Care | Payer: PRIVATE HEALTH INSURANCE | Source: Intra-hospital | Attending: Internal Medicine | Admitting: Internal Medicine

## 2016-02-19 ENCOUNTER — Encounter: Payer: Self-pay | Admitting: Internal Medicine

## 2016-02-19 ENCOUNTER — Ambulatory Visit (HOSPITAL_COMMUNITY)
Admit: 2016-02-19 | Discharge: 2016-02-19 | Disposition: A | Discharge status: Home / Self Care | Payer: Medicaid Other | Attending: Internal Medicine | Admitting: Internal Medicine

## 2016-02-19 DIAGNOSIS — Z9889 Other specified postprocedural states: Secondary | ICD-10-CM

## 2016-02-19 DIAGNOSIS — E119 Type 2 diabetes mellitus without complications: Secondary | ICD-10-CM

## 2016-02-19 DIAGNOSIS — D509 Iron deficiency anemia, unspecified: Secondary | ICD-10-CM

## 2016-02-19 DIAGNOSIS — N183 Chronic kidney disease, stage 3 unspecified: Secondary | ICD-10-CM | POA: Diagnosis present

## 2016-02-19 DIAGNOSIS — E669 Obesity, unspecified: Secondary | ICD-10-CM

## 2016-02-19 DIAGNOSIS — I10 Essential (primary) hypertension: Secondary | ICD-10-CM

## 2016-02-19 DIAGNOSIS — K210 Gastro-esophageal reflux disease with esophagitis: Secondary | ICD-10-CM

## 2016-02-19 DIAGNOSIS — R918 Other nonspecific abnormal finding of lung field: Secondary | ICD-10-CM

## 2016-02-19 LAB — CBC
HEMATOCRIT: 28.1 % — AB (ref 36.0–46.0)
HEMOGLOBIN: 8.5 g/dL — AB (ref 12.0–15.0)
MCH: 21.3 pg — AB (ref 26.0–34.0)
MCHC: 30.2 g/dL (ref 30.0–36.0)
MCV: 70.4 fL — AB (ref 78.0–100.0)
Platelets: 319 10*3/uL (ref 150–400)
RBC: 3.99 MIL/uL (ref 3.87–5.11)
RDW: 23.6 % — ABNORMAL HIGH (ref 11.5–15.5)
WBC: 9.2 10*3/uL (ref 4.0–10.5)

## 2016-02-19 LAB — BASIC METABOLIC PANEL
ANION GAP: 5 (ref 5–15)
BUN: 29 mg/dL — ABNORMAL HIGH (ref 6–20)
CHLORIDE: 111 mmol/L (ref 101–111)
CO2: 25 mmol/L (ref 22–32)
Calcium: 8.5 mg/dL — ABNORMAL LOW (ref 8.9–10.3)
Creatinine, Ser: 1.35 mg/dL — ABNORMAL HIGH (ref 0.44–1.00)
GFR calc non Af Amer: 41 mL/min — ABNORMAL LOW (ref >60)
GFR, EST AFRICAN AMERICAN: 47 mL/min — AB (ref >60)
Glucose, Bld: 121 mg/dL — ABNORMAL HIGH (ref 65–99)
POTASSIUM: 4.1 mmol/L (ref 3.5–5.1)
Sodium: 141 mmol/L (ref 135–145)

## 2016-02-19 LAB — GLUCOSE, CAPILLARY
GLUCOSE-CAPILLARY: 141 mg/dL — AB (ref 65–99)
Glucose-Capillary: 102 mg/dL — ABNORMAL HIGH (ref 65–99)
Glucose-Capillary: 184 mg/dL — ABNORMAL HIGH (ref 65–99)

## 2016-02-19 MED ORDER — MIDAZOLAM HCL 2 MG/2ML IJ SOLN
INTRAMUSCULAR | Status: AC | PRN
Start: 2016-02-19 — End: 2016-02-19
  Administered 2016-02-19: 0.5 mg via INTRAVENOUS
  Administered 2016-02-19: 1 mg via INTRAVENOUS

## 2016-02-19 MED ORDER — FENTANYL CITRATE (PF) 100 MCG/2ML IJ SOLN
INTRAMUSCULAR | Status: AC
  Filled 2016-02-19: qty 2

## 2016-02-19 MED ORDER — SODIUM CHLORIDE 0.9 % IV SOLN
INTRAVENOUS | Status: AC | PRN
  Administered 2016-02-19: 10 mL/h via INTRAVENOUS

## 2016-02-19 MED ORDER — FENTANYL CITRATE (PF) 100 MCG/2ML IJ SOLN
INTRAMUSCULAR | Status: AC | PRN
  Administered 2016-02-19: 25 ug via INTRAVENOUS
  Administered 2016-02-19: 50 ug via INTRAVENOUS

## 2016-02-19 MED ORDER — SODIUM CHLORIDE 0.9 % IV SOLN
510.0000 mg | Freq: Once | INTRAVENOUS | Status: AC
  Administered 2016-02-19: 510 mg via INTRAVENOUS
  Filled 2016-02-19: qty 17

## 2016-02-19 MED ORDER — LIDOCAINE HCL 1 % IJ SOLN
INTRAMUSCULAR | Status: AC
  Filled 2016-02-19: qty 20

## 2016-02-19 MED ORDER — MIDAZOLAM HCL 2 MG/2ML IJ SOLN
INTRAMUSCULAR | Status: AC
  Filled 2016-02-19: qty 2

## 2016-02-19 NOTE — Progress Notes (Signed)
Subjective:  Patient has no complaints.  Objective: Vital signs in last 24 hours: Temp:  [98.6 F (37 C)-99 F (37.2 C)] 99 F (37.2 C) (05/31 0500) Pulse Rate:  [61-106] 78 (05/31 1432) Resp:  [18-23] 18 (05/31 1432) BP: (110-151)/(52-92) 151/82 mmHg (05/31 1432) SpO2:  [95 %-100 %] 97 % (05/31 1432) Last BM Date: 02/18/16 General:   Alert,  Well-developed, well-nourished, pleasant and cooperative in NAD Head:  Normocephalic and atraumatic. Eyes:  Sclera clear, no icterus.  Abdomen:  Soft, nontender and nondistended.   Normal bowel sounds, without guarding, and without rebound.   Extremities:  Without clubbing, deformity or edema. Neurologic:  Alert and  oriented x4;  grossly normal neurologically. Skin:  Intact without significant lesions or rashes. Psych:  Alert and cooperative. Normal mood and affect.  Intake/Output from previous day: 05/30 0701 - 05/31 0700 In: 240 [P.O.:240] Out: 300 [Urine:300] Intake/Output this shift: Total I/O In: 0  Out: 200 [Urine:200]  Lab Results: CBC  Recent Labs  02/17/16 0226 02/17/16 1901 02/18/16 0522 02/19/16 0510  WBC 9.5  --  9.5 9.2  HGB 7.6* 10.5* 9.6* 8.5*  HCT 25.6* 34.1* 30.0* 28.1*  MCV 64.3*  --  69.8* 70.4*  PLT 366  --  310 319   BMET  Recent Labs  02/17/16 0226 02/18/16 0522 02/19/16 0510  NA 137 139 141  K 3.9 4.0 4.1  CL 105 110 111  CO2 '24 24 25  '$ GLUCOSE 84 101* 121*  BUN 51* 34* 29*  CREATININE 1.77* 1.43* 1.35*  CALCIUM 9.3 8.8* 8.5*   LFTs No results for input(s): BILITOT, BILIDIR, IBILI, ALKPHOS, AST, ALT, PROT, ALBUMIN in the last 72 hours. No results for input(s): LIPASE in the last 72 hours. PT/INR  Recent Labs  02/18/16 1000  LABPROT 13.9  INR 1.05      Imaging Studies: Ct Chest W Contrast  02/17/2016  CLINICAL DATA:  65 year old female with possible lung mass noted on recent left shoulder radiographs. EXAM: CT CHEST WITH CONTRAST TECHNIQUE: Multidetector CT imaging of the chest  was performed during intravenous contrast administration. CONTRAST:  68m ISOVUE-300 IOPAMIDOL (ISOVUE-300) INJECTION 61% COMPARISON:  Chest CT 09/19/2013. Left shoulder radiograph 02/17/2016. FINDINGS: Mediastinum/Lymph Nodes: Heart size is mildly enlarged. There is no significant pericardial fluid, thickening or pericardial calcification. No pathologically enlarged mediastinal or hilar lymph nodes. Esophagus is unremarkable in appearance. No axillary lymphadenopathy. Multiple heterogeneous appearing thyroid nodules, largest of which measures 2.0 x 1.1 cm in the left lobe of the thyroid gland. Lungs/Pleura: In the left upper lobe there is a macrolobulated mass measuring 9.6 x 6.1 x 6.6 cm (axial image 32 of series 2 and coronal image 81 of series 4). This lesion makes a broad with the overlying pleura and clearly demonstrates direct chest wall invasion in the first and second intercostal spaces. There is a tiny focus of cavitation in the superior aspect of the lesion (image 22 of series 2). Adjacent to the lesion in the medial aspect of the left upper lobe there is extensive ground-glass attenuation and septal thickening, which could reflect some lymphangitic spread of disease, postobstructive changes secondary to extrinsic mass effect upon left upper lobe bronchi, or may simply reflect perilesional hemorrhage. In the apex of the right hemithorax there is a pleural-based lesion which is centrally low-attenuation (18 HU), which is larger than prior study 09/19/2013, currently measuring 3.7 x 2.6 x 2.7 cm (previously 2.2 x 1.6 x 1.1 cm). This lesion remains smoothly marginated. No pleural  effusions. Upper Abdomen: Low-attenuation lesions right kidney compatible with simple cysts, measuring up to 1.4 cm in the upper pole. Musculoskeletal/Soft Tissues: There are no aggressive appearing lytic or blastic lesions noted in the visualized portions of the skeleton. IMPRESSION: 1. 9.6 x 6.1 x 6.6 cm macrolobulated mass in the  left upper lobe with evidence of direct chest wall invasion, and surrounding opacities in the left upper lobe which are favored to reflect some early lymphangitic spread of disease (but may alternatively simply reflect postobstructive changes or perilesional hemorrhage). No mediastinal or hilar lymphadenopathy noted at this time. Further evaluation with biopsy and/or PET-CT is recommended in the near future for diagnostic and staging purposes. 2. In addition, there is a smoothly marginated pleural-based lesion in the apex of the right hemithorax which is slightly larger than prior study 09/19/2013 and is low-attenuation, suggesting a cystic lesion. This is favored to be benign (potentially a neurenteric cyst or benign peripheral nerve sheath tumor), but attention at the time of follow-up PET-CT is suggested. 3. Mild cardiomegaly. 4. Additional incidental findings, as above. Electronically Signed   By: Vinnie Langton M.D.   On: 02/17/2016 18:54   Dg Shoulder Left  02/17/2016  CLINICAL DATA:  Chronic worsening left posterior shoulder pain, radiating to the left lower arm and left lateral chest wall. Initial encounter. EXAM: LEFT SHOULDER - 2+ VIEW COMPARISON:  None. FINDINGS: There is no evidence of fracture or dislocation. The left humeral head is seated within the glenoid fossa. The acromioclavicular joint is unremarkable in appearance. No significant soft tissue abnormalities are seen. There is a 6.6 cm masslike density at the left lung apex. This is concerning for malignancy, though pneumonia might have a similar appearance. IMPRESSION: 1. No evidence of fracture or dislocation. 2. 6.6 cm masslike density at the left lung apex. This is concerning for malignancy, though pneumonia might have a similar appearance. Contrast-enhanced CT of the chest is recommended for further evaluation. These results were called by telephone at the time of interpretation on 02/17/2016 at 12:51 am to Dr. Varney Biles, who  verbally acknowledged these results. Electronically Signed   By: Garald Balding M.D.   On: 02/17/2016 00:51  [2 weeks]   Assessment: Pleasant 65 year old lady admitted to the hospital with progressive weakness, left shoulder pain, left apical pulmonary mass suspicious for carcinoma, profound microcytic anemia and Hemoccult-positive stool. She reports hemoptysis. She denies any specific GI symptoms. No prior GI evaluation.  Positive family history of colon cancer in a first degree relative less than 60.   EGD showed LA grade a esophagitis, nonobstructing Schatzki ring, medium-sized hiatal hernia. Colonoscopy showed diverticulosis, five 3-8 mm polyps in the sigmoid colon, splenic flexure, ascending colon, cecum removed.  Plan: 1. Follow-up pending path. 2. PPI daily.  Laureen Ochs. [facility] [phone] 5/31/20172:51 PM     LOS: 2 days

## 2016-02-19 NOTE — Progress Notes (Signed)
PROGRESS NOTE    Susan Mahoney  IFO:[id number] DOB: 12-18-1950 DOA: 02/16/2016 PCP: [facility]   Brief Narrative: 65 y.o. female with hx of DM, Obesity, HTN, tobacco abuse, presented to the ER with left shoulder pain which she has had for several weeks, worse tonight. During the ER evaluation, she had Xray of her left shoulder, showing no Fx or dislocation, but incidentally found to have a large apical lung mass (6.6cm) suspicious for malignancy. She was noted to anemia with Hgb of 7.6. ROS now showed that she has slight hemoptysis over past couple of months, but had no evidence of GI Bleed. She had no abdominal pain, weight loss, or black stool.She admits  to having some weakness, but no SOB or CP. FOBT  Found to be positive. Hospitalist is asked to admit her for symptomatic anemia and left apical mass work up   Assessment & Plan:   Principal Problem:   Anemia Active Problems:   Essential hypertension   Diabetes mellitus type 2 in obese (HCC)   Morbid obesity ([place])   Tobacco abuse   Lung mass   Symptomatic anemia   Reflux esophagitis   Hiatal hernia   Schatzki's ring   History of colonic polyps   Diverticulosis of colon without hemorrhage  1. Anemia, likely subacute. Possible related to chronic GI losses or chronic hemoptysis. Stools heme positive on admission. Transfused 2 units PRBCs. GI following and performed EGD/Colonoscopy. Anemia panel indicates iron deficiency. Will give one dose of IV iron. Repeat CBC in AM. 2. Left atypical lung mass, concerns for malignancy. CT chest notable for 9.6 x 6.1 x 6.6 cm mass of the upper left lobe with evidence of direct chest wall invasion. There is also a smoothly marginated pleural-based lesion in the apex of the right hemithorax concerns are for metastatic disease. Interventional radiology has been consulted and biopsy performed at [facility] today. Can likely follow up with pulmonology for further  results 3. Essential HTN. Stable.  4. DM type 2. Placed on carb-modified diet. Continue SSI.  5. CKD stage 3. Creatinine appears to be at  Baseline.  DVT prophylaxis: SCDs  Code Status: Full Family Communication: discussed with husband at the bedside  Disposition Plan: discharge home, possibly in next 24 hours   Consultants:   Pulmonology  Gastroenterology  Procedures:   5/31: CT guided biopsy of left chest wall 5/30: Colonoscopy: - Diverticulosis in the entire examined colon.  - Five 3 to 8 mm polyps in the sigmoid colon, at   the splenic flexure, in the ascending colon and in   the cecum, removed with a cold snare. Resected and   retrieved.  - The examination was otherwise normal on direct    and retroflexion views. 5/30: EGD: - LA Grade A esophagitis.  - Non-obstructing Schatzki ring.  - Medium-sized hiatal hernia.  - Normal second portion of the duodenum.   - No specimens collected.  Antimicrobials:       Subjective: Feeling better. Less fatigue. No shortness of breath  Objective: Filed Vitals:   02/18/16 1231 02/18/16 1506 02/18/16 2022 02/19/16 0500  BP: 107/44 125/88 110/64 125/52  Pulse: 82 100 106 74  Temp: 98.4 F (36.9 C) 98.6 F (37 C) 98.7 F (37.1 C) 99 F (37.2 C)  TempSrc: Oral Oral Oral Oral  Resp: '20 20 20 19  '$ Height:      Weight:      SpO2: 92% 95% 100% 96%    Intake/Output  Summary (Last 24 hours) at 02/19/16 1140 Last data filed at 02/19/16 1001  Gross per 24 hour  Intake    240 ml  Output    500 ml  Net   -260 ml   Filed Weights   02/16/16 2257  Weight: 82.101 kg (181 lb)    Examination:  General exam: Appears calm and comfortable  Respiratory system: Clear to  auscultation. Respiratory effort normal. Cardiovascular system: S1 & S2 heard, RRR. No JVD, murmurs, rubs, gallops or clicks. No pedal edema. Gastrointestinal system: Abdomen is nondistended, soft and nontender. No organomegaly or masses felt. Normal bowel sounds heard. Central nervous system: Alert and oriented. No focal neurological deficits. Extremities: Symmetric 5 x 5 power. Skin: No rashes, lesions or ulcers Psychiatry: Judgement and insight appear normal. Mood & affect appropriate.     Data Reviewed: I have personally reviewed following labs and imaging studies  CBC:  Recent Labs Lab 02/17/16 0226 02/17/16 1901 02/18/16 0522 02/19/16 0510  WBC 9.5  --  9.5 9.2  NEUTROABS 5.3  --   --   --   HGB 7.6* 10.5* 9.6* 8.5*  HCT 25.6* 34.1* 30.0* 28.1*  MCV 64.3*  --  69.8* 70.4*  PLT 366  --  310 431   Basic Metabolic Panel:  Recent Labs Lab 02/17/16 0226 02/18/16 0522 02/19/16 0510  NA 137 139 141  K 3.9 4.0 4.1  CL 105 110 111  CO2 '24 24 25  '$ GLUCOSE 84 101* 121*  BUN 51* 34* 29*  CREATININE 1.77* 1.43* 1.35*  CALCIUM 9.3 8.8* 8.5*   GFR: Estimated Creatinine Clearance: 41.4 mL/min (by C-G formula based on Cr of 1.35). Liver Function Tests: No results for input(s): AST, ALT, ALKPHOS, BILITOT, PROT, ALBUMIN in the last 168 hours. No results for input(s): LIPASE, AMYLASE in the last 168 hours. No results for input(s): AMMONIA in the last 168 hours. Coagulation Profile:  Recent Labs Lab 02/18/16 1000  INR 1.05   Cardiac Enzymes: No results for input(s): CKTOTAL, CKMB, CKMBINDEX, TROPONINI in the last 168 hours. BNP (last 3 results) No results for input(s): PROBNP in the last 8760 hours. HbA1C: No results for input(s): HGBA1C in the last 72 hours. CBG:  Recent Labs Lab 02/18/16 0736 02/18/16 1109 02/18/16 1706 02/18/16 2048 02/19/16 0738  GLUCAP 113* 138* 214* 117* 141*   Lipid Profile: No results for input(s): CHOL, HDL, LDLCALC, TRIG, CHOLHDL,  LDLDIRECT in the last 72 hours. Thyroid Function Tests:  Recent Labs  02/17/16 0752  TSH 1.989   Anemia Panel:  Recent Labs  02/17/16 0752  VITAMINB12 233  FOLATE 17.6  FERRITIN 22  TIBC 325  IRON 15*  RETICCTPCT 2.0   Urine analysis: No results found for: COLORURINE, APPEARANCEUR, LABSPEC, PHURINE, GLUCOSEU, HGBUR, BILIRUBINUR, KETONESUR, PROTEINUR, UROBILINOGEN, NITRITE, LEUKOCYTESUR Sepsis Labs: '@LABRCNTIP'$ (procalcitonin:4,lacticidven:4)  )No results found for this or any previous visit (from the past 240 hour(s)).       Radiology Studies: Ct Chest W Contrast  02/17/2016  CLINICAL DATA:  65 year old female with possible lung mass noted on recent left shoulder radiographs. EXAM: CT CHEST WITH CONTRAST TECHNIQUE: Multidetector CT imaging of the chest was performed during intravenous contrast administration. CONTRAST:  102m ISOVUE-300 IOPAMIDOL (ISOVUE-300) INJECTION 61% COMPARISON:  Chest CT 09/19/2013. Left shoulder radiograph 02/17/2016. FINDINGS: Mediastinum/Lymph Nodes: Heart size is mildly enlarged. There is no significant pericardial fluid, thickening or pericardial calcification. No pathologically enlarged mediastinal or hilar lymph nodes. Esophagus is unremarkable in appearance. No axillary lymphadenopathy. Multiple heterogeneous  appearing thyroid nodules, largest of which measures 2.0 x 1.1 cm in the left lobe of the thyroid gland. Lungs/Pleura: In the left upper lobe there is a macrolobulated mass measuring 9.6 x 6.1 x 6.6 cm (axial image 32 of series 2 and coronal image 81 of series 4). This lesion makes a broad with the overlying pleura and clearly demonstrates direct chest wall invasion in the first and second intercostal spaces. There is a tiny focus of cavitation in the superior aspect of the lesion (image 22 of series 2). Adjacent to the lesion in the medial aspect of the left upper lobe there is extensive ground-glass attenuation and septal thickening, which could  reflect some lymphangitic spread of disease, postobstructive changes secondary to extrinsic mass effect upon left upper lobe bronchi, or may simply reflect perilesional hemorrhage. In the apex of the right hemithorax there is a pleural-based lesion which is centrally low-attenuation (18 HU), which is larger than prior study 09/19/2013, currently measuring 3.7 x 2.6 x 2.7 cm (previously 2.2 x 1.6 x 1.1 cm). This lesion remains smoothly marginated. No pleural effusions. Upper Abdomen: Low-attenuation lesions right kidney compatible with simple cysts, measuring up to 1.4 cm in the upper pole. Musculoskeletal/Soft Tissues: There are no aggressive appearing lytic or blastic lesions noted in the visualized portions of the skeleton. IMPRESSION: 1. 9.6 x 6.1 x 6.6 cm macrolobulated mass in the left upper lobe with evidence of direct chest wall invasion, and surrounding opacities in the left upper lobe which are favored to reflect some early lymphangitic spread of disease (but may alternatively simply reflect postobstructive changes or perilesional hemorrhage). No mediastinal or hilar lymphadenopathy noted at this time. Further evaluation with biopsy and/or PET-CT is recommended in the near future for diagnostic and staging purposes. 2. In addition, there is a smoothly marginated pleural-based lesion in the apex of the right hemithorax which is slightly larger than prior study 09/19/2013 and is low-attenuation, suggesting a cystic lesion. This is favored to be benign (potentially a neurenteric cyst or benign peripheral nerve sheath tumor), but attention at the time of follow-up PET-CT is suggested. 3. Mild cardiomegaly. 4. Additional incidental findings, as above. Electronically Signed   By: Vinnie Langton M.D.   On: 02/17/2016 18:54        Scheduled Meds: . sodium chloride   Intravenous Once  . insulin aspart  0-15 Units Subcutaneous TID WC  . insulin aspart  0-5 Units Subcutaneous QHS  . pantoprazole  (PROTONIX) IV  40 mg Intravenous Q12H   Continuous Infusions: . dextrose 5 % and 0.9% NaCl Stopped (02/19/16 0930)     LOS: 2 days    Time spent: 42mns    MKathie Dike MD [facility] Pager [phone] If 7PM-7AM, please contact night-coverage [email or web address] Password [email or web address] 02/19/2016, 11:40 AM

## 2016-02-19 NOTE — Sedation Documentation (Signed)
Patient is resting comfortably. 

## 2016-02-19 NOTE — Progress Notes (Signed)
She says she feels okay. She has no new complaints. Her cough is better. She's not coughing up any blood. She is scheduled for biopsy of the lung lesion/chest wall today in [place]. I discussed the procedure with her and she understands. Further treatment of course depending on pathology

## 2016-02-19 NOTE — Sedation Documentation (Signed)
CXR ok per Dr Earleen Newport.  [facility]  called

## 2016-02-19 NOTE — Consult Note (Signed)
Chief Complaint: Patient was seen in consultation today for left chest wall/ rib lesion biopsy Chief Complaint  Patient presents with  . Arm Pain   at the request of Dr Sinda Du  Referring Physician(s): Dr Sinda Du  Supervising Physician: Corrie Mckusick  Patient Status: In-pt   History of Present Illness: Susan Mahoney is a 65 y.o. female   Hx COPD Cough for weeks Developed left chest and shoulder pain - worsening Presented to [facility] ED 5/29  Work up revealed: CT Chest: IMPRESSION: 1. 9.6 x 6.1 x 6.6 cm macrolobulated mass in the left upper lobe with evidence of direct chest wall invasion, and surrounding opacities in the left upper lobe which are favored to reflect some early lymphangitic spread of disease (but may alternatively simply reflect postobstructive changes or perilesional hemorrhage). No mediastinal or hilar lymphadenopathy noted at this time. Further evaluation with biopsy and/or PET-CT is recommended in the near future for diagnostic and staging purposes. 2. In addition, there is a smoothly marginated pleural-based lesion in the apex of the right hemithorax which is slightly larger than prior study 09/19/2013 and is low-attenuation, suggesting a cystic lesion. This is favored to be benign (potentially a neurenteric cyst or benign peripheral nerve sheath tumor), but attention at the time of follow-up PET-CT is suggested. 3. Mild cardiomegaly. 4. Additional incidental findings, as above.  Request for biopsy per Dr Luan Pulling Reviewed with Dr Vernard Gambles and approves procedure Transferred to [facility] for biopsy today Will return to [facility] after procedure  Past Medical History  Diagnosis Date  . Hypertension   . Sinus infection   . Diabetes mellitus, type 2 ([place])   . Obesity   . Anemia   . Tobacco abuse     Past Surgical History  Procedure Laterality Date  . Abdominal hysterectomy      Allergies: Ace inhibitors and  Indomethacin  Medications: Prior to Admission medications   Medication Sig Start Date End Date Taking? Authorizing Provider  acetaminophen (TYLENOL) 500 MG tablet Take 1,000 mg by mouth every 6 (six) hours as needed for mild pain or moderate pain.    Yes Historical Provider, MD  albuterol (PROVENTIL HFA;VENTOLIN HFA) 108 (90 BASE) MCG/ACT inhaler Inhale 1-2 puffs into the lungs every 6 (six) hours as needed for wheezing or shortness of breath. 09/07/13  Yes Leonard Schwartz, MD  Cetirizine HCl 10 MG CAPS Take 1 capsule (10 mg total) by mouth at bedtime. Restart after you've completed a three-day course of Benadryl. 09/03/13  Yes Rexene Alberts, MD  Cholecalciferol (VITAMIN D PO) Take 1 tablet by mouth daily.   Yes Historical Provider, MD  diphenhydrAMINE (BENADRYL) 25 MG tablet Take 1 tablet (25 mg total) by mouth 2 (two) times daily. Take for 3 more days. Patient taking differently: Take 25 mg by mouth daily as needed for allergies. Take for 3 more days. 09/03/13  Yes Rexene Alberts, MD  FERROUS SULFATE PO Take 5 g by mouth 2 (two) times daily.   Yes Historical Provider, MD  glimepiride (AMARYL) 1 MG tablet Take 1 tablet daily if your blood sugar is 200 and over. Further instructions per your primary care provider. 09/03/13  Yes Rexene Alberts, MD  oxymetazoline (NASAL SPRAY 12 HOUR) 0.05 % nasal spray Place 1 spray into both nostrils daily as needed for congestion.   Yes Historical Provider, MD  triamterene-hydrochlorothiazide (DYAZIDE) 37.5-25 MG per capsule Take 1 each (1 capsule total) by mouth daily. 09/03/13  Yes Rexene Alberts, MD  Family History  Problem Relation Age of Onset  . Cancer Father     Social History   Social History  . Marital Status: Married    Spouse Name: N/A  . Number of Children: N/A  . Years of Education: N/A   Social History Main Topics  . Smoking status: Current Every Day Smoker -- 0.50 packs/day for 30 years    Types: Cigarettes  . Smokeless tobacco: Never  Used  . Alcohol Use: No  . Drug Use: No  . Sexual Activity: Yes    Birth Control/ Protection: Surgical   Other Topics Concern  . None   Social History Narrative    Review of Systems: A 12 point ROS discussed and pertinent positives are indicated in the HPI above.  All other systems are negative.  Review of Systems  Constitutional: Positive for fatigue. Negative for fever and activity change.  Respiratory: Positive for cough and shortness of breath.   Cardiovascular: Positive for chest pain.  Gastrointestinal: Negative for abdominal pain.  Musculoskeletal: Positive for back pain.  Neurological: Positive for weakness.  Psychiatric/Behavioral: Negative for behavioral problems and confusion.    Vital Signs: BP 125/52 mmHg  Pulse 74  Temp(Src) 99 F (37.2 C) (Oral)  Resp 19  Ht '5\' 1"'$  (1.549 m)  Wt 181 lb (82.101 kg)  BMI 34.22 kg/m2  SpO2 96%  Physical Exam  Constitutional: She is oriented to person, place, and time. She appears well-nourished.  Cardiovascular: Normal rate, regular rhythm and normal heart sounds.   Pulmonary/Chest: Effort normal and breath sounds normal. She has no wheezes.  Abdominal: Soft. There is no tenderness.  Musculoskeletal: Normal range of motion. She exhibits tenderness.  Neurological: She is alert and oriented to person, place, and time.  Skin: Skin is warm and dry.  Psychiatric: She has a normal mood and affect. Her behavior is normal. Judgment and thought content normal.  Nursing note and vitals reviewed.   Mallampati Score:  MD Evaluation Airway: WNL Heart: WNL Abdomen: WNL Chest/ Lungs: WNL ASA  Classification: 3 Mallampati/Airway Score: One  Imaging: Ct Chest W Contrast  02/17/2016  CLINICAL DATA:  65 year old female with possible lung mass noted on recent left shoulder radiographs. EXAM: CT CHEST WITH CONTRAST TECHNIQUE: Multidetector CT imaging of the chest was performed during intravenous contrast administration. CONTRAST:   59m ISOVUE-300 IOPAMIDOL (ISOVUE-300) INJECTION 61% COMPARISON:  Chest CT 09/19/2013. Left shoulder radiograph 02/17/2016. FINDINGS: Mediastinum/Lymph Nodes: Heart size is mildly enlarged. There is no significant pericardial fluid, thickening or pericardial calcification. No pathologically enlarged mediastinal or hilar lymph nodes. Esophagus is unremarkable in appearance. No axillary lymphadenopathy. Multiple heterogeneous appearing thyroid nodules, largest of which measures 2.0 x 1.1 cm in the left lobe of the thyroid gland. Lungs/Pleura: In the left upper lobe there is a macrolobulated mass measuring 9.6 x 6.1 x 6.6 cm (axial image 32 of series 2 and coronal image 81 of series 4). This lesion makes a broad with the overlying pleura and clearly demonstrates direct chest wall invasion in the first and second intercostal spaces. There is a tiny focus of cavitation in the superior aspect of the lesion (image 22 of series 2). Adjacent to the lesion in the medial aspect of the left upper lobe there is extensive ground-glass attenuation and septal thickening, which could reflect some lymphangitic spread of disease, postobstructive changes secondary to extrinsic mass effect upon left upper lobe bronchi, or may simply reflect perilesional hemorrhage. In the apex of the right hemithorax there is a  pleural-based lesion which is centrally low-attenuation (18 HU), which is larger than prior study 09/19/2013, currently measuring 3.7 x 2.6 x 2.7 cm (previously 2.2 x 1.6 x 1.1 cm). This lesion remains smoothly marginated. No pleural effusions. Upper Abdomen: Low-attenuation lesions right kidney compatible with simple cysts, measuring up to 1.4 cm in the upper pole. Musculoskeletal/Soft Tissues: There are no aggressive appearing lytic or blastic lesions noted in the visualized portions of the skeleton. IMPRESSION: 1. 9.6 x 6.1 x 6.6 cm macrolobulated mass in the left upper lobe with evidence of direct chest wall invasion, and  surrounding opacities in the left upper lobe which are favored to reflect some early lymphangitic spread of disease (but may alternatively simply reflect postobstructive changes or perilesional hemorrhage). No mediastinal or hilar lymphadenopathy noted at this time. Further evaluation with biopsy and/or PET-CT is recommended in the near future for diagnostic and staging purposes. 2. In addition, there is a smoothly marginated pleural-based lesion in the apex of the right hemithorax which is slightly larger than prior study 09/19/2013 and is low-attenuation, suggesting a cystic lesion. This is favored to be benign (potentially a neurenteric cyst or benign peripheral nerve sheath tumor), but attention at the time of follow-up PET-CT is suggested. 3. Mild cardiomegaly. 4. Additional incidental findings, as above. Electronically Signed   By: Vinnie Langton M.D.   On: 02/17/2016 18:54   Dg Shoulder Left  02/17/2016  CLINICAL DATA:  Chronic worsening left posterior shoulder pain, radiating to the left lower arm and left lateral chest wall. Initial encounter. EXAM: LEFT SHOULDER - 2+ VIEW COMPARISON:  None. FINDINGS: There is no evidence of fracture or dislocation. The left humeral head is seated within the glenoid fossa. The acromioclavicular joint is unremarkable in appearance. No significant soft tissue abnormalities are seen. There is a 6.6 cm masslike density at the left lung apex. This is concerning for malignancy, though pneumonia might have a similar appearance. IMPRESSION: 1. No evidence of fracture or dislocation. 2. 6.6 cm masslike density at the left lung apex. This is concerning for malignancy, though pneumonia might have a similar appearance. Contrast-enhanced CT of the chest is recommended for further evaluation. These results were called by telephone at the time of interpretation on 02/17/2016 at 12:51 am to Dr. Varney Biles, who verbally acknowledged these results. Electronically Signed   By: Garald Balding M.D.   On: 02/17/2016 00:51    Labs:  CBC:  Recent Labs  02/17/16 0226 02/17/16 1901 02/18/16 0522 02/19/16 0510  WBC 9.5  --  9.5 9.2  HGB 7.6* 10.5* 9.6* 8.5*  HCT 25.6* 34.1* 30.0* 28.1*  PLT 366  --  310 319    COAGS:  Recent Labs  02/18/16 1000  INR 1.05  APTT 28    BMP:  Recent Labs  02/17/16 0226 02/18/16 0522 02/19/16 0510  NA 137 139 141  K 3.9 4.0 4.1  CL 105 110 111  CO2 '24 24 25  '$ GLUCOSE 84 101* 121*  BUN 51* 34* 29*  CALCIUM 9.3 8.8* 8.5*  CREATININE 1.77* 1.43* 1.35*  GFRNONAA 29* 38* 41*  GFRAA 34* 44* 47*    LIVER FUNCTION TESTS: No results for input(s): BILITOT, AST, ALT, ALKPHOS, PROT, ALBUMIN in the last 8760 hours.  TUMOR MARKERS: No results for input(s): AFPTM, CEA, CA199, CHROMGRNA in the last 8760 hours.  Assessment and Plan:  Left chest pain and cough CT revealing left chest wall and rib mass Now scheduled for biopsy of same. Risks and  Benefits discussed with the patient including, but not limited to bleeding, hemoptysis, respiratory failure requiring intubation, infection, pneumothorax requiring chest tube placement, stroke from air embolism or even death. All of the patient's questions were answered, patient is agreeable to proceed. Consent signed and in chart.   Thank you for this interesting consult.  I greatly enjoyed meeting MAZELLA DEEN and look forward to participating in their care.  A copy of this report was sent to the requesting provider on this date.  Electronically Signed: Janetta Vandoren A 02/19/2016, 11:38 AM   I spent a total of 40 Minutes    in face to face in clinical consultation, greater than 50% of which was counseling/coordinating care for left chest wall mass bx

## 2016-02-19 NOTE — Sedation Documentation (Signed)
This is on wrong encounter. Will do sedation under CT biopsy

## 2016-02-19 NOTE — Sedation Documentation (Signed)
Taken for chest x ray

## 2016-02-19 NOTE — Sedation Documentation (Signed)
Report received from Rudene Re, RN.  Brought to IR holding area.  Denies chest pain or SOB, VSS awaiting CXray before tx back from [facility]

## 2016-02-19 NOTE — Procedures (Signed)
Interventional Radiology Procedure Note  Procedure: CT guided biopsy of left chest wall mass Complications: None Recommendations: - Bedrest until CXR cleared.  Minimize talking, coughing or otherwise straining.  - Follow up 1 hr CXR pending   Signed,  Signed,  Jartavious Mckimmy S. Earleen Newport, DO

## 2016-02-20 ENCOUNTER — Ambulatory Visit (HOSPITAL_COMMUNITY): Payer: Medicaid Other

## 2016-02-20 ENCOUNTER — Other Ambulatory Visit (HOSPITAL_COMMUNITY): Payer: Self-pay

## 2016-02-20 DIAGNOSIS — D649 Anemia, unspecified: Secondary | ICD-10-CM

## 2016-02-20 LAB — CBC
HEMATOCRIT: 28 % — AB (ref 36.0–46.0)
HEMOGLOBIN: 8.3 g/dL — AB (ref 12.0–15.0)
MCH: 21 pg — ABNORMAL LOW (ref 26.0–34.0)
MCHC: 29.6 g/dL — ABNORMAL LOW (ref 30.0–36.0)
MCV: 70.9 fL — ABNORMAL LOW (ref 78.0–100.0)
Platelets: 305 10*3/uL (ref 150–400)
RBC: 3.95 MIL/uL (ref 3.87–5.11)
RDW: 24.2 % — ABNORMAL HIGH (ref 11.5–15.5)
WBC: 9.4 10*3/uL (ref 4.0–10.5)

## 2016-02-20 LAB — GLUCOSE, CAPILLARY
GLUCOSE-CAPILLARY: 139 mg/dL — AB (ref 65–99)
Glucose-Capillary: 128 mg/dL — ABNORMAL HIGH (ref 65–99)

## 2016-02-20 MED ORDER — HYDROCODONE-ACETAMINOPHEN 5-325 MG PO TABS: 1.0000 | ORAL_TABLET | Freq: Four times a day (QID) | ORAL | Status: DC | PRN

## 2016-02-20 MED ORDER — PANTOPRAZOLE SODIUM 40 MG PO TBEC: 40.0000 mg | DELAYED_RELEASE_TABLET | Freq: Two times a day (BID) | ORAL | Status: DC

## 2016-02-20 MED ORDER — FERROUS GLUCONATE 324 (38 FE) MG PO TABS: 324.0000 mg | ORAL_TABLET | Freq: Two times a day (BID) | ORAL | Status: AC

## 2016-02-20 NOTE — Discharge Summary (Signed)
Physician Discharge Summary  Susan Mahoney NLZ:[id number] DOB: May 01, 1951 DOA: 02/16/2016  PCP: [facility]  Admit date: 02/16/2016 Discharge date: 02/20/2016  Time spent: 35 minutes  Recommendations for Outpatient Follow-up:  1. Follow up with PCP in 1-2 weeks.  2. Follow up with outpatient oncology on 6/9 to discuss biopsy results.  3. Follow up with outpatient GI.   Discharge Diagnoses:  Principal Problem:   Anemia Active Problems:   Essential hypertension   Diabetes mellitus type 2 in obese (HCC)   Morbid obesity (HCC)   Tobacco abuse   Lung mass   Symptomatic anemia   Reflux esophagitis   Hiatal hernia   Schatzki's ring   History of colonic polyps   Diverticulosis of colon without hemorrhage   CKD (chronic kidney disease) stage 3, GFR 30-59 ml/min   Discharge Condition: Improved   Diet recommendation: hearty healthy   Filed Weights   02/16/16 2257  Weight: 82.101 kg (181 lb)    History of present illness:  65 y.o. female with hx of DM, Obesity, HTN, tobacco abuse, presented to the ER with left shoulder pain which she has had for several weeks, worse tonight. During the ER evaluation, she had Xray of her left shoulder, showing no Fx or dislocation, but incidentally found to have a large apical lung mass (6.6cm) suspicious for malignancy. She was noted to anemia with Hgb of 7.6. ROS now showed that she has slight hemoptysis over past couple of months, but had no evidence of GI Bleed. She had no abdominal pain, weight loss, or black stool.She admits to having some weakness, but no SOB or CP. FOBT Found to be positive. Hospitalist is asked to admit her for symptomatic anemia and left apical mass work up  Hospital Course:  Patient was admitted for symptomatic anemia, felt to be subacute. Anemia posssibly related to chronic GI loss and chronic hemoptysis. She was noted to have hemepositive stool on admission. Anemia panel indicated iron  deficiency. She was transfused 2 units PRBCs and given one dose of IV iron. GI consulted. EGD 5/31 which revealed LA grade a esophagitis, nonobstructing Schatzki ring, medium-sized hiatal hernia. Colonoscopy 5/31 which revealed diverticulosis, five 3-8 mm polyps in the sigmoid colon, splenic flexure, ascending colon, cecum removed. Hgb has remained stable and she has not required further transfusion.Clinically she has improved. Will continue PPI BID. She will follow up with GI for biopsy results.   During evaluation for left shoulder pain, Xray of shoulder negative for fracture and dislocation. Yet, an incidental finding of a large apical lung mass (6.6cm) suspicious for malignancy was found. CT chest notable for 9.6 x 6.1 x 6.6 cm mass of the upper left lobe with evidence of direct chest wall invasion. There is also a smoothly marginated pleural-based lesion in the apex of the right hemithorax concerns are for metastatic disease. Interventional radiology has been consulted and biopsy performed at [facility] 5/31. Can likely follow up with pulmonology for further results. She has been set up with oncology to discuss biopsy results.  1. Essential HTN. Stable.  2. DM type 2. Placed on carb-modified diet. Continue SSI. 3. CKD stage 3. Creatinine at baseline.   Procedures:  5/31: CT guided biopsy of left chest wall 5/30: Colonoscopy: - Diverticulosis in the entire examined colon.  - Five 3 to 8 mm polyps in the sigmoid colon, at   the splenic flexure, in the ascending colon and in   the cecum, removed with a cold  snare. Resected and   retrieved.  - The examination was otherwise normal on direct    and retroflexion views. 5/30: EGD: - LA Grade A esophagitis.  - Non-obstructing Schatzki ring.  - Medium-sized  hiatal hernia.  - Normal second portion of the duodenum.  - No specimens collected. Consultations:  Pulmonology   Gastroenterology   Discharge Exam: Filed Vitals:   02/19/16 2124 02/20/16 0557  BP: 132/68 130/54  Pulse: 73 65  Temp: 98 F (36.7 C) 97.6 F (36.4 C)  Resp: 20 18    General: NAD, looks comfortable Cardiovascular: RRR, S1, S2  Respiratory: clear bilaterally, No wheezing, rales or rhonchi Abdomen: soft, non tender, no distention , bowel sounds normal Musculoskeletal: No edema b/l   Discharge Instructions   Discharge Instructions    Diet - low sodium heart healthy    Complete by:  As directed      Increase activity slowly    Complete by:  As directed           Current Discharge Medication List    START taking these medications   Details  ferrous gluconate (FERGON) 324 MG tablet Take 1 tablet (324 mg total) by mouth 2 (two) times daily with a meal. Qty: 60 tablet, Refills: 3    HYDROcodone-acetaminophen (NORCO) 5-325 MG tablet Take 1-2 tablets by mouth every 6 (six) hours as needed for moderate pain. Qty: 30 tablet, Refills: 0    pantoprazole (PROTONIX) 40 MG tablet Take 1 tablet (40 mg total) by mouth 2 (two) times daily before a meal. Qty: 60 tablet, Refills: 1      CONTINUE these medications which have NOT CHANGED   Details  acetaminophen (TYLENOL) 500 MG tablet Take 1,000 mg by mouth every 6 (six) hours as needed for mild pain or moderate pain.     albuterol (PROVENTIL HFA;VENTOLIN HFA) 108 (90 BASE) MCG/ACT inhaler Inhale 1-2 puffs into the lungs every 6 (six) hours as needed for wheezing or shortness of breath. Qty: 1 Inhaler, Refills: 0    Cetirizine HCl 10 MG CAPS Take 1 capsule (10 mg total) by mouth at bedtime. Restart after you've completed a three-day course of Benadryl.    Cholecalciferol (VITAMIN D PO) Take 1 tablet by mouth daily.    diphenhydrAMINE (BENADRYL) 25 MG tablet Take 1 tablet  (25 mg total) by mouth 2 (two) times daily. Take for 3 more days.    glimepiride (AMARYL) 1 MG tablet Take 1 tablet daily if your blood sugar is 200 and over. Further instructions per your primary care provider. Qty: 30 tablet, Refills: 1    oxymetazoline (NASAL SPRAY 12 HOUR) 0.05 % nasal spray Place 1 spray into both nostrils daily as needed for congestion.    triamterene-hydrochlorothiazide (DYAZIDE) 37.5-25 MG per capsule Take 1 each (1 capsule total) by mouth daily.      STOP taking these medications     FERROUS SULFATE PO        Allergies  Allergen Reactions  . Ace Inhibitors Swelling  . Indomethacin     Swelling    Follow-up Information    Follow up with Molli Hazard, MD On 02/28/2016.   Specialties:  Hematology and Oncology, Oncology   Why:  @ 2:00 pm at [facility] 4th floor   Contact information:   [place] [address] [phone]        The results of significant diagnostics from this hospitalization (including imaging, microbiology, ancillary and laboratory) are listed below  for reference.    Significant Diagnostic Studies: Dg Chest 1 View  02/19/2016  CLINICAL DATA:  Status post left lung biopsy. EXAM: CHEST 1 VIEW COMPARISON:  None. FINDINGS: The heart size and mediastinal contours are within normal limits. No pneumothorax is noted. Right peritracheal mass is noted consistent with adenopathy. Large left upper lobe mass is again noted. No pleural effusion is noted. The visualized skeletal structures are unremarkable. IMPRESSION: No pneumothorax status post biopsy of left upper lobe lung mass. Electronically Signed   By: Marijo Conception, M.D.   On: 02/19/2016 15:31   Ct Chest W Contrast  02/17/2016  CLINICAL DATA:  65 year old female with possible lung mass noted on recent left shoulder radiographs. EXAM: CT CHEST WITH CONTRAST TECHNIQUE: Multidetector CT imaging of the chest was performed during intravenous contrast administration.  CONTRAST:  32m ISOVUE-300 IOPAMIDOL (ISOVUE-300) INJECTION 61% COMPARISON:  Chest CT 09/19/2013. Left shoulder radiograph 02/17/2016. FINDINGS: Mediastinum/Lymph Nodes: Heart size is mildly enlarged. There is no significant pericardial fluid, thickening or pericardial calcification. No pathologically enlarged mediastinal or hilar lymph nodes. Esophagus is unremarkable in appearance. No axillary lymphadenopathy. Multiple heterogeneous appearing thyroid nodules, largest of which measures 2.0 x 1.1 cm in the left lobe of the thyroid gland. Lungs/Pleura: In the left upper lobe there is a macrolobulated mass measuring 9.6 x 6.1 x 6.6 cm (axial image 32 of series 2 and coronal image 81 of series 4). This lesion makes a broad with the overlying pleura and clearly demonstrates direct chest wall invasion in the first and second intercostal spaces. There is a tiny focus of cavitation in the superior aspect of the lesion (image 22 of series 2). Adjacent to the lesion in the medial aspect of the left upper lobe there is extensive ground-glass attenuation and septal thickening, which could reflect some lymphangitic spread of disease, postobstructive changes secondary to extrinsic mass effect upon left upper lobe bronchi, or may simply reflect perilesional hemorrhage. In the apex of the right hemithorax there is a pleural-based lesion which is centrally low-attenuation (18 HU), which is larger than prior study 09/19/2013, currently measuring 3.7 x 2.6 x 2.7 cm (previously 2.2 x 1.6 x 1.1 cm). This lesion remains smoothly marginated. No pleural effusions. Upper Abdomen: Low-attenuation lesions right kidney compatible with simple cysts, measuring up to 1.4 cm in the upper pole. Musculoskeletal/Soft Tissues: There are no aggressive appearing lytic or blastic lesions noted in the visualized portions of the skeleton. IMPRESSION: 1. 9.6 x 6.1 x 6.6 cm macrolobulated mass in the left upper lobe with evidence of direct chest wall  invasion, and surrounding opacities in the left upper lobe which are favored to reflect some early lymphangitic spread of disease (but may alternatively simply reflect postobstructive changes or perilesional hemorrhage). No mediastinal or hilar lymphadenopathy noted at this time. Further evaluation with biopsy and/or PET-CT is recommended in the near future for diagnostic and staging purposes. 2. In addition, there is a smoothly marginated pleural-based lesion in the apex of the right hemithorax which is slightly larger than prior study 09/19/2013 and is low-attenuation, suggesting a cystic lesion. This is favored to be benign (potentially a neurenteric cyst or benign peripheral nerve sheath tumor), but attention at the time of follow-up PET-CT is suggested. 3. Mild cardiomegaly. 4. Additional incidental findings, as above. Electronically Signed   By: DVinnie LangtonM.D.   On: 02/17/2016 18:54   Ct Biopsy  02/19/2016  INDICATION: 65year old female with a history of a left chest wall mass EXAM: CT  BIOPSY MEDICATIONS: None. ANESTHESIA/SEDATION: Moderate (conscious) sedation was employed during this procedure. A total of Versed 1.5 mg and Fentanyl 75 mcg was administered intravenously. Moderate Sedation Time: 11 minutes. The patient's level of consciousness and vital signs were monitored continuously by radiology nursing throughout the procedure under my direct supervision. FLUOROSCOPY TIME:  CT COMPLICATIONS: None PROCEDURE: The procedure, risks, benefits, and alternatives were explained to the patient and the patient's family. Specific risks that were addressed included bleeding, infection, pneumothorax, need for further procedure including chest tube placement, chance of delayed pneumothorax or hemorrhage, hemoptysis, nondiagnostic sample, cardiopulmonary collapse, death. Questions regarding the procedure were encouraged and answered. The patient understands and consents to the procedure. Patient was  positioned in the supine position on the CT gantry table and a scout CT of the chest was performed for planning purposes. Once angle of approach was determined, the skin and subcutaneous tissues this scan was prepped and draped in the usual sterile fashion, and a sterile drape was applied covering the operative field. A sterile gown and sterile gloves were used for the procedure. Local anesthesia was provided with 1% Lidocaine. The skin and subcutaneous tissues were infiltrated 1% lidocaine for local anesthesia, and a small stab incision was made with an 11 blade scalpel. Using CT guidance, a 17 gauge trocar needle was advanced into the left chest wall mass. After confirmation of the tip, separate 18 gauge core biopsies were performed. These were placed into solution for transportation to the lab. Patient tolerated the procedure well and remained hemodynamically stable throughout. No complications were encountered and no significant blood loss was encountered. IMPRESSION: Status post CT-guided biopsy of left chest wall mass. Tissue specimen sent to pathology for complete histopathologic analysis. Signed, Dulcy Fanny. Earleen Newport, DO [facility] Electronically Signed   By: Corrie Mckusick D.O.   On: 02/19/2016 17:48   Dg Shoulder Left  02/17/2016  CLINICAL DATA:  Chronic worsening left posterior shoulder pain, radiating to the left lower arm and left lateral chest wall. Initial encounter. EXAM: LEFT SHOULDER - 2+ VIEW COMPARISON:  None. FINDINGS: There is no evidence of fracture or dislocation. The left humeral head is seated within the glenoid fossa. The acromioclavicular joint is unremarkable in appearance. No significant soft tissue abnormalities are seen. There is a 6.6 cm masslike density at the left lung apex. This is concerning for malignancy, though pneumonia might have a similar appearance. IMPRESSION: 1. No evidence of fracture or dislocation. 2. 6.6 cm  masslike density at the left lung apex. This is concerning for malignancy, though pneumonia might have a similar appearance. Contrast-enhanced CT of the chest is recommended for further evaluation. These results were called by telephone at the time of interpretation on 02/17/2016 at 12:51 am to Dr. Varney Biles, who verbally acknowledged these results. Electronically Signed   By: Garald Balding M.D.   On: 02/17/2016 00:51    Microbiology: No results found for this or any previous visit (from the past 240 hour(s)).   Labs: Basic Metabolic Panel:  Recent Labs Lab 02/17/16 0226 02/18/16 0522 02/19/16 0510  NA 137 139 141  K 3.9 4.0 4.1  CL 105 110 111  CO2 '24 24 25  '$ GLUCOSE 84 101* 121*  BUN 51* 34* 29*  CREATININE 1.77* 1.43* 1.35*  CALCIUM 9.3 8.8* 8.5*  CBC:  Recent Labs Lab 02/17/16 0226 02/17/16 1901 02/18/16 0522 02/19/16 0510 02/20/16 0536  WBC 9.5  --  9.5 9.2 9.4  NEUTROABS 5.3  --   --   --   --  HGB 7.6* 10.5* 9.6* 8.5* 8.3*  HCT 25.6* 34.1* 30.0* 28.1* 28.0*  MCV 64.3*  --  69.8* 70.4* 70.9*  PLT 366  --  310 319 305   CBG:  Recent Labs Lab 02/19/16 0738 02/19/16 1643 02/19/16 2126 02/20/16 0729 02/20/16 1111  GLUCAP 141* 102* 184* 128* 139*       Signed:  Kathie Dike, MD,  [facility] 02/20/2016, 12:41 PM   By signing my name below, I, Rennis Harding, attest that this documentation has been prepared under the direction and in the presence of Kathie Dike, MD. Electronically signed: Rennis Harding, Scribe. 02/20/2016 12:30pm   I, Dr. Kathie Dike, personally performed the services described in this documentaiton. All medical record entries made by the scribe were at my direction and in my presence. I have reviewed the chart and agree that the record reflects my personal performance and is accurate and complete  Kathie Dike, MD, 02/20/2016 12:41 PM

## 2016-02-20 NOTE — Progress Notes (Signed)
Pt discharged home today per Dr. Roderic Palau.  Pt's IV site D/C'd and WDL.  Pt's VSS.  Provided with home medication list, discharge instructions and prescriptions.  Verbalized understanding.  Pt left floor via WC in stable condition accompanied by NT.

## 2016-02-20 NOTE — Care Management Note (Signed)
Case Management Note  Patient Details  Name: Susan Mahoney MRN: [id number] Date of Birth: [birth year]  Expected Discharge Date:  02/19/16               Expected Discharge Plan:  Home/Self Care  In-House Referral:  NA  Discharge planning Services  CM Consult  Post Acute Care Choice:  NA Choice offered to:  NA  DME Arranged:    DME Agency:     HH Arranged:    [place] Agency:     Status of Service:  Completed, signed off  Medicare Important Message Given:    Date Medicare IM Given:    Medicare IM give by:    Date Additional Medicare IM Given:    Additional Medicare Important Message give by:     If discussed at [place] of Stay Meetings, dates discussed:    Additional Comments: Pt discharging home today with self care. Pt uninsured, discussed cost of medicaton's and Good RX coupon given to pt. Pt states she will be able to afford the cost of medications.   Sherald Barge, RN 02/20/2016, 1:20 PM

## 2016-02-20 NOTE — Progress Notes (Signed)
She had biopsy done yesterday. She's tolerated it okay. She has no new complaints. Biopsy results are not back yet and probably will not be back until tomorrow or later. It might be reasonable to have her go ahead and see oncology before discharge at least to arrange follow-up. I will plan to sign off at this point. Thanks for allowing me to see her with you

## 2016-02-24 ENCOUNTER — Encounter (HOSPITAL_COMMUNITY): Payer: Self-pay | Admitting: Internal Medicine

## 2016-02-28 ENCOUNTER — Encounter (HOSPITAL_COMMUNITY): Payer: Medicaid Other | Attending: Hematology & Oncology | Admitting: Hematology & Oncology

## 2016-02-28 ENCOUNTER — Encounter (HOSPITAL_COMMUNITY): Payer: Self-pay | Admitting: Hematology & Oncology

## 2016-02-28 ENCOUNTER — Other Ambulatory Visit (HOSPITAL_COMMUNITY): Payer: PRIVATE HEALTH INSURANCE

## 2016-02-28 VITALS — BP 124/67 | HR 117 | Temp 98.2°F | Resp 18 | Ht <= 58 in | Wt 177.8 lb

## 2016-02-28 DIAGNOSIS — I10 Essential (primary) hypertension: Secondary | ICD-10-CM | POA: Insufficient documentation

## 2016-02-28 DIAGNOSIS — Z8571 Personal history of Hodgkin lymphoma: Secondary | ICD-10-CM | POA: Insufficient documentation

## 2016-02-28 DIAGNOSIS — R918 Other nonspecific abnormal finding of lung field: Secondary | ICD-10-CM

## 2016-02-28 DIAGNOSIS — E538 Deficiency of other specified B group vitamins: Secondary | ICD-10-CM

## 2016-02-28 DIAGNOSIS — Z9889 Other specified postprocedural states: Secondary | ICD-10-CM | POA: Insufficient documentation

## 2016-02-28 DIAGNOSIS — G893 Neoplasm related pain (acute) (chronic): Secondary | ICD-10-CM | POA: Diagnosis not present

## 2016-02-28 DIAGNOSIS — F17210 Nicotine dependence, cigarettes, uncomplicated: Secondary | ICD-10-CM | POA: Insufficient documentation

## 2016-02-28 DIAGNOSIS — C3412 Malignant neoplasm of upper lobe, left bronchus or lung: Secondary | ICD-10-CM

## 2016-02-28 DIAGNOSIS — D509 Iron deficiency anemia, unspecified: Secondary | ICD-10-CM | POA: Diagnosis not present

## 2016-02-28 DIAGNOSIS — D649 Anemia, unspecified: Secondary | ICD-10-CM

## 2016-02-28 DIAGNOSIS — Z90710 Acquired absence of both cervix and uterus: Secondary | ICD-10-CM | POA: Insufficient documentation

## 2016-02-28 DIAGNOSIS — E118 Type 2 diabetes mellitus with unspecified complications: Secondary | ICD-10-CM | POA: Insufficient documentation

## 2016-02-28 DIAGNOSIS — R7989 Other specified abnormal findings of blood chemistry: Secondary | ICD-10-CM

## 2016-02-28 HISTORY — DX: Deficiency of other specified B group vitamins: E53.8

## 2016-02-28 HISTORY — DX: Other specified abnormal findings of blood chemistry: R79.89

## 2016-02-28 MED ORDER — CYANOCOBALAMIN 1000 MCG/ML IJ SOLN
INTRAMUSCULAR | Status: AC
  Filled 2016-02-28: qty 1

## 2016-02-28 MED ORDER — CYANOCOBALAMIN 1000 MCG/ML IJ SOLN
1000.0000 ug | Freq: Once | INTRAMUSCULAR | Status: AC
  Administered 2016-02-28: 1000 ug via INTRAMUSCULAR

## 2016-02-28 MED ORDER — HYDROCODONE-ACETAMINOPHEN 5-325 MG PO TABS: 1.0000 | ORAL_TABLET | ORAL | Status: DC | PRN

## 2016-02-28 NOTE — Progress Notes (Signed)
[place] NOTE  Patient Care Team: [facility] as PCP - General  CHIEF COMPLAINTS/PURPOSE OF CONSULTATION:     Pancoast tumor of left lung ([place])   02/16/2016 - 02/20/2016 Hospital Admission Symptomatic anemia, lung mass   02/17/2016 Imaging CT chest with 9.6 x 6.1 x 6.6 cm macrolobulated mass in LUL with evidence of direct chest wall invasion, surrounding opacities ? early lymphangitic spread. No mediastinal or hilar LAD. pleural based lesion R apex prob cystic present 09/19/2013   02/18/2016 Procedure Colonoscopy with diverticulosis, five polyps   02/19/2016 Pathology Results poorly diff malignancy, unclear phenotype. carcinoma and melanoma are ruled out, calretinin positive staining may be seen in mesotheliomas in addition to other tumors   02/19/2016 Initial Biopsy Ct biopsy in IR   02/19/2016 Procedure EGD medium sized hiatal hernia, non obstructing schatzki ring, esophagitis    HISTORY OF PRESENTING ILLNESS:  Susan Mahoney 65 y.o. female is here because of newly diagnosed lung cancer of the Left upper lung. She comes in to the [place] today accompanied by her husband, Shanon Brow.  In terms of the history of her diagnosis, she begins by noting that her birthday was in [birth year]. She says "now I know I'm a diabetic," but she had a luncheon with her friends and had a wonderful time. She remembers it all started when she kept looking back and noticing that she hadn't really felt good since her birthday.  About the third weekend into April, she came home one afternoon from working at her job at [employer] in [place], and noticed that her L underarm was hurting. After that, typically on days when she'd been working (from 11-4 or 11-5), she'd sit down to relax and notice the pain in her L underarm again, even more so. Every once in a while at work, she'd also feel a little pain, but just play it off. She notes too that she'd have a little chronic  pain in her right knee from time to time, popping tylenol every so often to take care of this and keep going at work. However again she repeats that she'd feel the pain on her L underarm more and more. In addition to this, when she'd be trying to sleep at night, she'd feel pressure on that side, pain, and be unable to sleep either on her L side or on her belly as she prefers. It slowly began to hurt more. She adds that she was often hoarse in the mornings during this time, and would spit up congestion with blood in it. She played this off as allergies, or a "cold breaking up."  She had an appointment May first with her PCP. She mentioned her symptoms to her doctor at the center two different times, both the appointment 5 months prior and during her recent appointment on the first of May. She notes that the PA checked her lung and noted she "sounded ok." Mrs. Hallmark also mentioned the pain under her arm along with her cough/congestion, and was given a prednisone shot for her pain. She notes that this shot helped for a couple of weeks, but that she was still feeling wiped out. On the 31st of May, Sunday evening, she had been working all day. She came home and noted to her husband that "the pain had gotten so worse that tears were coming to her eyes." She adds that the pain was shooting, travelling all the way down her bicep. At that  time, her husband asked if she wanted to go to the ER, and Mrs. Ciriello said yes.  She obtained X-rays during her visit in the ER which, as she recalls, was told "showed a lump which could explain her pain." "They described it from what I could remember as 'a mass,' that's what they kept calling it."   Regarding the news that she has cancer, she becomes visibly calmly emotional, but is also very measured and clearly trying to control her reaction. Her husband notes "we've been through this before," revealing that he had "lymph node cancer." Mr. Shelden surgeon was Dr. Jearld Fenton.  As  an aside, Mrs. Suppa hopes that her anemia can also be controlled  She notes that she gets mammograms regularly, with her most recent one in April. She does it in a mobile unit that comes out to [facility]. Her mammogram was good.  She says she'd lost 5 pounds the first of May, and had lost 3 more pounds since then. She comments that she's been eating, and that her appetite is about 75%. She definitely notes that her appetite "ain't like it once was."  During the physical exam, she notes that she takes her pain pills during the day, and at night. She remarks that she can sleep but she can't sleep on her left side or her stomach anymore. She sleeps "real good" in her recliner, because it takes the pressure off of her left side.  She is here today for further discussion and treatment recommendations.  MEDICAL HISTORY:  Past Medical History  Diagnosis Date  . Hypertension   . Sinus infection   . Diabetes mellitus, type 2 ([place])   . Obesity   . Anemia   . Tobacco abuse     SURGICAL HISTORY: Past Surgical History  Procedure Laterality Date  . Abdominal hysterectomy    . Esophagogastroduodenoscopy N/A 02/18/2016    Procedure: ESOPHAGOGASTRODUODENOSCOPY (EGD);  Surgeon: Daneil Dolin, MD;  Location: [facility];  Service: Endoscopy;  Laterality: N/A;  . Colonoscopy N/A 02/18/2016    Procedure: COLONOSCOPY;  Surgeon: Daneil Dolin, MD;  Location: [facility];  Service: Endoscopy;  Laterality: N/A;    SOCIAL HISTORY: Social History   Social History  . Marital Status: Married    Spouse Name: N/A  . Number of Children: N/A  . Years of Education: N/A   Occupational History  . Not on file.   Social History Main Topics  . Smoking status: Current Every Day Smoker -- 0.50 packs/day for 30 years    Types: Cigarettes  . Smokeless tobacco: Never Used  . Alcohol Use: No  . Drug Use: No  . Sexual Activity: Yes    Birth Control/ Protection: Surgical   Other Topics Concern  .  Not on file   Social History Narrative   Born in [place] Married 61 years in September of this year. 1 child, daughter. 2 grandchildren, both grandsons.  Smokes half a pack a day. Started smoking in her early 20's. Denies problems with alcohol.  Hobbies include reading, sewing, crafting.  FAMILY HISTORY: Family History  Problem Relation Age of Onset  . Cancer Father    Mother & father deceased. Mother was 44; died from natural causes. Father died of colon cancer, was 72-59. 5 sisters. Oldest is diabetic. Baby sister is borderline diabetic. All of them have some form of heart disease. All on some form of blood pressure medicine. Mother also had high blood pressure; her mother had  it too.  ALLERGIES:  is allergic to ace inhibitors and indomethacin.  MEDICATIONS:  Current Outpatient Prescriptions  Medication Sig Dispense Refill  . acetaminophen (TYLENOL) 500 MG tablet Take 1,000 mg by mouth every 6 (six) hours as needed for mild pain or moderate pain.     Marland Kitchen albuterol (PROVENTIL HFA;VENTOLIN HFA) 108 (90 BASE) MCG/ACT inhaler Inhale 1-2 puffs into the lungs every 6 (six) hours as needed for wheezing or shortness of breath. 1 Inhaler 0  . Cetirizine HCl 10 MG CAPS Take 1 capsule (10 mg total) by mouth at bedtime. Restart after you've completed a three-day course of Benadryl.    . ferrous gluconate (FERGON) 324 MG tablet Take 1 tablet (324 mg total) by mouth 2 (two) times daily with a meal. 60 tablet 3  . glimepiride (AMARYL) 1 MG tablet Take 1 tablet daily if your blood sugar is 200 and over. Further instructions per your primary care provider. (Patient taking differently: 5 mg. Take 1 tablet daily if your blood sugar is 200 and over. Further instructions per your primary care provider.) 30 tablet 1  . HYDROcodone-acetaminophen (NORCO) 5-325 MG tablet Take 1-2 tablets by mouth every 4 (four) hours as needed for moderate pain. 90 tablet 0  . pantoprazole (PROTONIX) 40 MG tablet Take  1 tablet (40 mg total) by mouth 2 (two) times daily before a meal. 60 tablet 1  . triamterene-hydrochlorothiazide (DYAZIDE) 37.5-25 MG per capsule Take 1 each (1 capsule total) by mouth daily.    . Cholecalciferol (VITAMIN D PO) Take 1 tablet by mouth daily. Reported on 02/28/2016    . diphenhydrAMINE (BENADRYL) 25 MG tablet Take 1 tablet (25 mg total) by mouth 2 (two) times daily. Take for 3 more days. (Patient not taking: Reported on 02/28/2016)    . oxymetazoline (NASAL SPRAY 12 HOUR) 0.05 % nasal spray Place 1 spray into both nostrils daily as needed for congestion. Reported on 02/28/2016     No current facility-administered medications for this visit.    Review of Systems  Constitutional: Positive for weight loss. Negative for malaise/fatigue and diaphoresis.       Mild weight loss since February, ~8 pounds.  HENT: Positive for congestion. Negative for hearing loss and tinnitus.   Eyes: Negative.  Negative for blurred vision, double vision, photophobia, pain, discharge and redness.  Respiratory: Positive for cough, hemoptysis and sputum production. Negative for shortness of breath and wheezing.        Cough and congestion in the morning, with some bloody sputum in the morning at times.  Cardiovascular: Negative.  Negative for chest pain, palpitations, orthopnea, claudication, leg swelling and PND.  Gastrointestinal: Negative.  Negative for heartburn, nausea, vomiting, abdominal pain, diarrhea, constipation, blood in stool and melena.       Decreased appetite, patient reports down to about 75%.  Genitourinary: Negative.  Negative for dysuria, urgency, frequency, hematuria and flank pain.  Musculoskeletal: Positive for joint pain. Negative for myalgias, back pain, falls and neck pain.       Pain in the L underarm. Chronic R knee pain.  Skin: Negative.  Negative for itching and rash.  Neurological: Negative.  Negative for dizziness, tingling, tremors, sensory change, speech change, focal weakness,  seizures, loss of consciousness, weakness and headaches.  Endo/Heme/Allergies: Negative.   Psychiatric/Behavioral: Negative.  Negative for depression, suicidal ideas, hallucinations, memory loss and substance abuse. The patient is not nervous/anxious and does not have insomnia.   All other systems reviewed and are negative.  [place]  point ROS was done and is otherwise as detailed above or in HPI   PHYSICAL EXAMINATION: ECOG PERFORMANCE STATUS: 1 - Symptomatic but completely ambulatory  Filed Vitals:   02/28/16 1354  BP: 124/67  Pulse: 117  Temp: 98.2 F (36.8 C)  Resp: 18   Filed Weights   02/28/16 1354  Weight: 177 lb 12.8 oz (80.65 kg)    Physical Exam  Constitutional: She is oriented to person, place, and time and well-developed, well-nourished, and in no distress.  HENT:  Head: Normocephalic and atraumatic.  Nose: Nose normal.  Mouth/Throat: Oropharynx is clear and moist. No oropharyngeal exudate.  Eyes: Conjunctivae and EOM are normal. Pupils are equal, round, and reactive to light. Right eye exhibits no discharge. Left eye exhibits no discharge. No scleral icterus.  Neck: Normal range of motion. Neck supple. No tracheal deviation present. No thyromegaly present.  Cardiovascular: Normal rate, regular rhythm, normal heart sounds and intact distal pulses.  Exam reveals no gallop and no friction rub.   No murmur heard. Pulmonary/Chest: Effort normal and breath sounds normal. She has no wheezes. She has no rales.  Abdominal: Soft. Bowel sounds are normal. She exhibits no distension and no mass. There is no tenderness. There is no rebound and no guarding.  Musculoskeletal: Normal range of motion. She exhibits no edema.  Lymphadenopathy:    She has no cervical adenopathy.  Neurological: She is alert and oriented to person, place, and time. She has normal reflexes. No cranial nerve deficit. Gait normal. Coordination normal.  Skin: Skin is warm and dry. No rash noted.  Psychiatric:  Mood, memory, affect and judgment normal.  Nursing note and vitals reviewed.   LABORATORY DATA:  I have reviewed the data as listed Lab Results  Component Value Date   WBC 9.4 02/20/2016   HGB 8.3* 02/20/2016   HCT 28.0* 02/20/2016   MCV 70.9* 02/20/2016   PLT 305 02/20/2016   CMP     Component Value Date/Time   NA 141 02/19/2016 0510   K 4.1 02/19/2016 0510   CL 111 02/19/2016 0510   CO2 25 02/19/2016 0510   GLUCOSE 121* 02/19/2016 0510   BUN 29* 02/19/2016 0510   CREATININE 1.35* 02/19/2016 0510   CALCIUM 8.5* 02/19/2016 0510   PROT 7.7 09/03/2013 0443   ALBUMIN 3.6 09/03/2013 0443   AST 21 09/03/2013 0443   ALT 27 09/03/2013 0443   ALKPHOS 105 09/03/2013 0443   BILITOT 0.2* 09/03/2013 0443   GFRNONAA 41* 02/19/2016 0510   GFRAA 47* 02/19/2016 0510     RADIOGRAPHIC STUDIES: I have personally reviewed the radiological images as listed and agreed with the findings in the report. No results found.   Study Result     CLINICAL DATA: Status post left lung biopsy.  EXAM: CHEST 1 VIEW  COMPARISON: None.  FINDINGS: The heart size and mediastinal contours are within normal limits. No pneumothorax is noted. Right peritracheal mass is noted consistent with adenopathy. Large left upper lobe mass is again noted. No pleural effusion is noted. The visualized skeletal structures are unremarkable.  IMPRESSION: No pneumothorax status post biopsy of left upper lobe lung mass.   Electronically Signed  By: Marijo Conception, M.D.  On: 02/19/2016 15:31    Study Result     INDICATION: 65 year old female with a history of a left chest wall mass  EXAM: CT BIOPSY  MEDICATIONS: None.  ANESTHESIA/SEDATION: Moderate (conscious) sedation was employed during this procedure. A total of Versed 1.5 mg and Fentanyl  75 mcg was administered intravenously.  Moderate Sedation Time: 11 minutes. The patient's level of consciousness and vital signs were  monitored continuously by radiology nursing throughout the procedure under my direct supervision.  FLUOROSCOPY TIME: CT  COMPLICATIONS: None  PROCEDURE: The procedure, risks, benefits, and alternatives were explained to the patient and the patient's family. Specific risks that were addressed included bleeding, infection, pneumothorax, need for further procedure including chest tube placement, chance of delayed pneumothorax or hemorrhage, hemoptysis, nondiagnostic sample, cardiopulmonary collapse, death. Questions regarding the procedure were encouraged and answered. The patient understands and consents to the procedure.  Patient was positioned in the supine position on the CT gantry table and a scout CT of the chest was performed for planning purposes.  Once angle of approach was determined, the skin and subcutaneous tissues this scan was prepped and draped in the usual sterile fashion, and a sterile drape was applied covering the operative field. A sterile gown and sterile gloves were used for the procedure. Local anesthesia was provided with 1% Lidocaine.  The skin and subcutaneous tissues were infiltrated 1% lidocaine for local anesthesia, and a small stab incision was made with an 11 blade scalpel.  Using CT guidance, a 17 gauge trocar needle was advanced into the left chest wall mass. After confirmation of the tip, separate 18 gauge core biopsies were performed. These were placed into solution for transportation to the lab.  Patient tolerated the procedure well and remained hemodynamically stable throughout.  No complications were encountered and no significant blood loss was encountered.  IMPRESSION: Status post CT-guided biopsy of left chest wall mass. Tissue specimen sent to pathology for complete histopathologic analysis.  Signed,  Dulcy Fanny. Earleen Newport, DO  [facility]  [facility]  Radiology   Electronically Signed  By: Corrie Mckusick D.O.  On: 02/19/2016 17:48    Study Result     CLINICAL DATA: 65 year old female with possible lung mass noted on recent left shoulder radiographs.  EXAM: CT CHEST WITH CONTRAST  TECHNIQUE: Multidetector CT imaging of the chest was performed during intravenous contrast administration.  CONTRAST: 48m ISOVUE-300 IOPAMIDOL (ISOVUE-300) INJECTION 61%  COMPARISON: Chest CT 09/19/2013. Left shoulder radiograph 02/17/2016.  FINDINGS: Mediastinum/Lymph Nodes: Heart size is mildly enlarged. There is no significant pericardial fluid, thickening or pericardial calcification. No pathologically enlarged mediastinal or hilar lymph nodes. Esophagus is unremarkable in appearance. No axillary lymphadenopathy. Multiple heterogeneous appearing thyroid nodules, largest of which measures 2.0 x 1.1 cm in the left lobe of the thyroid gland.  Lungs/Pleura: In the left upper lobe there is a macrolobulated mass measuring 9.6 x 6.1 x 6.6 cm (axial image 32 of series 2 and coronal image 81 of series 4). This lesion makes a broad with the overlying pleura and clearly demonstrates direct chest wall invasion in the first and second intercostal spaces. There is a tiny focus of cavitation in the superior aspect of the lesion (image 22 of series 2). Adjacent to the lesion in the medial aspect of the left upper lobe there is extensive ground-glass attenuation and septal thickening, which could reflect some lymphangitic spread of disease, postobstructive changes secondary to extrinsic mass effect upon left upper lobe bronchi, or may simply reflect perilesional hemorrhage. In the apex of the right hemithorax there is a pleural-based lesion which is centrally low-attenuation (18 HU), which is larger than prior study 09/19/2013, currently measuring 3.7 x 2.6 x 2.7 cm (previously 2.2 x 1.6 x 1.1 cm). This lesion remains smoothly marginated.  No pleural  effusions.  Upper Abdomen: Low-attenuation lesions right kidney compatible with simple cysts, measuring up to 1.4 cm in the upper pole.  Musculoskeletal/Soft Tissues: There are no aggressive appearing lytic or blastic lesions noted in the visualized portions of the skeleton.  IMPRESSION: 1. 9.6 x 6.1 x 6.6 cm macrolobulated mass in the left upper lobe with evidence of direct chest wall invasion, and surrounding opacities in the left upper lobe which are favored to reflect some early lymphangitic spread of disease (but may alternatively simply reflect postobstructive changes or perilesional hemorrhage). No mediastinal or hilar lymphadenopathy noted at this time. Further evaluation with biopsy and/or PET-CT is recommended in the near future for diagnostic and staging purposes. 2. In addition, there is a smoothly marginated pleural-based lesion in the apex of the right hemithorax which is slightly larger than prior study 09/19/2013 and is low-attenuation, suggesting a cystic lesion. This is favored to be benign (potentially a neurenteric cyst or benign peripheral nerve sheath tumor), but attention at the time of follow-up PET-CT is suggested. 3. Mild cardiomegaly. 4. Additional incidental findings, as above.   Electronically Signed  By: Vinnie Langton M.D.  On: 02/17/2016 18:54      ASSESSMENT & PLAN:  LUL pancoast tumor with chest wall invasion Symptomatic Anemia Iron deficiency  Low B12 Cancer related pain  I have arranged for PET imaging. Pathology is certainly not "straightforward" and ultimately will present at tumor board. I did discuss her case with Dr. Tammi Klippel today.   I reviewed imaging and pathology with the patient and her husband in detail today. We broadly reviewed treatment course as well.   She has iron deficiency anemia, B12 is likewise low. Prior to any therapy I advised her we need to correct her anemia. She received one dose of  feraheme as an inpatient. I have started B12 replacement today. We will recheck a CBC prior to departure from the clinic today.  I have refilled her pain medication. We discussed adding a long acting pain medication. She is willing to consider this and we will address again at follow-up.   ORDERS PLACED FOR THIS ENCOUNTER: Orders Placed This Encounter  Procedures  . NM PET Image Initial (PI) Skull Base To Thigh  . CBC with Differential    MEDICATIONS PRESCRIBED THIS ENCOUNTER:  Meds ordered this encounter  Medications  . HYDROcodone-acetaminophen (NORCO) 5-325 MG tablet    Sig: Take 1-2 tablets by mouth every 4 (four) hours as needed for moderate pain.    Dispense:  90 tablet    Refill:  0  . cyanocobalamin ((VITAMIN B-12)) injection 1,000 mcg    Sig:    All questions were answered. The patient knows to call the clinic with any problems, questions or concerns.  This document serves as a record of services personally performed by Ancil Linsey, MD. It was created on her behalf by Toni Amend, a trained medical scribe. The creation of this record is based on the scribe's personal observations and the provider's statements to them. This document has been checked and approved by the attending provider.  I have reviewed the above documentation for accuracy and completeness and I agree with the above.  This note was electronically signed.    Molli Hazard, MD  03/03/2016 6:22 PM

## 2016-02-28 NOTE — Progress Notes (Signed)
Susan Mahoney presents today for injection per MD orders. B12 1034mg administered IM in right Upper Arm by CHeber CarolinaRN. Administration without incident. Patient tolerated well.

## 2016-02-28 NOTE — Patient Instructions (Signed)
[place] [facility] Discharge Instructions  RECOMMENDATIONS MADE BY THE CONSULTANT AND ANY TEST RESULTS WILL BE SENT TO YOUR REFERRING PHYSICIAN.  We will do lab work today - checking your complete blood count  Vitamin B12 injections to be given once a week x 4 weeks -- first injection today  PET scan to be set up  Return to clinic after the PET scan.     Thank you for choosing [place] at [facility] to provide your oncology and hematology care.  To afford each patient quality time with our provider, please arrive at least 15 minutes before your scheduled appointment time.   Beginning January 23rd 2017 lab work for the [facility] will be done in the  Main lab at [facility] on 1st floor. If you have a lab appointment with the [facility] please come in thru the  Main Entrance and check in at the main information desk  You need to re-schedule your appointment should you arrive 10 or more minutes late.  We strive to give you quality time with our providers, and arriving late affects you and other patients whose appointments are after yours.  Also, if you no show three or more times for appointments you may be dismissed from the clinic at the providers discretion.     Again, thank you for choosing [facility].  Our hope is that these requests will decrease the amount of time that you wait before being seen by our physicians.       _____________________________________________________________  Should you have questions after your visit to [facility], please contact our office at [phone] between the hours of 8:30 a.m. and 4:30 p.m.  Voicemails left after 4:30 p.m. will not be returned until the following business day.  For prescription refill requests, have your pharmacy contact our office.         Resources For Cancer Patients and their Caregivers ? American Cancer Society: Can assist with  transportation, wigs, general needs, runs Look Good Feel Better.        7177267993 ? Cancer Care: Provides financial assistance, online support groups, medication/co-pay assistance.  1-800-813-HOPE (719) 110-0352) ? [facility] Assists [place] cancer patients and their families through emotional , educational and financial support.  845-525-3674 ? [place] DSS Where to apply for food stamps, Medicaid and utility assistance. 7132035301 ? RCATS: Transportation to medical appointments. (646) 820-6461 ? Social Security Administration: May apply for disability if have a Stage IV cancer. 918-024-1249 (551) 171-5418 ? [employer], Disability and Transit Services: Assists with nutrition, care and transit needs. [place] Support Programs: '@10RELATIVEDAYS'$ @ > Cancer Support Group  2nd Tuesday of the month 1pm-2pm, Journey Room  > Creative Journey  3rd Tuesday of the month 1130am-1pm, Journey Room  > Look Good Feel Better  1st Wednesday of the month 10am-12 noon, Journey Room (Call American Cancer Society to register [phone])  PET Scan A PET scan, also called positron emission tomography, is a test that creates pictures of the inside of the body. A PET scan requires a small dose of a harmless radioactive material to be injected into a vein. When this material combines with certain substances in the body, it produces tiny particles that can be detected by a scanner and converted into pictures.  The pictures created during a PET scan can be used to study a disease. They are often used to study  cancer and cancer therapy. The colors and brightness on the pictures show different levels of organ and tissue function. For example, cancer tissue appears brighter than normal tissue on a PET scan picture. LET [facility] CARE PROVIDER KNOW ABOUT:   Any allergies you have.  All medicines you are taking, including vitamins, herbs, eye drops,  creams, and over-the-counter medicines.  Previous problems you or members of your family have had with the use of anesthetics.  Any blood disorders you have.  Previous surgeries you have had.  Medical conditions you have.  If you are afraid of cramped spaces (claustrophobic). If claustrophobia is a problem, it usually can be relieved with mild sedatives or antianxiety medicines.  If you have trouble staying still for long periods of time. BEFORE THE PROCEDURE   Do not eat or drink anything after midnight on the night before the procedure or as directed by your health care provider.  Take medicines only as directed by your health care provider.  If you have diabetes, ask your health care provider for diet guidelines to control sugar (glucose) levels on the day of the test. PROCEDURE   A small amount of radioactive material will be injected into a vein. The test will begin 30-60 minutes after the injection, when the material has traveled around your body.  You will lie on a cushioned table, and the table will be moved through the center of a machine that looks like a large donut. It will take about 30-60 minutes for the machine to produce pictures of your body. You will need to stay still during this time. AFTER THE PROCEDURE  You may resume your normal diet and activities.  Drink several 8 oz glasses of water following the test to flush the radioactive material out of your body.   This information is not intended to replace advice given to you by your health care provider. Make sure you discuss any questions you have with your health care provider.   Document Released: 03/14/2003 Document Revised: 09/28/2014 Document Reviewed: 12/20/2013 Elsevier Interactive Patient Education 2016 Elsevier Inc. Cyanocobalamin, Vitamin B12 injection What is this medicine? CYANOCOBALAMIN (sye an oh koe BAL a min) is a man made form of vitamin B12. Vitamin B12 is used in the growth of healthy blood  cells, nerve cells, and proteins in the body. It also helps with the metabolism of fats and carbohydrates. This medicine is used to treat people who can not absorb vitamin B12. This medicine may be used for other purposes; ask your health care provider or pharmacist if you have questions. What should I tell my health care provider before I take this medicine? They need to know if you have any of these conditions: -kidney disease -Leber's disease -megaloblastic anemia -an unusual or allergic reaction to cyanocobalamin, cobalt, other medicines, foods, dyes, or preservatives -pregnant or trying to get pregnant -breast-feeding How should I use this medicine? This medicine is injected into a muscle or deeply under the skin. It is usually given by a health care professional in a clinic or doctor's office. However, your doctor may teach you how to inject yourself. Follow all instructions. Talk to your pediatrician regarding the use of this medicine in children. Special care may be needed. Overdosage: If you think you have taken too much of this medicine contact a poison control center or emergency room at once. NOTE: This medicine is only for you. Do not share this medicine with others. What if I miss a dose? If you  are given your dose at a clinic or doctor's office, call to reschedule your appointment. If you give your own injections and you miss a dose, take it as soon as you can. If it is almost time for your next dose, take only that dose. Do not take double or extra doses. What may interact with this medicine? -colchicine -heavy alcohol intake This list may not describe all possible interactions. Give your health care provider a list of all the medicines, herbs, non-prescription drugs, or dietary supplements you use. Also tell them if you smoke, drink alcohol, or use illegal drugs. Some items may interact with your medicine. What should I watch for while using this medicine? Visit your doctor or  health care professional regularly. You may need blood work done while you are taking this medicine. You may need to follow a special diet. Talk to your doctor. Limit your alcohol intake and avoid smoking to get the best benefit. What side effects may I notice from receiving this medicine? Side effects that you should report to your doctor or health care professional as soon as possible: -allergic reactions like skin rash, itching or hives, swelling of the face, lips, or tongue -blue tint to skin -chest tightness, pain -difficulty breathing, wheezing -dizziness -red, swollen painful area on the leg Side effects that usually do not require medical attention (report to your doctor or health care professional if they continue or are bothersome): -diarrhea -headache This list may not describe all possible side effects. Call your doctor for medical advice about side effects. You may report side effects to FDA at 1-800-FDA-1088. Where should I keep my medicine? Keep out of the reach of children. Store at room temperature between 15 and 30 degrees C (59 and 85 degrees F). Protect from light. Throw away any unused medicine after the expiration date. NOTE: This sheet is a summary. It may not cover all possible information. If you have questions about this medicine, talk to your doctor, pharmacist, or health care provider.    2016, Elsevier/Gold Standard. (2007-12-19 22:10:20)

## 2016-03-03 ENCOUNTER — Encounter (HOSPITAL_COMMUNITY): Payer: Self-pay | Admitting: Hematology & Oncology

## 2016-03-03 DIAGNOSIS — C3412 Malignant neoplasm of upper lobe, left bronchus or lung: Secondary | ICD-10-CM | POA: Insufficient documentation

## 2016-03-03 HISTORY — DX: Malignant neoplasm of upper lobe, left bronchus or lung: C34.12

## 2016-03-04 ENCOUNTER — Encounter (HOSPITAL_BASED_OUTPATIENT_CLINIC_OR_DEPARTMENT_OTHER): Payer: Medicaid Other

## 2016-03-04 ENCOUNTER — Encounter (HOSPITAL_COMMUNITY): Payer: Self-pay

## 2016-03-04 ENCOUNTER — Encounter (HOSPITAL_COMMUNITY): Payer: Medicaid Other

## 2016-03-04 DIAGNOSIS — E538 Deficiency of other specified B group vitamins: Secondary | ICD-10-CM

## 2016-03-04 DIAGNOSIS — C3412 Malignant neoplasm of upper lobe, left bronchus or lung: Secondary | ICD-10-CM | POA: Diagnosis not present

## 2016-03-04 DIAGNOSIS — E118 Type 2 diabetes mellitus with unspecified complications: Secondary | ICD-10-CM | POA: Diagnosis not present

## 2016-03-04 DIAGNOSIS — F17210 Nicotine dependence, cigarettes, uncomplicated: Secondary | ICD-10-CM | POA: Diagnosis not present

## 2016-03-04 DIAGNOSIS — I10 Essential (primary) hypertension: Secondary | ICD-10-CM | POA: Diagnosis not present

## 2016-03-04 DIAGNOSIS — D649 Anemia, unspecified: Secondary | ICD-10-CM

## 2016-03-04 DIAGNOSIS — Z9889 Other specified postprocedural states: Secondary | ICD-10-CM | POA: Diagnosis not present

## 2016-03-04 DIAGNOSIS — Z8571 Personal history of Hodgkin lymphoma: Secondary | ICD-10-CM | POA: Diagnosis not present

## 2016-03-04 DIAGNOSIS — Z90710 Acquired absence of both cervix and uterus: Secondary | ICD-10-CM | POA: Diagnosis not present

## 2016-03-04 DIAGNOSIS — R918 Other nonspecific abnormal finding of lung field: Secondary | ICD-10-CM

## 2016-03-04 LAB — CBC WITH DIFFERENTIAL/PLATELET
BASOS ABS: 0 10*3/uL (ref 0.0–0.1)
Basophils Relative: 0 %
EOS PCT: 2 %
Eosinophils Absolute: 0.2 10*3/uL (ref 0.0–0.7)
HEMATOCRIT: 35.9 % — AB (ref 36.0–46.0)
HEMOGLOBIN: 11.1 g/dL — AB (ref 12.0–15.0)
LYMPHS PCT: 27 %
Lymphs Abs: 2.6 10*3/uL (ref 0.7–4.0)
MCH: 21.6 pg — ABNORMAL LOW (ref 26.0–34.0)
MCHC: 30.9 g/dL (ref 30.0–36.0)
MCV: 70 fL — AB (ref 78.0–100.0)
MONOS PCT: 4 %
Monocytes Absolute: 0.4 10*3/uL (ref 0.1–1.0)
NEUTROS PCT: 67 %
Neutro Abs: 6.4 10*3/uL (ref 1.7–7.7)
Platelets: 366 10*3/uL (ref 150–400)
RBC: 5.13 MIL/uL — AB (ref 3.87–5.11)
RDW: 24.4 % — ABNORMAL HIGH (ref 11.5–15.5)
WBC MORPHOLOGY: INCREASED
WBC: 9.6 10*3/uL (ref 4.0–10.5)

## 2016-03-04 MED ORDER — CYANOCOBALAMIN 1000 MCG/ML IJ SOLN
1000.0000 ug | Freq: Once | INTRAMUSCULAR | Status: AC
  Administered 2016-03-04: 1000 ug via INTRAMUSCULAR

## 2016-03-04 MED ORDER — CYANOCOBALAMIN 1000 MCG/ML IJ SOLN: 1000.0000 ug | Freq: Once | INTRAMUSCULAR | Status: DC

## 2016-03-04 MED ORDER — CYANOCOBALAMIN 1000 MCG/ML IJ SOLN
INTRAMUSCULAR | Status: AC
  Filled 2016-03-04: qty 1

## 2016-03-04 NOTE — Patient Instructions (Signed)
[place] at [facility] Discharge Instructions  RECOMMENDATIONS MADE BY THE CONSULTANT AND ANY TEST RESULTS WILL BE SENT TO YOUR REFERRING PHYSICIAN.  b12 injection today Follow up as scheduled Please call the clinic if you have any questions or concerns   Thank you for choosing [place] at [facility] to provide your oncology and hematology care.  To afford each patient quality time with our provider, please arrive at least 15 minutes before your scheduled appointment time.   Beginning January 23rd 2017 lab work for the [facility] will be done in the  Main lab at [facility] on 1st floor. If you have a lab appointment with the [facility] please come in thru the  Main Entrance and check in at the main information desk  You need to re-schedule your appointment should you arrive 10 or more minutes late.  We strive to give you quality time with our providers, and arriving late affects you and other patients whose appointments are after yours.  Also, if you no show three or more times for appointments you may be dismissed from the clinic at the providers discretion.     Again, thank you for choosing [facility].  Our hope is that these requests will decrease the amount of time that you wait before being seen by our physicians.       _____________________________________________________________  Should you have questions after your visit to [facility], please contact our office at [phone] between the hours of 8:30 a.m. and 4:30 p.m.  Voicemails left after 4:30 p.m. will not be returned until the following business day.  For prescription refill requests, have your pharmacy contact our office.         Resources For Cancer Patients and their Caregivers ? American Cancer Society: Can assist with transportation, wigs, general needs, runs Look Good Feel Better.        267-692-0743 ? Cancer Care: Provides  financial assistance, online support groups, medication/co-pay assistance.  1-800-813-HOPE (450)354-6334) ? [facility] Assists [place] cancer patients and their families through emotional , educational and financial support.  5648256447 ? [place] DSS Where to apply for food stamps, Medicaid and utility assistance. [phone] ? RCATS: Transportation to medical appointments. (224)115-7164 ? Social Security Administration: May apply for disability if have a Stage IV cancer. [phone] 732-365-8768 ? [employer], Disability and Transit Services: Assists with nutrition, care and transit needs. [place] Support Programs: '@10RELATIVEDAYS'$ @ > Cancer Support Group  2nd Tuesday of the month 1pm-2pm, Journey Room  > Creative Journey  3rd Tuesday of the month 1130am-1pm, Journey Room  > Look Good Feel Better  1st Wednesday of the month 10am-12 noon, Journey Room (Call [place] to register [phone])

## 2016-03-04 NOTE — Progress Notes (Signed)
Susan Mahoney presents today for injection per the provider's orders.  Vitamin b12 in right arm administration without incident; see MAR for injection details.  Patient tolerated procedure well and without incident.  No questions or complaints noted at this time.

## 2016-03-05 ENCOUNTER — Ambulatory Visit (HOSPITAL_COMMUNITY): Payer: PRIVATE HEALTH INSURANCE

## 2016-03-10 ENCOUNTER — Encounter: Payer: Self-pay | Admitting: Gastroenterology

## 2016-03-10 ENCOUNTER — Ambulatory Visit (INDEPENDENT_AMBULATORY_CARE_PROVIDER_SITE_OTHER): Payer: Self-pay | Admitting: Gastroenterology

## 2016-03-10 VITALS — BP 128/78 | HR 90 | Temp 97.0°F | Ht <= 58 in | Wt 173.8 lb

## 2016-03-10 DIAGNOSIS — Z86010 Personal history of colonic polyps: Secondary | ICD-10-CM

## 2016-03-10 DIAGNOSIS — K2100 Gastro-esophageal reflux disease with esophagitis, without bleeding: Secondary | ICD-10-CM

## 2016-03-10 DIAGNOSIS — D649 Anemia, unspecified: Secondary | ICD-10-CM

## 2016-03-10 NOTE — Progress Notes (Signed)
cc'ed to pcp °

## 2016-03-10 NOTE — Assessment & Plan Note (Signed)
Discussed antireflux measures. Continue pantoprazole 40 mg daily.

## 2016-03-10 NOTE — Progress Notes (Signed)
Primary Care Physician: Vesta Mixer  Primary Gastroenterologist:  Garfield Cornea, MD   Chief Complaint  Patient presents with  . Follow-up    HPI: Susan Mahoney is a 65 y.o. female presents for recent hospital follow-up. She presented to the hospital with progressive weakness, left shoulder/axillary pain, profound microcytic anemia. She was heme positive. She was found to have a left apical pulmonary mass. During hospitalization she underwent an EGD that showed LA grade esophagitis, nonobstructing Schatzki ring, medium-sized hiatal hernia. Colonoscopy showed diverticulosis, 5 three-day millimeter polyps which were removed and were tubular adenomas. Colonoscopy planned for 3 years.  Patient is actively seen Susan Mahoney for newly diagnosed lung cancer of the left upper lung. She is also managing patient's anemia and B12 deficiency. PET is scheduled. Susan Mahoney is fully aware of pathology reports from her lung biopsy. Susan Mahoney name inadvertently got listed as the ordering provider for liver biopsy.  From a GI standpoint she feels well. Bowel movements are regular. No abdominal pain. Appetite is fair considering circumstances. Denies nausea or vomiting, dysphagia, melena, rectal bleeding.   Current Outpatient Prescriptions  Medication Sig Dispense Refill  . acetaminophen (TYLENOL) 500 MG tablet Take 1,000 mg by mouth every 6 (six) hours as needed for mild pain or moderate pain.     Marland Kitchen albuterol (PROVENTIL HFA;VENTOLIN HFA) 108 (90 BASE) MCG/ACT inhaler Inhale 1-2 puffs into the lungs every 6 (six) hours as needed for wheezing or shortness of breath. 1 Inhaler 0  . Cetirizine HCl 10 MG CAPS Take 1 capsule (10 mg total) by mouth at bedtime. Restart after you've completed a three-day course of Benadryl.    . Cholecalciferol (VITAMIN D PO) Take 1 tablet by mouth daily. Reported on 02/28/2016    . ferrous gluconate (FERGON) 324 MG tablet Take 1 tablet (324 mg total) by mouth 2  (two) times daily with a meal. 60 tablet 3  . glimepiride (AMARYL) 1 MG tablet Take 1 tablet daily if your blood sugar is 200 and over. Further instructions per your primary care provider. (Patient taking differently: 5 mg. Take 1 tablet daily if your blood sugar is 200 and over. Further instructions per your primary care provider.) 30 tablet 1  . HYDROcodone-acetaminophen (NORCO) 5-325 MG tablet Take 1-2 tablets by mouth every 4 (four) hours as needed for moderate pain. 90 tablet 0  . oxymetazoline (NASAL SPRAY 12 HOUR) 0.05 % nasal spray Place 1 spray into both nostrils daily as needed for congestion. Reported on 02/28/2016    . pantoprazole (PROTONIX) 40 MG tablet Take 1 tablet (40 mg total) by mouth 2 (two) times daily before a meal. 60 tablet 1  . triamterene-hydrochlorothiazide (DYAZIDE) 37.5-25 MG per capsule Take 1 each (1 capsule total) by mouth daily.     No current facility-administered medications for this visit.    Allergies as of 03/10/2016 - Review Complete 03/10/2016  Allergen Reaction Noted  . Ace inhibitors Swelling 09/02/2013  . Indomethacin  02/16/2016    ROS:  General: Negative for anorexia, weight loss, fever, chills, fatigue, weakness. ENT: Negative for hoarseness, difficulty swallowing , nasal congestion. CV: Negative for chest pain, angina, palpitations, dyspnea on exertion, peripheral edema.  Respiratory: Negative for dyspnea at rest, dyspnea on exertion, cough, sputum, wheezing.  GI: See history of present illness. GU:  Negative for dysuria, hematuria, urinary incontinence, urinary frequency, nocturnal urination.  Endo: Negative for unusual weight change.    Physical Examination:   BP 128/78  mmHg  Pulse 90  Temp(Src) 97 F (36.1 C)  Ht '4\' 8"'$  (1.422 m)  Wt 173 lb 12.8 oz (78.835 kg)  BMI 38.99 kg/m2  General: Well-nourished, well-developed in no acute distress.  Eyes: No icterus. Mouth: Oropharyngeal mucosa moist and pink , no lesions erythema or  exudate. Lungs: Clear to auscultation bilaterally.  Heart: Regular rate and rhythm, no murmurs rubs or gallops.  Abdomen: Bowel sounds are normal, nontender, nondistended, no hepatosplenomegaly or masses, no abdominal bruits or hernia , no rebound or guarding.   Extremities: No lower extremity edema. No clubbing or deformities. Neuro: Alert and oriented x 4   Skin: Warm and dry, no jaundice.   Psych: Alert and cooperative, normal mood and affect.  Labs:  Lab Results  Component Value Date   CREATININE 1.35* 02/19/2016   BUN 29* 02/19/2016   NA 141 02/19/2016   K 4.1 02/19/2016   CL 111 02/19/2016   CO2 25 02/19/2016   Lab Results  Component Value Date   ALT 27 09/03/2013   AST 21 09/03/2013   ALKPHOS 105 09/03/2013   BILITOT 0.2* 09/03/2013   Lab Results  Component Value Date   WBC 9.6 03/04/2016   HGB 11.1* 03/04/2016   HCT 35.9* 03/04/2016   MCV 70.0* 03/04/2016   PLT 366 03/04/2016   Lab Results  Component Value Date   VITAMINB12 233 02/17/2016   Lab Results  Component Value Date   FOLATE 17.6 02/17/2016    Imaging Studies: Dg Chest 1 View  02/19/2016  CLINICAL DATA:  Status post left lung biopsy. EXAM: CHEST 1 VIEW COMPARISON:  None. FINDINGS: The heart size and mediastinal contours are within normal limits. No pneumothorax is noted. Right peritracheal mass is noted consistent with adenopathy. Large left upper lobe mass is again noted. No pleural effusion is noted. The visualized skeletal structures are unremarkable. IMPRESSION: No pneumothorax status post biopsy of left upper lobe lung mass. Electronically Signed   By: Marijo Conception, M.D.   On: 02/19/2016 15:31   Ct Chest W Contrast  02/17/2016  CLINICAL DATA:  65 year old female with possible lung mass noted on recent left shoulder radiographs. EXAM: CT CHEST WITH CONTRAST TECHNIQUE: Multidetector CT imaging of the chest was performed during intravenous contrast administration. CONTRAST:  8m ISOVUE-300  IOPAMIDOL (ISOVUE-300) INJECTION 61% COMPARISON:  Chest CT 09/19/2013. Left shoulder radiograph 02/17/2016. FINDINGS: Mediastinum/Lymph Nodes: Heart size is mildly enlarged. There is no significant pericardial fluid, thickening or pericardial calcification. No pathologically enlarged mediastinal or hilar lymph nodes. Esophagus is unremarkable in appearance. No axillary lymphadenopathy. Multiple heterogeneous appearing thyroid nodules, largest of which measures 2.0 x 1.1 cm in the left lobe of the thyroid gland. Lungs/Pleura: In the left upper lobe there is a macrolobulated mass measuring 9.6 x 6.1 x 6.6 cm (axial image 32 of series 2 and coronal image 81 of series 4). This lesion makes a broad with the overlying pleura and clearly demonstrates direct chest wall invasion in the first and second intercostal spaces. There is a tiny focus of cavitation in the superior aspect of the lesion (image 22 of series 2). Adjacent to the lesion in the medial aspect of the left upper lobe there is extensive ground-glass attenuation and septal thickening, which could reflect some lymphangitic spread of disease, postobstructive changes secondary to extrinsic mass effect upon left upper lobe bronchi, or may simply reflect perilesional hemorrhage. In the apex of the right hemithorax there is a pleural-based lesion which is centrally low-attenuation (18  HU), which is larger than prior study 09/19/2013, currently measuring 3.7 x 2.6 x 2.7 cm (previously 2.2 x 1.6 x 1.1 cm). This lesion remains smoothly marginated. No pleural effusions. Upper Abdomen: Low-attenuation lesions right kidney compatible with simple cysts, measuring up to 1.4 cm in the upper pole. Musculoskeletal/Soft Tissues: There are no aggressive appearing lytic or blastic lesions noted in the visualized portions of the skeleton. IMPRESSION: 1. 9.6 x 6.1 x 6.6 cm macrolobulated mass in the left upper lobe with evidence of direct chest wall invasion, and surrounding  opacities in the left upper lobe which are favored to reflect some early lymphangitic spread of disease (but may alternatively simply reflect postobstructive changes or perilesional hemorrhage). No mediastinal or hilar lymphadenopathy noted at this time. Further evaluation with biopsy and/or PET-CT is recommended in the near future for diagnostic and staging purposes. 2. In addition, there is a smoothly marginated pleural-based lesion in the apex of the right hemithorax which is slightly larger than prior study 09/19/2013 and is low-attenuation, suggesting a cystic lesion. This is favored to be benign (potentially a neurenteric cyst or benign peripheral nerve sheath tumor), but attention at the time of follow-up PET-CT is suggested. 3. Mild cardiomegaly. 4. Additional incidental findings, as above. Electronically Signed   By: Vinnie Langton M.D.   On: 02/17/2016 18:54   Ct Biopsy  02/19/2016  INDICATION: 65 year old female with a history of a left chest wall mass EXAM: CT BIOPSY MEDICATIONS: None. ANESTHESIA/SEDATION: Moderate (conscious) sedation was employed during this procedure. A total of Versed 1.5 mg and Fentanyl 75 mcg was administered intravenously. Moderate Sedation Time: 11 minutes. The patient's level of consciousness and vital signs were monitored continuously by radiology nursing throughout the procedure under my direct supervision. FLUOROSCOPY TIME:  CT COMPLICATIONS: None PROCEDURE: The procedure, risks, benefits, and alternatives were explained to the patient and the patient's family. Specific risks that were addressed included bleeding, infection, pneumothorax, need for further procedure including chest tube placement, chance of delayed pneumothorax or hemorrhage, hemoptysis, nondiagnostic sample, cardiopulmonary collapse, death. Questions regarding the procedure were encouraged and answered. The patient understands and consents to the procedure. Patient was positioned in the supine position  on the CT gantry table and a scout CT of the chest was performed for planning purposes. Once angle of approach was determined, the skin and subcutaneous tissues this scan was prepped and draped in the usual sterile fashion, and a sterile drape was applied covering the operative field. A sterile gown and sterile gloves were used for the procedure. Local anesthesia was provided with 1% Lidocaine. The skin and subcutaneous tissues were infiltrated 1% lidocaine for local anesthesia, and a small stab incision was made with an 11 blade scalpel. Using CT guidance, a 17 gauge trocar needle was advanced into the left chest wall mass. After confirmation of the tip, separate 18 gauge core biopsies were performed. These were placed into solution for transportation to the lab. Patient tolerated the procedure well and remained hemodynamically stable throughout. No complications were encountered and no significant blood loss was encountered. IMPRESSION: Status post CT-guided biopsy of left chest wall mass. Tissue specimen sent to pathology for complete histopathologic analysis. Signed, Dulcy Fanny. Earleen Newport, DO [facility] Electronically Signed   By: Corrie Mckusick D.O.   On: 02/19/2016 17:48   Dg Shoulder Left  02/17/2016  CLINICAL DATA:  Chronic worsening left posterior shoulder pain, radiating to the left lower arm and left lateral chest wall. Initial encounter.  EXAM: LEFT SHOULDER - 2+ VIEW COMPARISON:  None. FINDINGS: There is no evidence of fracture or dislocation. The left humeral head is seated within the glenoid fossa. The acromioclavicular joint is unremarkable in appearance. No significant soft tissue abnormalities are seen. There is a 6.6 cm masslike density at the left lung apex. This is concerning for malignancy, though pneumonia might have a similar appearance. IMPRESSION: 1. No evidence of fracture or dislocation. 2. 6.6 cm masslike density at the left lung  apex. This is concerning for malignancy, though pneumonia might have a similar appearance. Contrast-enhanced CT of the chest is recommended for further evaluation. These results were called by telephone at the time of interpretation on 02/17/2016 at 12:51 am to Dr. Varney Biles, who verbally acknowledged these results. Electronically Signed   By: Garald Balding M.D.   On: 02/17/2016 00:51

## 2016-03-10 NOTE — Assessment & Plan Note (Signed)
Surveillance colonoscopy due in 3 years.

## 2016-03-10 NOTE — Assessment & Plan Note (Signed)
Currently being managed by Dr. Whitney Muse. ?etiology. Recent diagnosis lung cancer. PET pending. Nothing on recent EGD and colonoscopy to explain. Discussed with patient, no need for further GI workup at this time unless she develops unexplained worsening anemia and at that point could consider small bowel capsule endoscopy. Management of malignancy takes priority.

## 2016-03-10 NOTE — Patient Instructions (Signed)
1. You will need another colonoscopy in 3 years given history of numerous colon polyps on recent colonoscopy. 2. Continue pantoprazole daily for acid reflux. 3. Continue to follow with Dr. Whitney Muse regarding anemia. 4. At this time no further workup necessary from a GI standpoint however if you have unexplained progressive anemia i.e. iron deficiency anemia, you may require small bowel capsule endoscopy. 5. Please call if you have any further questions or concerns. 6. Office visit as needed

## 2016-03-12 ENCOUNTER — Encounter (HOSPITAL_COMMUNITY)
Admission: RE | Admit: 2016-03-12 | Discharge: 2016-03-12 | Disposition: A | Discharge status: Home / Self Care | Payer: Medicaid Other | Source: Ambulatory Visit | Attending: Hematology & Oncology | Admitting: Hematology & Oncology

## 2016-03-12 ENCOUNTER — Ambulatory Visit (HOSPITAL_COMMUNITY): Payer: PRIVATE HEALTH INSURANCE

## 2016-03-12 DIAGNOSIS — E538 Deficiency of other specified B group vitamins: Secondary | ICD-10-CM | POA: Insufficient documentation

## 2016-03-12 DIAGNOSIS — R918 Other nonspecific abnormal finding of lung field: Secondary | ICD-10-CM | POA: Diagnosis not present

## 2016-03-12 DIAGNOSIS — D649 Anemia, unspecified: Secondary | ICD-10-CM | POA: Insufficient documentation

## 2016-03-12 LAB — GLUCOSE, CAPILLARY: GLUCOSE-CAPILLARY: 129 mg/dL — AB (ref 65–99)

## 2016-03-12 MED ORDER — FLUDEOXYGLUCOSE F - 18 (FDG) INJECTION
8.4300 | Freq: Once | INTRAVENOUS | Status: AC | PRN
  Administered 2016-03-12: 8.43 via INTRAVENOUS

## 2016-03-13 ENCOUNTER — Encounter (HOSPITAL_BASED_OUTPATIENT_CLINIC_OR_DEPARTMENT_OTHER): Payer: Medicaid Other | Admitting: Oncology

## 2016-03-13 ENCOUNTER — Encounter (HOSPITAL_BASED_OUTPATIENT_CLINIC_OR_DEPARTMENT_OTHER): Payer: Medicaid Other

## 2016-03-13 ENCOUNTER — Encounter (HOSPITAL_COMMUNITY): Payer: Self-pay | Admitting: Oncology

## 2016-03-13 VITALS — BP 123/64 | HR 100 | Temp 98.3°F | Resp 16 | Wt 175.6 lb

## 2016-03-13 DIAGNOSIS — E538 Deficiency of other specified B group vitamins: Secondary | ICD-10-CM

## 2016-03-13 DIAGNOSIS — C3412 Malignant neoplasm of upper lobe, left bronchus or lung: Secondary | ICD-10-CM

## 2016-03-13 MED ORDER — CYANOCOBALAMIN 1000 MCG/ML IJ SOLN
1000.0000 ug | Freq: Once | INTRAMUSCULAR | Status: AC
  Administered 2016-03-13: 1000 ug via INTRAMUSCULAR

## 2016-03-13 MED ORDER — CYANOCOBALAMIN 1000 MCG/ML IJ SOLN
INTRAMUSCULAR | Status: AC
  Filled 2016-03-13: qty 1

## 2016-03-13 NOTE — Progress Notes (Signed)
Susan Mahoney presents today for injection per the provider's orders.  B12 administration without incident; see MAR for injection details.  Patient tolerated procedure well and without incident.  No questions or complaints noted at this time.

## 2016-03-13 NOTE — Assessment & Plan Note (Signed)
Low B12 level.  Oncology Flowsheet 02/28/2016 03/04/2016 03/13/2016  cyanocobalamin ((VITAMIN B-12)) IM 1,000 mcg 1,000 mcg 1,000 mcg   She will be due for her next injection next week.

## 2016-03-13 NOTE — Assessment & Plan Note (Signed)
Left pancoast tumor, measuring 9.6 cm in largest dimension with PET scan on 03/12/2016 demonstrating direct chest wall invasion and findings suspicious for lymphangitic spread and biopsy proven to be bronchogenic carcinoma, but unable to identify phenotype but poorly differentiated.  Oncology history updated.  I personally reviewed and went over radiographic studies with the patient.  The results are noted within this dictation.  PET scan is reviewed in detail with the patient and her husband.  I also reviewed the images on the computer screen with the patient.    I have reviewed the patient's case with Dr. Tammi Klippel (Rad Onc).  He agrees that radiation oncology consultation would be beneficial as concomitant chemoradiation will likely be the course of treatment.  On detailed review, Dr. Tammi Klippel noted an small calcified area in breast.  The patient reports that her last mammogram was negative and performed in April 2017 at [facility].  We will get a copy of this report and ask radiology to review the patient PET scan with mammogram information.  We may need to consider repeating mammogram based upon recent imaging.  The patient and her husband are interested in traveling to [facility] for radiation consultation and therapy.  I will refer her to Dr. Tammi Klippel for consideration of XRT in [place].  I have reviewed port-a-cath and its role in the patient's care.  I have reviewed google images of a port and explained the importance of this device in the administration of chemotherapy.  Will refer the patient to [facility] for port placement.  She will return in ~ 2 weeks for follow-up.

## 2016-03-13 NOTE — Patient Instructions (Signed)
[place] at [facility] Discharge Instructions  RECOMMENDATIONS MADE BY THE CONSULTANT AND ANY TEST RESULTS WILL BE SENT TO YOUR REFERRING PHYSICIAN.  You were seen by Kirby Crigler, PA today.  He is going to refer you to [facility] to see Dr. Tammi Klippel.  You will have a port-a-cath placed at Interventional Radiology in [place].  Return to clinic in 2 weeks for follow-up appointment.   Call clinic with any questions or concerns.   Thank you for choosing [place] at [facility] to provide your oncology and hematology care.  To afford each patient quality time with our provider, please arrive at least 15 minutes before your scheduled appointment time.   Beginning January 23rd 2017 lab work for the [facility] will be done in the  Main lab at [facility] on 1st floor. If you have a lab appointment with the [facility] please come in thru the  Main Entrance and check in at the main information desk  You need to re-schedule your appointment should you arrive 10 or more minutes late.  We strive to give you quality time with our providers, and arriving late affects you and other patients whose appointments are after yours.  Also, if you no show three or more times for appointments you may be dismissed from the clinic at the providers discretion.     Again, thank you for choosing [facility].  Our hope is that these requests will decrease the amount of time that you wait before being seen by our physicians.       _____________________________________________________________  Should you have questions after your visit to [facility], please contact our office at [phone] between the hours of 8:30 a.m. and 4:30 p.m.  Voicemails left after 4:30 p.m. will not be returned until the following business day.  For prescription refill requests, have your pharmacy contact our office.         Resources For  Cancer Patients and their Caregivers ? American Cancer Society: Can assist with transportation, wigs, general needs, runs Look Good Feel Better.        (586)041-7400 ? Cancer Care: Provides financial assistance, online support groups, medication/co-pay assistance.  1-800-813-HOPE 440 091 7901) ? [facility] Assists [place] cancer patients and their families through emotional , educational and financial support.  718-198-8533 ? [place] DSS Where to apply for food stamps, Medicaid and utility assistance. 438-645-2088 ? [facility]: Transportation to medical appointments. 540-707-0412 ? Social Security Administration: May apply for disability if have a Stage IV cancer. 276-786-5681 (570)466-4254 ? [employer], Disability and Transit Services: Assists with nutrition, care and transit needs. [place] Support Programs: '@10RELATIVEDAYS'$ @ > Cancer Support Group  2nd Tuesday of the month 1pm-2pm, Journey Room  > Creative Journey  3rd Tuesday of the month 1130am-1pm, Journey Room  > Look Good Feel Better  1st Wednesday of the month 10am-12 noon, Journey Room (Call [facility] to register [phone])

## 2016-03-13 NOTE — Progress Notes (Signed)
[name], PA-C [address] [place]  [address]  Pancoast tumor of left lung ([place]) - Plan: IR Fluoro Guide CV Line Left  Low vitamin B12 level  CURRENT THERAPY: Treatment planning  INTERVAL HISTORY: Susan Mahoney 65 y.o. female returns for followup of Left pancoast tumor, measuring 9.6 cm in largest dimension with PET scan on 03/12/2016 demonstrating direct chest wall invasion and findings suspicious for lymphangitic spread and biopsy proven to be bronchogenic carcinoma, but unable to identify phenotype but poorly differentiated.    Pancoast tumor of left lung ([place])   02/16/2016 - 02/20/2016 Hospital Admission Symptomatic anemia, lung mass   02/17/2016 Imaging CT chest with 9.6 x 6.1 x 6.6 cm macrolobulated mass in LUL with evidence of direct chest wall invasion, surrounding opacities ? early lymphangitic spread. No mediastinal or hilar LAD. pleural based lesion R apex prob cystic present 09/19/2013   02/18/2016 Procedure Colonoscopy with diverticulosis, five polyps   02/19/2016 Initial Biopsy Ct biopsy in IR   02/19/2016 Pathology Results poorly diff malignancy, unclear phenotype. carcinoma and melanoma are ruled out, calretinin positive staining may be seen in mesotheliomas in addition to other tumors   02/19/2016 Procedure EGD medium sized hiatal hernia, non obstructing schatzki ring, esophagitis   03/12/2016 PET scan Hypermetabolic LUL lung mass with direct chest wall invasion. Lower level hypermetabolism within the surrounding ground-glass opacity and septal thickening, most consistent with lympangitic spread. No thoracic adenopathy.     The patient is here today to review her PET scan results.  We reviewed them in detail and also reviewed the images themselves on the computer screen.  She denies any active oncology complaints today.   Her husband was an active participator in today's discussion.  Apprarently, he has a history of Hodgkin's Lymphoma at the age of  1 treated at [facility].  He blames the cause of his malignancy on "food" he ate and the inability to digest it.  The patient and her husband understand the PET scan results.  They both understand the plan moving forward as well.  She notes fatigue, but denies any cough, SOB, chest pain, and hemoptysis.  Review of Systems  Constitutional: Positive for malaise/fatigue. Negative for fever and chills.  HENT: Negative.   Eyes: Negative.   Respiratory: Negative.  Negative for cough, hemoptysis and shortness of breath.   Cardiovascular: Negative.  Negative for chest pain.  Gastrointestinal: Negative.   Genitourinary: Negative.   Musculoskeletal: Negative.   Skin: Negative.   Neurological: Negative.   Endo/Heme/Allergies: Negative.   Psychiatric/Behavioral: Negative.     Past Medical History  Diagnosis Date  . Hypertension   . Sinus infection   . Diabetes mellitus, type 2 ([place])   . Obesity   . Anemia   . Tobacco abuse   . Pancoast tumor of left lung ([place]) 03/03/2016  . Low vitamin B12 level 02/28/2016    Past Surgical History  Procedure Laterality Date  . Abdominal hysterectomy    . Esophagogastroduodenoscopy N/A 02/18/2016    Procedure: ESOPHAGOGASTRODUODENOSCOPY (EGD);  Surgeon: Daneil Dolin, MD;  [facility];  Service: Endoscopy;  Laterality: N/A;  . Colonoscopy N/A 02/18/2016    Procedure: COLONOSCOPY;  Surgeon: Daneil Dolin, MD;  Location[facility];  Service: Endoscopy;  Laterality: N/A;    Family History  Problem Relation Age of Onset  . Cancer Father     Social History   Social History  . Marital Status:  Married    Spouse Name: N/A  . Number of Children: N/A  . Years of Education: N/A   Social History Main Topics  . Smoking status: Current Every Day Smoker -- 0.50 packs/day for 30 years    Types: Cigarettes  . Smokeless tobacco: Never Used  . Alcohol Use: No  . Drug Use: No  . Sexual Activity: Yes    Birth Control/ Protection: Surgical   Other  Topics Concern  . None   Social History Narrative     PHYSICAL EXAMINATION  ECOG PERFORMANCE STATUS: 1 - Symptomatic but completely ambulatory  Filed Vitals:   03/13/16 1112  BP: 123/64  Pulse: 100  Temp: 98.3 F (36.8 C)  Resp: 16    GENERAL:alert, no distress, well nourished, well developed, obese, anxious and accompanied by her husband. SKIN: skin color, texture, turgor are normal, no rashes or significant lesions HEAD: Normocephalic, No masses, lesions, tenderness or abnormalities EYES: normal, EOMI, Conjunctiva are pink and non-injected EARS: External ears normal OROPHARYNX:mucous membranes are moist  NECK: supple, trachea midline LYMPH:  no palpable lymphadenopathy BREAST:not examined LUNGS: clear to auscultation  HEART: regular rate & rhythm ABDOMEN:abdomen soft, non-tender and normal bowel sounds BACK: Back symmetric, no curvature. EXTREMITIES:less then 2 second capillary refill, no joint deformities, effusion, or inflammation, no skin discoloration, no cyanosis  NEURO: alert & oriented x 3 with fluent speech, no focal motor/sensory deficits   LABORATORY DATA: CBC    Component Value Date/Time   WBC 9.6 03/04/2016 1507   RBC 5.13* 03/04/2016 1507   RBC 3.62* 02/17/2016 0752   HGB 11.1* 03/04/2016 1507   HCT 35.9* 03/04/2016 1507   PLT 366 03/04/2016 1507   MCV 70.0* 03/04/2016 1507   MCH 21.6* 03/04/2016 1507   MCHC 30.9 03/04/2016 1507   RDW 24.4* 03/04/2016 1507   LYMPHSABS 2.6 03/04/2016 1507   MONOABS 0.4 03/04/2016 1507   EOSABS 0.2 03/04/2016 1507   BASOSABS 0.0 03/04/2016 1507      Chemistry      Component Value Date/Time   NA 141 02/19/2016 0510   K 4.1 02/19/2016 0510   CL 111 02/19/2016 0510   CO2 25 02/19/2016 0510   BUN 29* 02/19/2016 0510   CREATININE 1.35* 02/19/2016 0510      Component Value Date/Time   CALCIUM 8.5* 02/19/2016 0510   ALKPHOS 105 09/03/2013 0443   AST 21 09/03/2013 0443   ALT 27 09/03/2013 0443   BILITOT 0.2*  09/03/2013 0443        PENDING LABS:   RADIOGRAPHIC STUDIES:  Dg Chest 1 View  02/19/2016  CLINICAL DATA:  Status post left lung biopsy. EXAM: CHEST 1 VIEW COMPARISON:  None. FINDINGS: The heart size and mediastinal contours are within normal limits. No pneumothorax is noted. Right peritracheal mass is noted consistent with adenopathy. Large left upper lobe mass is again noted. No pleural effusion is noted. The visualized skeletal structures are unremarkable. IMPRESSION: No pneumothorax status post biopsy of left upper lobe lung mass. Electronically Signed   By: Marijo Conception, M.D.   On: 02/19/2016 15:31   Ct Chest W Contrast  02/17/2016  CLINICAL DATA:  65 year old female with possible lung mass noted on recent left shoulder radiographs. EXAM: CT CHEST WITH CONTRAST TECHNIQUE: Multidetector CT imaging of the chest was performed during intravenous contrast administration. CONTRAST:  73m ISOVUE-300 IOPAMIDOL (ISOVUE-300) INJECTION 61% COMPARISON:  Chest CT 09/19/2013. Left shoulder radiograph 02/17/2016. FINDINGS: Mediastinum/Lymph Nodes: Heart size is mildly enlarged. There is  no significant pericardial fluid, thickening or pericardial calcification. No pathologically enlarged mediastinal or hilar lymph nodes. Esophagus is unremarkable in appearance. No axillary lymphadenopathy. Multiple heterogeneous appearing thyroid nodules, largest of which measures 2.0 x 1.1 cm in the left lobe of the thyroid gland. Lungs/Pleura: In the left upper lobe there is a macrolobulated mass measuring 9.6 x 6.1 x 6.6 cm (axial image 32 of series 2 and coronal image 81 of series 4). This lesion makes a broad with the overlying pleura and clearly demonstrates direct chest wall invasion in the first and second intercostal spaces. There is a tiny focus of cavitation in the superior aspect of the lesion (image 22 of series 2). Adjacent to the lesion in the medial aspect of the left upper lobe there is extensive ground-glass  attenuation and septal thickening, which could reflect some lymphangitic spread of disease, postobstructive changes secondary to extrinsic mass effect upon left upper lobe bronchi, or may simply reflect perilesional hemorrhage. In the apex of the right hemithorax there is a pleural-based lesion which is centrally low-attenuation (18 HU), which is larger than prior study 09/19/2013, currently measuring 3.7 x 2.6 x 2.7 cm (previously 2.2 x 1.6 x 1.1 cm). This lesion remains smoothly marginated. No pleural effusions. Upper Abdomen: Low-attenuation lesions right kidney compatible with simple cysts, measuring up to 1.4 cm in the upper pole. Musculoskeletal/Soft Tissues: There are no aggressive appearing lytic or blastic lesions noted in the visualized portions of the skeleton. IMPRESSION: 1. 9.6 x 6.1 x 6.6 cm macrolobulated mass in the left upper lobe with evidence of direct chest wall invasion, and surrounding opacities in the left upper lobe which are favored to reflect some early lymphangitic spread of disease (but may alternatively simply reflect postobstructive changes or perilesional hemorrhage). No mediastinal or hilar lymphadenopathy noted at this time. Further evaluation with biopsy and/or PET-CT is recommended in the near future for diagnostic and staging purposes. 2. In addition, there is a smoothly marginated pleural-based lesion in the apex of the right hemithorax which is slightly larger than prior study 09/19/2013 and is low-attenuation, suggesting a cystic lesion. This is favored to be benign (potentially a neurenteric cyst or benign peripheral nerve sheath tumor), but attention at the time of follow-up PET-CT is suggested. 3. Mild cardiomegaly. 4. Additional incidental findings, as above. Electronically Signed   By: Vinnie Langton M.D.   On: 02/17/2016 18:54   Nm Pet Image Initial (pi) Skull Base To Thigh  03/12/2016  CLINICAL DATA:  Initial treatment strategy for left upper lobe lung mass with  chest wall invasion. Right apical low-density cystic lesion. EXAM: NUCLEAR MEDICINE PET SKULL BASE TO THIGH TECHNIQUE: 8.4 mCi F-18 FDG was injected intravenously. Full-ring PET imaging was performed from the skull base to thigh after the radiotracer. CT data was obtained and used for attenuation correction and anatomic localization. FASTING BLOOD GLUCOSE:  Value: 129 mg/dl COMPARISON:  Chest CT of 02/17/2016. FINDINGS: Mild degradation secondary to patient body habitus. NECK No areas of abnormal hypermetabolism. CHEST The well-circumscribed low-density right medial apical lesion corresponds to mild hypermetabolism. This measures 3.2 x 2.5 cm and a S.U.V. max of 5.8 on image 46/series 4. A dominant left upper lobe lung mass with chest wall invasion corresponds to marked hypermetabolism. This measures a S.U.V. max of 47.6 SUV. The surrounding ground-glass opacity and septal thickening demonstrates more low-level hypermetabolism, favoring lymphangitic tumor spread. No thoracic nodal hypermetabolism. ABDOMEN/PELVIS No abnormal soft tissue hypermetabolism. SKELETON mildly heterogeneous marrow hypermetabolism throughout. This is favored  to be artifactual and related to patient size. The most focal area is in the left acetabulum and measures a S.U.V. max of 4.6. This is without CT correlate. CT IMAGES PERFORMED FOR ATTENUATION CORRECTION Probable sebaceous cyst about the posterior right scalp on image 8/ series 4, 1.5 cm. No cervical adenopathy. Bilateral carotid atherosclerosis. Chest findings deferred to recent diagnostic chest CT. Aortic and branch vessel atherosclerosis. Normal adrenal glands. Interpolar left renal cyst. Prominent caudate lobe with hepatomegaly. Abdominal aortic atherosclerosis. Hysterectomy. IMPRESSION: 1. Dominant hypermetabolic left upper lobe lung mass with direct chest wall invasion , consistent with primary bronchogenic carcinoma. Lower level hypermetabolism within the surrounding ground-glass  opacity and septal thickening, most consistent with lymphangitic tumor spread. 2. No thoracic nodal hypermetabolism identified. 3. The right apical well-circumscribed low-density lesion demonstrates hypermetabolism and is only minimally enlarged compared to 2014. This remains suspicious for a nerve sheath tumor versus neuroenteric cyst. 4. Mild limitations secondary to patient body habitus. Heterogeneous marrow density which is favored to be artifactual and secondary. No convincing evidence of osseous metastasis. 5.  Aortic atherosclerosis. Electronically Signed   By: Abigail Miyamoto M.D.   On: 03/12/2016 12:31   Ct Biopsy  02/19/2016  INDICATION: 65 year old female with a history of a left chest wall mass EXAM: CT BIOPSY MEDICATIONS: None. ANESTHESIA/SEDATION: Moderate (conscious) sedation was employed during this procedure. A total of Versed 1.5 mg and Fentanyl 75 mcg was administered intravenously. Moderate Sedation Time: 11 minutes. The patient's level of consciousness and vital signs were monitored continuously by radiology nursing throughout the procedure under my direct supervision. FLUOROSCOPY TIME:  CT COMPLICATIONS: None PROCEDURE: The procedure, risks, benefits, and alternatives were explained to the patient and the patient's family. Specific risks that were addressed included bleeding, infection, pneumothorax, need for further procedure including chest tube placement, chance of delayed pneumothorax or hemorrhage, hemoptysis, nondiagnostic sample, cardiopulmonary collapse, death. Questions regarding the procedure were encouraged and answered. The patient understands and consents to the procedure. Patient was positioned in the supine position on the CT gantry table and a scout CT of the chest was performed for planning purposes. Once angle of approach was determined, the skin and subcutaneous tissues this scan was prepped and draped in the usual sterile fashion, and a sterile drape was applied covering the  operative field. A sterile gown and sterile gloves were used for the procedure. Local anesthesia was provided with 1% Lidocaine. The skin and subcutaneous tissues were infiltrated 1% lidocaine for local anesthesia, and a small stab incision was made with an 11 blade scalpel. Using CT guidance, a 17 gauge trocar needle was advanced into the left chest wall mass. After confirmation of the tip, separate 18 gauge core biopsies were performed. These were placed into solution for transportation to the lab. Patient tolerated the procedure well and remained hemodynamically stable throughout. No complications were encountered and no significant blood loss was encountered. IMPRESSION: Status post CT-guided biopsy of left chest wall mass. Tissue specimen sent to pathology for complete histopathologic analysis. Signed, Dulcy Fanny. Earleen Newport, DO [facility] Electronically Signed   By: Corrie Mckusick D.O.   On: 02/19/2016 17:48   Dg Shoulder Left  02/17/2016  CLINICAL DATA:  Chronic worsening left posterior shoulder pain, radiating to the left lower arm and left lateral chest wall. Initial encounter. EXAM: LEFT SHOULDER - 2+ VIEW COMPARISON:  None. FINDINGS: There is no evidence of fracture or dislocation. The left humeral head is seated within the glenoid fossa.  The acromioclavicular joint is unremarkable in appearance. No significant soft tissue abnormalities are seen. There is a 6.6 cm masslike density at the left lung apex. This is concerning for malignancy, though pneumonia might have a similar appearance. IMPRESSION: 1. No evidence of fracture or dislocation. 2. 6.6 cm masslike density at the left lung apex. This is concerning for malignancy, though pneumonia might have a similar appearance. Contrast-enhanced CT of the chest is recommended for further evaluation. These results were called by telephone at the time of interpretation on 02/17/2016 at 12:51 am to Dr. Varney Biles, who verbally acknowledged these results. Electronically Signed   By: Garald Balding M.D.   On: 02/17/2016 00:51     PATHOLOGY:    ASSESSMENT AND PLAN:  Pancoast tumor of left lung ([facility]) Left pancoast tumor, measuring 9.6 cm in largest dimension with PET scan on 03/12/2016 demonstrating direct chest wall invasion and findings suspicious for lymphangitic spread and biopsy proven to be bronchogenic carcinoma, but unable to identify phenotype but poorly differentiated.  Oncology history updated.  I personally reviewed and went over radiographic studies with the patient.  The results are noted within this dictation.  PET scan is reviewed in detail with the patient and her husband.  I also reviewed the images on the computer screen with the patient.    I have reviewed the patient's case with Dr. Tammi Klippel (Rad Onc).  He agrees that radiation oncology consultation would be beneficial as concomitant chemoradiation will likely be the course of treatment.  On detailed review, Dr. Tammi Klippel noted an small calcified area in breast.  The patient reports that her last mammogram was negative and performed in April 2017 at [facility].  We will get a copy of this report and ask radiology to review the patient PET scan with mammogram information.  We may need to consider repeating mammogram based upon recent imaging.  The patient and her husband are interested in traveling to [facility] for radiation consultation and therapy.  I will refer her to Dr. Tammi Klippel for consideration of XRT in [place].  I have reviewed port-a-cath and its role in the patient's care.  I have reviewed google images of a port and explained the importance of this device in the administration of chemotherapy.  Will refer the patient to [facility] for port placement.  She will return in ~ 2 weeks for follow-up.  Low vitamin B12 level Low B12 level.  Oncology Flowsheet 02/28/2016 03/04/2016 03/13/2016  cyanocobalamin ((VITAMIN B-12)) IM  1,000 mcg 1,000 mcg 1,000 mcg   She will be due for her next injection next week.    ORDERS PLACED FOR THIS ENCOUNTER: Orders Placed This Encounter  Procedures  . IR Fluoro Guide CV Line Left    MEDICATIONS PRESCRIBED THIS ENCOUNTER: No orders of the defined types were placed in this encounter.    THERAPY PLAN:  Refer to XRT with plans for concomitant chemoradiation.  Refer to IR for port placement.  Will continue with B12 injections as outlined.  All questions were answered. The patient knows to call the clinic with any problems, questions or concerns. We can certainly see the patient much sooner if necessary.  Patient and plan discussed with Dr. Ancil Linsey and she is in agreement with the aforementioned.   This note is electronically signed by: Doy Mince 03/13/2016 9:26 PM

## 2016-03-18 ENCOUNTER — Encounter (HOSPITAL_BASED_OUTPATIENT_CLINIC_OR_DEPARTMENT_OTHER): Payer: Medicaid Other

## 2016-03-18 ENCOUNTER — Other Ambulatory Visit: Payer: Self-pay | Admitting: Radiology

## 2016-03-18 VITALS — BP 116/61 | HR 87 | Temp 98.0°F | Resp 16

## 2016-03-18 DIAGNOSIS — E538 Deficiency of other specified B group vitamins: Secondary | ICD-10-CM

## 2016-03-18 DIAGNOSIS — R918 Other nonspecific abnormal finding of lung field: Secondary | ICD-10-CM

## 2016-03-18 DIAGNOSIS — D649 Anemia, unspecified: Secondary | ICD-10-CM

## 2016-03-18 DIAGNOSIS — C3412 Malignant neoplasm of upper lobe, left bronchus or lung: Secondary | ICD-10-CM

## 2016-03-18 MED ORDER — HYDROCODONE-ACETAMINOPHEN 5-325 MG PO TABS: 1.0000 | ORAL_TABLET | ORAL | Status: DC | PRN

## 2016-03-18 MED ORDER — CYANOCOBALAMIN 1000 MCG/ML IJ SOLN
1000.0000 ug | Freq: Once | INTRAMUSCULAR | Status: AC
  Administered 2016-03-18: 1000 ug via INTRAMUSCULAR

## 2016-03-19 ENCOUNTER — Other Ambulatory Visit: Payer: Self-pay | Admitting: Radiology

## 2016-03-20 ENCOUNTER — Ambulatory Visit (HOSPITAL_COMMUNITY)
Admission: RE | Admit: 2016-03-20 | Discharge: 2016-03-20 | Disposition: A | Discharge status: Home / Self Care | Payer: Medicaid Other | Source: Ambulatory Visit | Attending: Oncology | Admitting: Oncology

## 2016-03-20 ENCOUNTER — Ambulatory Visit (HOSPITAL_COMMUNITY)
Admission: RE | Admit: 2016-03-20 | Discharge: 2016-03-20 | Disposition: A | Discharge status: Home / Self Care | Payer: Medicaid Other | Source: Ambulatory Visit | Attending: Hematology & Oncology | Admitting: Hematology & Oncology

## 2016-03-20 ENCOUNTER — Encounter (HOSPITAL_COMMUNITY): Payer: Self-pay

## 2016-03-20 ENCOUNTER — Encounter: Payer: Self-pay | Admitting: Radiation Oncology

## 2016-03-20 ENCOUNTER — Ambulatory Visit
Admission: RE | Admit: 2016-03-20 | Discharge: 2016-03-20 | Disposition: A | Discharge status: Home / Self Care | Payer: Medicaid Other | Source: Ambulatory Visit | Attending: Radiation Oncology | Admitting: Radiation Oncology

## 2016-03-20 ENCOUNTER — Other Ambulatory Visit (HOSPITAL_COMMUNITY): Payer: Self-pay | Admitting: Oncology

## 2016-03-20 VITALS — BP 110/82 | HR 98 | Resp 18 | Ht <= 58 in | Wt 174.4 lb

## 2016-03-20 DIAGNOSIS — C3412 Malignant neoplasm of upper lobe, left bronchus or lung: Secondary | ICD-10-CM

## 2016-03-20 DIAGNOSIS — E119 Type 2 diabetes mellitus without complications: Secondary | ICD-10-CM | POA: Insufficient documentation

## 2016-03-20 DIAGNOSIS — F17210 Nicotine dependence, cigarettes, uncomplicated: Secondary | ICD-10-CM | POA: Insufficient documentation

## 2016-03-20 DIAGNOSIS — E669 Obesity, unspecified: Secondary | ICD-10-CM | POA: Diagnosis not present

## 2016-03-20 DIAGNOSIS — I10 Essential (primary) hypertension: Secondary | ICD-10-CM | POA: Insufficient documentation

## 2016-03-20 DIAGNOSIS — C3410 Malignant neoplasm of upper lobe, unspecified bronchus or lung: Secondary | ICD-10-CM | POA: Diagnosis not present

## 2016-03-20 DIAGNOSIS — E538 Deficiency of other specified B group vitamins: Secondary | ICD-10-CM | POA: Diagnosis not present

## 2016-03-20 DIAGNOSIS — Z510 Encounter for antineoplastic radiation therapy: Secondary | ICD-10-CM | POA: Insufficient documentation

## 2016-03-20 DIAGNOSIS — D649 Anemia, unspecified: Secondary | ICD-10-CM | POA: Insufficient documentation

## 2016-03-20 LAB — BASIC METABOLIC PANEL
ANION GAP: 11 (ref 5–15)
BUN: 49 mg/dL — ABNORMAL HIGH (ref 6–20)
CO2: 24 mmol/L (ref 22–32)
Calcium: 10.1 mg/dL (ref 8.9–10.3)
Chloride: 103 mmol/L (ref 101–111)
Creatinine, Ser: 1.79 mg/dL — ABNORMAL HIGH (ref 0.44–1.00)
GFR, EST AFRICAN AMERICAN: 34 mL/min — AB (ref >60)
GFR, EST NON AFRICAN AMERICAN: 29 mL/min — AB (ref >60)
GLUCOSE: 114 mg/dL — AB (ref 65–99)
POTASSIUM: 4.1 mmol/L (ref 3.5–5.1)
SODIUM: 138 mmol/L (ref 135–145)

## 2016-03-20 LAB — CBC WITH DIFFERENTIAL/PLATELET
Basophils Absolute: 0 10*3/uL (ref 0.0–0.1)
Basophils Relative: 0 %
EOS ABS: 0.4 10*3/uL (ref 0.0–0.7)
Eosinophils Relative: 3 %
HEMATOCRIT: 30.5 % — AB (ref 36.0–46.0)
Hemoglobin: 9.7 g/dL — ABNORMAL LOW (ref 12.0–15.0)
LYMPHS PCT: 19 %
Lymphs Abs: 2.2 10*3/uL (ref 0.7–4.0)
MCH: 21.1 pg — AB (ref 26.0–34.0)
MCHC: 31.8 g/dL (ref 30.0–36.0)
MCV: 66.4 fL — AB (ref 78.0–100.0)
MONO ABS: 0.7 10*3/uL (ref 0.1–1.0)
MONOS PCT: 6 %
NEUTROS PCT: 72 %
Neutro Abs: 8.4 10*3/uL — ABNORMAL HIGH (ref 1.7–7.7)
PLATELETS: 314 10*3/uL (ref 150–400)
RBC: 4.59 MIL/uL (ref 3.87–5.11)
RDW: 23.3 % — AB (ref 11.5–15.5)
WBC: 11.7 10*3/uL — AB (ref 4.0–10.5)

## 2016-03-20 LAB — GLUCOSE, CAPILLARY: GLUCOSE-CAPILLARY: 117 mg/dL — AB (ref 65–99)

## 2016-03-20 LAB — PROTIME-INR
INR: 1.1 (ref 0.00–1.49)
Prothrombin Time: 14 seconds (ref 11.6–15.2)

## 2016-03-20 MED ORDER — FENTANYL CITRATE (PF) 100 MCG/2ML IJ SOLN
INTRAMUSCULAR | Status: AC | PRN
  Administered 2016-03-20: 50 ug via INTRAVENOUS

## 2016-03-20 MED ORDER — MIDAZOLAM HCL 2 MG/2ML IJ SOLN
INTRAMUSCULAR | Status: AC | PRN
  Administered 2016-03-20 (×3): 1 mg via INTRAVENOUS

## 2016-03-20 MED ORDER — SODIUM CHLORIDE 0.9 % IV SOLN
INTRAVENOUS | Status: DC
  Administered 2016-03-20: 10:00:00 via INTRAVENOUS

## 2016-03-20 MED ORDER — CEFAZOLIN SODIUM-DEXTROSE 2-4 GM/100ML-% IV SOLN
2.0000 g | INTRAVENOUS | Status: AC
  Administered 2016-03-20: 2 g via INTRAVENOUS
  Filled 2016-03-20: qty 100

## 2016-03-20 MED ORDER — LIDOCAINE HCL 1 % IJ SOLN
INTRAMUSCULAR | Status: AC
  Filled 2016-03-20: qty 20

## 2016-03-20 MED ORDER — HEPARIN SOD (PORK) LOCK FLUSH 100 UNIT/ML IV SOLN
INTRAVENOUS | Status: AC
  Filled 2016-03-20: qty 5

## 2016-03-20 MED ORDER — HEPARIN SOD (PORK) LOCK FLUSH 100 UNIT/ML IV SOLN
INTRAVENOUS | Status: AC | PRN
  Administered 2016-03-20: 500 [IU] via INTRAVENOUS

## 2016-03-20 MED ORDER — FENTANYL CITRATE (PF) 100 MCG/2ML IJ SOLN
INTRAMUSCULAR | Status: AC
  Filled 2016-03-20: qty 4

## 2016-03-20 MED ORDER — MIDAZOLAM HCL 2 MG/2ML IJ SOLN
INTRAMUSCULAR | Status: AC
  Filled 2016-03-20: qty 6

## 2016-03-20 NOTE — Progress Notes (Signed)
Patient ID: Susan Mahoney, female   DOB: [birth year], 65 y.o.   MRN: [id number]    Referring Physician(s): Robynn Pane S/[place],S  Supervising Physician: Markus Daft  Patient Status:  Outpatient  Chief Complaint:  "I'm here for a port a cath"  Subjective:  Pt familiar to IR service from recent left chest wall mass biopsy on 02/19/16. She has a history of  recently diagnosed poorly differentiated left Pancoast tumor with direct chest wall invasion and finding suspicious for lymphangitic spread. She presents again today for Port-A-Cath placement for planned chemotherapy. She currently denies fever, headache, abdominal pain, back pain, nausea, vomiting or abnormal bleeding. She does have occasional left shoulder /upper chest discomfort as well as occasional cough and some dyspnea with exertion. Additional history as below.  Past Medical History  Diagnosis Date  . Hypertension   . Sinus infection   . Diabetes mellitus, type 2 ([place])   . Obesity   . Anemia   . Tobacco abuse   . Pancoast tumor of left lung ([place]) 03/03/2016  . Low vitamin B12 level 02/28/2016   Past Surgical History  Procedure Laterality Date  . Abdominal hysterectomy    . Esophagogastroduodenoscopy N/A 02/18/2016    Procedure: ESOPHAGOGASTRODUODENOSCOPY (EGD);  Surgeon: Daneil Dolin, MD;  [facility];  Service: Endoscopy;  Laterality: N/A;  . Colonoscopy N/A 02/18/2016    Procedure: COLONOSCOPY;  Surgeon: Daneil Dolin, MD;  [facility];  Service: Endoscopy;  Laterality: N/A;     Allergies: Ace inhibitors and Indomethacin  Medications: Prior to Admission medications   Medication Sig Start Date End Date Taking? Authorizing Provider  acetaminophen (TYLENOL) 500 MG tablet Take 1,000 mg by mouth every 6 (six) hours as needed for mild pain or moderate pain.    Yes Historical Provider, MD  Cetirizine HCl 10 MG CAPS Take 1 capsule (10 mg total) by mouth at bedtime. Restart after you've  completed a three-day course of Benadryl. 09/03/13  Yes Rexene Alberts, MD  Cholecalciferol (VITAMIN D PO) Take 1 tablet by mouth daily. Reported on 03/13/2016   Yes Historical Provider, MD  ferrous gluconate (FERGON) 324 MG tablet Take 1 tablet (324 mg total) by mouth 2 (two) times daily with a meal. 02/20/16  Yes Kathie Dike, MD  glimepiride (AMARYL) 1 MG tablet Take 1 tablet daily if your blood sugar is 200 and over. Further instructions per your primary care provider. 09/03/13  Yes Rexene Alberts, MD  HYDROcodone-acetaminophen (NORCO) 5-325 MG tablet Take 1-2 tablets by mouth every 4 (four) hours as needed for moderate pain. 03/18/16  Yes Manon Hilding Kefalas, PA-C  oxymetazoline (NASAL SPRAY 12 HOUR) 0.05 % nasal spray Place 1 spray into both nostrils daily as needed for congestion. Reported on 02/28/2016   Yes Historical Provider, MD  pantoprazole (PROTONIX) 40 MG tablet Take 1 tablet (40 mg total) by mouth 2 (two) times daily before a meal. 02/20/16  Yes Kathie Dike, MD  triamterene-hydrochlorothiazide (DYAZIDE) 37.5-25 MG per capsule Take 1 each (1 capsule total) by mouth daily. 09/03/13  Yes Rexene Alberts, MD  albuterol (PROVENTIL HFA;VENTOLIN HFA) 108 (90 BASE) MCG/ACT inhaler Inhale 1-2 puffs into the lungs every 6 (six) hours as needed for wheezing or shortness of breath. 09/07/13   Leonard Schwartz, MD     Vital Signs:   Physical Exam patient awake, alert. Chest with distant breath sounds bilaterally. Heart with regular rate and rhythm. Abdomen obese, soft, positive bowel sounds; lower extremities with no edema.  Imaging:  No results found.  Labs:  CBC:  Recent Labs  02/19/16 0510 02/20/16 0536 03/04/16 1507 03/20/16 0950  WBC 9.2 9.4 9.6 11.7*  HGB 8.5* 8.3* 11.1* 9.7*  HCT 28.1* 28.0* 35.9* 30.5*  PLT 319 305 366 314    COAGS:  Recent Labs  02/18/16 1000  INR 1.05  APTT 28    BMP:  Recent Labs  02/17/16 0226 02/18/16 0522 02/19/16 0510 03/20/16 0950  NA 137 139  141 138  K 3.9 4.0 4.1 4.1  CL 105 110 111 103  CO2 '24 24 25 24  '$ GLUCOSE 84 101* 121* 114*  BUN 51* 34* 29* 49*  CALCIUM 9.3 8.8* 8.5* 10.1  CREATININE 1.77* 1.43* 1.35* 1.79*  GFRNONAA 29* 38* 41* 29*  GFRAA 34* 44* 47* 34*    LIVER FUNCTION TESTS: No results for input(s): BILITOT, AST, ALT, ALKPHOS, PROT, ALBUMIN in the last 8760 hours.  Assessment and Plan:  Pt with history of  recently diagnosed poorly differentiated left Pancoast tumor with direct chest wall invasion and finding suspicious for lymphangitic spread. She presents  today for Port-A-Cath placement for planned chemotherapy.Risks and benefits discussed with the patient /family including, but not limited to bleeding, infection, pneumothorax, or fibrin sheath development and need for additional procedures.All of the patient's questions were answered, patient is agreeable to proceed. Consent signed and in chart.     Electronically Signed: D. Rowe Robert 03/20/2016, 10:32 AM   Approximately 20 minutes were spent at the the patient's bedside AND on the patient's hospital floor or unit, greater than 50% of which was counseling/coordinating care for Port-A-Cath placement

## 2016-03-20 NOTE — Procedures (Signed)
Placement of right jugular portacath.  Tip at superior cavoatrial junction.  Minimal blood loss and no immediate complication.

## 2016-03-20 NOTE — Progress Notes (Signed)
See progress note under physician encounter. 

## 2016-03-20 NOTE — Discharge Instructions (Signed)
Implanted Port Home Guide °An implanted port is a type of central line that is placed under the skin. Central lines are used to provide IV access when treatment or nutrition needs to be given through a person's veins. Implanted ports are used for long-term IV access. An implanted port may be placed because:  °· You need IV medicine that would be irritating to the small veins in your hands or arms.   °· You need long-term IV medicines, such as antibiotics.   °· You need IV nutrition for a long period.   °· You need frequent blood draws for lab tests.   °· You need dialysis.   °Implanted ports are usually placed in the chest area, but they can also be placed in the upper arm, the abdomen, or the leg. An implanted port has two main parts:  °· Reservoir. The reservoir is round and will appear as a small, raised area under your skin. The reservoir is the part where a needle is inserted to give medicines or draw blood.   °· Catheter. The catheter is a thin, flexible tube that extends from the reservoir. The catheter is placed into a large vein. Medicine that is inserted into the reservoir goes into the catheter and then into the vein.   °HOW WILL I CARE FOR MY INCISION SITE? °Do not get the incision site wet. Bathe or shower as directed by your health care provider.  °HOW IS MY PORT ACCESSED? °Special steps must be taken to access the port:  °· Before the port is accessed, a numbing cream can be placed on the skin. This helps numb the skin over the port site.   °· Your health care provider uses a sterile technique to access the port. °· Your health care provider must put on a mask and sterile gloves. °· The skin over your port is cleaned carefully with an antiseptic and allowed to dry. °· The port is gently pinched between sterile gloves, and a needle is inserted into the port. °· Only "non-coring" port needles should be used to access the port. Once the port is accessed, a blood return should be checked. This helps  ensure that the port is in the vein and is not clogged.   °· If your port needs to remain accessed for a constant infusion, a clear (transparent) bandage will be placed over the needle site. The bandage and needle will need to be changed every week, or as directed by your health care provider.   °· Keep the bandage covering the needle clean and dry. Do not get it wet. Follow your health care provider's instructions on how to take a shower or bath while the port is accessed.   °· If your port does not need to stay accessed, no bandage is needed over the port.   °WHAT IS FLUSHING? °Flushing helps keep the port from getting clogged. Follow your health care provider's instructions on how and when to flush the port. Ports are usually flushed with saline solution or a medicine called heparin. The need for flushing will depend on how the port is used.  °· If the port is used for intermittent medicines or blood draws, the port will need to be flushed:   °· After medicines have been given.   °· After blood has been drawn.   °· As part of routine maintenance.   °· If a constant infusion is running, the port may not need to be flushed.   °HOW LONG WILL MY PORT STAY IMPLANTED? °The port can stay in for as long as your health care   provider thinks it is needed. When it is time for the port to come out, surgery will be done to remove it. The procedure is similar to the one performed when the port was put in.  WHEN SHOULD I SEEK IMMEDIATE MEDICAL CARE? When you have an implanted port, you should seek immediate medical care if:   You notice a bad smell coming from the incision site.   You have swelling, redness, or drainage at the incision site.   You have more swelling or pain at the port site or the surrounding area.   You have a fever that is not controlled with medicine.   This information is not intended to replace advice given to you by your health care provider. Make sure you discuss any questions you have with  your health care provider.   Document Released: 09/07/2005 Document Revised: 06/28/2013 Document Reviewed: 05/15/2013 Elsevier Interactive Patient Education 2016 [place] Insertion, Care After Refer to this sheet in the next few weeks. These instructions provide you with information on caring for yourself after your procedure. Your health care provider may also give you more specific instructions. Your treatment has been planned according to current medical practices, but problems sometimes occur. Call your health care provider if you have any problems or questions after your procedure. WHAT TO EXPECT AFTER THE PROCEDURE After your procedure, it is typical to have the following:   Discomfort at the port insertion site. Ice packs to the area will help.  Bruising on the skin over the port. This will subside in 3-4 days. HOME CARE INSTRUCTIONS  After your port is placed, you will get a manufacturer's information card. The card has information about your port. Keep this card with you at all times.   Know what kind of port you have. There are many types of ports available.   Wear a medical alert bracelet in case of an emergency. This can help alert health care workers that you have a port.   The port can stay in for as long as your health care provider believes it is necessary.   A home health care nurse may give medicines and take care of the port.   You or a family member can get special training and directions for giving medicine and taking care of the port at home.  SEEK MEDICAL CARE IF:   Your port does not flush or you are unable to get a blood return.   You have a fever or chills. SEEK IMMEDIATE MEDICAL CARE IF:  You have new fluid or pus coming from your incision.   You notice a bad smell coming from your incision site.   You have swelling, pain, or more redness at the incision or port site.   You have chest pain or shortness of breath.   This  information is not intended to replace advice given to you by your health care provider. Make sure you discuss any questions you have with your health care provider.   Document Released: 06/28/2013 Document Revised: 09/12/2013 Document Reviewed: 06/28/2013 Elsevier Interactive Patient Education 2016 [employer] Moderate Conscious Sedation, Adult, Care After Refer to this sheet in the next few weeks. These instructions provide you with information on caring for yourself after your procedure. Your health care provider may also give you more specific instructions. Your treatment has been planned according to current medical practices, but problems sometimes occur. Call your health care provider if you have any problems or questions after your  procedure. WHAT TO EXPECT AFTER THE PROCEDURE  After your procedure:  You may feel sleepy, clumsy, and have poor balance for several hours.  Vomiting may occur if you eat too soon after the procedure. HOME CARE INSTRUCTIONS  Do not participate in any activities where you could become injured for at least 24 hours. Do not:  Drive.  Swim.  Ride a bicycle.  Operate heavy machinery.  Cook.  Use power tools.  Climb ladders.  Work from a high place.  Do not make important decisions or sign legal documents until you are improved.  If you vomit, drink water, juice, or soup when you can drink without vomiting. Make sure you have little or no nausea before eating solid foods.  Only take over-the-counter or prescription medicines for pain, discomfort, or fever as directed by your health care provider.  Make sure you and your family fully understand everything about the medicines given to you, including what side effects may occur.  You should not drink alcohol, take sleeping pills, or take medicines that cause drowsiness for at least 24 hours.  If you smoke, do not smoke without supervision.  If you are feeling better, you may resume normal  activities 24 hours after you were sedated.  Keep all appointments with your health care provider. SEEK MEDICAL CARE IF:  Your skin is pale or bluish in color.  You continue to feel nauseous or vomit.  Your pain is getting worse and is not helped by medicine.  You have bleeding or swelling.  You are still sleepy or feeling clumsy after 24 hours. SEEK IMMEDIATE MEDICAL CARE IF:  You develop a rash.  You have difficulty breathing.  You develop any type of allergic problem.  You have a fever. MAKE SURE YOU:  Understand these instructions.  Will watch your condition.  Will get help right away if you are not doing well or get worse.   This information is not intended to replace advice given to you by your health care provider. Make sure you discuss any questions you have with your health care provider.   Document Released: 06/28/2013 Document Revised: 09/28/2014 Document Reviewed: 06/28/2013 Elsevier Interactive Patient Education [employer].

## 2016-03-20 NOTE — Progress Notes (Signed)
Thoracic Location of Tumor / Histology: Left pancoast tumor, measuring 9.6 cm in largest dimension with PET scan on 03/12/2016 demonstrating direct chest wall invasion and findings suspicious for lymphangitic spread and biopsy proven to be bronchogenic carcinoma, but unable to identify phenotype but poorly differentiated.   Patient present months ago with symptoms of: pain in her left underarm about the third week of May  Biopsies of left apex (if applicable) revealed:    Tobacco/Marijuana/Snuff/ETOH use: stopped smoking one week ago  Past/Anticipated interventions by cardiothoracic surgery, if any: no  Past/Anticipated interventions by medical oncology, if any: yes, referral to xrt with plans for concomitant chemoradiation, referral for port placement and continue B12 injections (received last B12 injection on Wednesday with plan to receive one injection each month)  Signs/Symptoms  Weight changes, if any: yes, 8 lb since May. Reports a poor appetite since diagnosis.  Respiratory complaints, if any: no  Hemoptysis, if any: Reports on rare occasions (maybe twice per week) she will cough up a small amount of bright red blood)  Pain issues, if any: reports left axillary, left upper chest and left scapula pain 3 on a scale of 0-10 but, confirms she just took a pain pill reports pain would be unbearable without norco  SAFETY ISSUES:  Prior radiation? no  Pacemaker/ICD? no   Possible current pregnancy?no  Is the patient on methotrexate? no  Current Complaints / other details:  65 year old female. Married. Reports fatigue.

## 2016-03-21 NOTE — Progress Notes (Signed)
Radiation Oncology         [phone] ________________________________  Initial outpatient Consultation  Name: Susan Mahoney MRN: [id number]  Date: 03/20/2016  DOB: 1951/04/18  NG:EXBMWU, JENNY B, PA-C  [place], Kelby Fam, MD   REFERRING PHYSICIAN: Patrici Ranks, MD  DIAGNOSIS: The primary encounter diagnosis was Pancoast tumor of left lung ([place]). A diagnosis of Malignant neoplasm of upper lobe of left lung (HCC) was also pertinent to this visit.    ICD-9-CM ICD-10-CM   1. Pancoast tumor of left lung (HCC) 162.3 C34.12   2. Malignant neoplasm of upper lobe of left lung (HCC) 162.3 C34.12     HISTORY OF PRESENT ILLNESS: Susan Mahoney is a 65 y.o. female seen at the request of Dr. Whitney Muse for a newly diagnosed Stage IIB lung cancer. The patient reports that she began experiencing chest wall pain in April 2017 which prompted an evaluation in May with her PCP. She ultimately sought care in the ED on 02/17/16 because of progressive chest pain. A shoulder X-ray revealed a mass in the left upper chest. A CT scan  Revealed a 9.6 x 6.1 x 6.6 cm macrolobulated mass in the left upper lobe with evidence of direct chest wall invasion, and surrounding opacities in the left upper lobe which are favored to reflect some early lymphangitic spread of disease. No adenopathy was noted. A right pleural based lesion in the right hemithorax was present suggesting a cystic lesion. A CT biopsy on 02/19/16 revealed poorly differentiated carcinoma of the Left upper lobe lesion and she met with Dr. Whitney Muse. She also was found to be anemic during this work up and has been started on Vitamin B12 injections. A PET scan on  03/12/16 revealed hypermetabolic change in the left sided lung mass, negative in the right chest cyst. A findings onf the left acetabulum was noted but without CT correlate and was not felt to be clinically of concern. No other areas of disease were concerning for metastatic change.  She comes  today to meet with Dr. Tammi Klippel to discuss the role of radiotherapy. She has plans to start chemotherapy in a few weeks and had a PAC placed today.   PREVIOUS RADIATION THERAPY: No  PAST MEDICAL HISTORY:  Past Medical History  Diagnosis Date  . Hypertension   . Sinus infection   . Diabetes mellitus, type 2 ([place])   . Obesity   . Anemia   . Tobacco abuse   . Pancoast tumor of left lung ([place]) 03/03/2016  . Low vitamin B12 level 02/28/2016      PAST SURGICAL HISTORY: Past Surgical History  Procedure Laterality Date  . Abdominal hysterectomy    . Esophagogastroduodenoscopy N/A 02/18/2016    Procedure: ESOPHAGOGASTRODUODENOSCOPY (EGD);  Surgeon: Daneil Dolin, MD;  Location: [facility];  Service: Endoscopy;  Laterality: N/A;  . Colonoscopy N/A 02/18/2016    Procedure: COLONOSCOPY;  Surgeon: Daneil Dolin, MD;  Location: [facility];  Service: Endoscopy;  Laterality: N/A;    FAMILY HISTORY:  Family History  Problem Relation Age of Onset  . Cancer Father     SOCIAL HISTORY:  Social History   Social History  . Marital Status: Married    Spouse Name: N/A  . Number of Children: N/A  . Years of Education: N/A   Occupational History  . Not on file.   Social History Main Topics  . Smoking status: Former Smoker -- 0.50 packs/day for 30 years    Types: Cigarettes  Quit date: 03/13/2016  . Smokeless tobacco: Never Used  . Alcohol Use: No  . Drug Use: No  . Sexual Activity: No   Other Topics Concern  . Not on file   Social History Narrative  The patient is married and accompanied by her daughter. She is from [place]. She quit smoking about a week ago.  ALLERGIES: Ace inhibitors and Indomethacin  MEDICATIONS:  Current Outpatient Prescriptions  Medication Sig Dispense Refill  . acetaminophen (TYLENOL) 500 MG tablet Take 1,000 mg by mouth every 6 (six) hours as needed for mild pain or moderate pain.     Marland Kitchen albuterol (PROVENTIL HFA;VENTOLIN HFA) 108 (90 BASE)  MCG/ACT inhaler Inhale 1-2 puffs into the lungs every 6 (six) hours as needed for wheezing or shortness of breath. 1 Inhaler 0  . Cetirizine HCl 10 MG CAPS Take 1 capsule (10 mg total) by mouth at bedtime. Restart after you've completed a three-day course of Benadryl.    . Cholecalciferol (VITAMIN D PO) Take 1 tablet by mouth daily. Reported on 03/13/2016    . ferrous gluconate (FERGON) 324 MG tablet Take 1 tablet (324 mg total) by mouth 2 (two) times daily with a meal. 60 tablet 3  . glipiZIDE (GLUCOTROL) 10 MG tablet Take 5 mg by mouth daily after supper.    Marland Kitchen HYDROcodone-acetaminophen (NORCO) 5-325 MG tablet Take 1-2 tablets by mouth every 4 (four) hours as needed for moderate pain. 90 tablet 0  . oxymetazoline (NASAL SPRAY 12 HOUR) 0.05 % nasal spray Place 1 spray into both nostrils daily as needed for congestion. Reported on 02/28/2016    . pantoprazole (PROTONIX) 40 MG tablet Take 1 tablet (40 mg total) by mouth 2 (two) times daily before a meal. 60 tablet 1  . triamterene-hydrochlorothiazide (DYAZIDE) 37.5-25 MG per capsule Take 1 each (1 capsule total) by mouth daily.     No current facility-administered medications for this encounter.    REVIEW OF SYSTEMS:  On review of systems, the patient reports that she is doing well overall. She describes rare occasions of hemoptysis and shortness of breath with exertion. She describes pain in her left axillary and upper chest wall for which she takes norco. This relieves her symptoms. She has had a poor appetite and has lost about 8 pounds in the last month unintentionally. She denies any  fevers, chills, night sweats. She denies any bowel or bladder disturbances, and denies abdominal pain, nausea or vomiting. She denies any new musculoskeletal or joint aches or pains. A complete review of systems is obtained and is otherwise negative.    PHYSICAL EXAM:  height is '4\' 8"'$  (1.422 m) and weight is 174 lb 6.4 oz (79.107 kg). Her blood pressure is 110/82 and  her pulse is 98. Her respiration is 18 and oxygen saturation is 99%.   Pain Scale 3/10 In general this is a well appearing African American female in no acute distress. She is alert and oriented x4 and appropriate throughout the examination. HEENT reveals that the patient is normocephalic, atraumatic. EOMs are intact. PERRLA. Skin is intact without any evidence of gross lesions. Cardiovascular exam reveals a regular rate and rhythm, no clicks rubs or murmurs are auscultated. Chest is clear to auscultation bilaterally. Lymphatic assessment is performed and does not reveal any adenopathy in the cervical, supraclavicular, axillary, or inguinal chains. Abdomen has active bowel sounds in all quadrants and is intact. The abdomen is soft, non tender, non distended. Lower extremities are negative for pretibial pitting edema,  deep calf tenderness, cyanosis or clubbing.   KPS = 80  100 - Normal; no complaints; no evidence of disease. 90   - Able to carry on normal activity; minor signs or symptoms of disease. 80   - Normal activity with effort; some signs or symptoms of disease. 100   - Cares for self; unable to carry on normal activity or to do active work. 60   - Requires occasional assistance, but is able to care for most of his personal needs. 50   - Requires considerable assistance and frequent medical care. 33   - Disabled; requires special care and assistance. 35   - Severely disabled; hospital admission is indicated although death not imminent. 28   - Very sick; hospital admission necessary; active supportive treatment necessary. 10   - Moribund; fatal processes progressing rapidly. 0     - Dead  Karnofsky DA, Abelmann [place], Craver LS and Burchenal JH [phone]) The use of the nitrogen mustards in the palliative treatment of carcinoma: with particular reference to bronchogenic carcinoma Cancer 1 634-56  LABORATORY DATA:  Lab Results  Component Value Date   WBC 11.7* 03/20/2016   HGB 9.7* 03/20/2016     HCT 30.5* 03/20/2016   MCV 66.4* 03/20/2016   PLT 314 03/20/2016   Lab Results  Component Value Date   NA 138 03/20/2016   K 4.1 03/20/2016   CL 103 03/20/2016   CO2 24 03/20/2016   Lab Results  Component Value Date   ALT 27 09/03/2013   AST 21 09/03/2013   ALKPHOS 105 09/03/2013   BILITOT 0.2* 09/03/2013     RADIOGRAPHY: Nm Pet Image Initial (pi) Skull Base To Thigh  03/12/2016  CLINICAL DATA:  Initial treatment strategy for left upper lobe lung mass with chest wall invasion. Right apical low-density cystic lesion. EXAM: NUCLEAR MEDICINE PET SKULL BASE TO THIGH TECHNIQUE: 8.4 mCi F-18 FDG was injected intravenously. Full-ring PET imaging was performed from the skull base to thigh after the radiotracer. CT data was obtained and used for attenuation correction and anatomic localization. FASTING BLOOD GLUCOSE:  Value: 129 mg/dl COMPARISON:  Chest CT of 02/17/2016. FINDINGS: Mild degradation secondary to patient body habitus. NECK No areas of abnormal hypermetabolism. CHEST The well-circumscribed low-density right medial apical lesion corresponds to mild hypermetabolism. This measures 3.2 x 2.5 cm and a S.U.V. max of 5.8 on image 46/series 4. A dominant left upper lobe lung mass with chest wall invasion corresponds to marked hypermetabolism. This measures a S.U.V. max of 47.6 SUV. The surrounding ground-glass opacity and septal thickening demonstrates more low-level hypermetabolism, favoring lymphangitic tumor spread. No thoracic nodal hypermetabolism. ABDOMEN/PELVIS No abnormal soft tissue hypermetabolism. SKELETON mildly heterogeneous marrow hypermetabolism throughout. This is favored to be artifactual and related to patient size. The most focal area is in the left acetabulum and measures a S.U.V. max of 4.6. This is without CT correlate. CT IMAGES PERFORMED FOR ATTENUATION CORRECTION Probable sebaceous cyst about the posterior right scalp on image 8/ series 4, 1.5 cm. No cervical adenopathy.  Bilateral carotid atherosclerosis. Chest findings deferred to recent diagnostic chest CT. Aortic and branch vessel atherosclerosis. Normal adrenal glands. Interpolar left renal cyst. Prominent caudate lobe with hepatomegaly. Abdominal aortic atherosclerosis. Hysterectomy. IMPRESSION: 1. Dominant hypermetabolic left upper lobe lung mass with direct chest wall invasion , consistent with primary bronchogenic carcinoma. Lower level hypermetabolism within the surrounding ground-glass opacity and septal thickening, most consistent with lymphangitic tumor spread. 2. No thoracic nodal hypermetabolism identified. 3. The right  apical well-circumscribed low-density lesion demonstrates hypermetabolism and is only minimally enlarged compared to 2014. This remains suspicious for a nerve sheath tumor versus neuroenteric cyst. 4. Mild limitations secondary to patient body habitus. Heterogeneous marrow density which is favored to be artifactual and secondary. No convincing evidence of osseous metastasis. 5.  Aortic atherosclerosis. Electronically Signed   By: Abigail Miyamoto M.D.   On: 03/12/2016 12:31   Ir Fluoro Guide Cv Line Right  03/20/2016  INDICATION: 65 year old with lung cancer.  Port-A-Cath needed for treatment. EXAM: FLUOROSCOPIC AND ULTRASOUND GUIDED PLACEMENT OF A SUBCUTANEOUS PORT COMPARISON:  None. MEDICATIONS: Ancef 2 g; The antibiotic was administered within an appropriate time interval prior to skin puncture. ANESTHESIA/SEDATION: Versed 3.0 mg IV; Fentanyl 50 mcg IV; Moderate Sedation Time:  39 minutes The patient was continuously monitored during the procedure by the interventional radiology nurse under my direct supervision. FLUOROSCOPY TIME:  30 seconds, 7 mGy COMPLICATIONS: None immediate. PROCEDURE: The procedure, risks, benefits, and alternatives were explained to the patient. Questions regarding the procedure were encouraged and answered. The patient understands and consents to the procedure. Patient was  placed supine on the interventional table. Ultrasound confirmed a patent right internal jugular vein. The right chest and neck were cleaned with a skin antiseptic and a sterile drape was placed. Maximal barrier sterile technique was utilized including caps, mask, sterile gowns, sterile gloves, sterile drape, hand hygiene and skin antiseptic. The right neck was anesthetized with 1% lidocaine. Small incision was made in the right neck with a blade. Micropuncture set was placed in the right internal jugular vein with ultrasound guidance. The micropuncture wire was used for measurement purposes. The right chest was anesthetized with 1% lidocaine with epinephrine. #15 blade was used to make an incision and a subcutaneous port pocket was formed. [place] was assembled. Subcutaneous tunnel was formed with a stiff tunneling device. The port catheter was brought through the subcutaneous tunnel. The port was placed in the subcutaneous pocket and sutured in place. The micropuncture set was exchanged for a peel-away sheath. The catheter was placed through the peel-away sheath and the tip was positioned at the superior cavoatrial junction. Catheter placement was confirmed with fluoroscopy. The port was accessed and flushed with heparinized saline. The port pocket was closed using two layers of absorbable sutures and Dermabond. The vein skin site was closed using a single layer of absorbable suture and Dermabond. Sterile dressings were applied. Patient tolerated the procedure well without an immediate complication. Ultrasound and fluoroscopic images were taken and saved for this procedure. IMPRESSION: Placement of a subcutaneous port device. Catheter tip at the superior cavoatrial junction. Electronically Signed   By: Markus Daft M.D.   On: 03/20/2016 13:59   Ir US Guide Vasc Access Right  03/20/2016  INDICATION: 65 year old with lung cancer.  Port-A-Cath needed for treatment. EXAM: FLUOROSCOPIC AND ULTRASOUND GUIDED  PLACEMENT OF A SUBCUTANEOUS PORT COMPARISON:  None. MEDICATIONS: Ancef 2 g; The antibiotic was administered within an appropriate time interval prior to skin puncture. ANESTHESIA/SEDATION: Versed 3.0 mg IV; Fentanyl 50 mcg IV; Moderate Sedation Time:  39 minutes The patient was continuously monitored during the procedure by the interventional radiology nurse under my direct supervision. FLUOROSCOPY TIME:  30 seconds, 7 mGy COMPLICATIONS: None immediate. PROCEDURE: The procedure, risks, benefits, and alternatives were explained to the patient. Questions regarding the procedure were encouraged and answered. The patient understands and consents to the procedure. Patient was placed supine on the interventional table. Ultrasound confirmed a patent  right internal jugular vein. The right chest and neck were cleaned with a skin antiseptic and a sterile drape was placed. Maximal barrier sterile technique was utilized including caps, mask, sterile gowns, sterile gloves, sterile drape, hand hygiene and skin antiseptic. The right neck was anesthetized with 1% lidocaine. Small incision was made in the right neck with a blade. Micropuncture set was placed in the right internal jugular vein with ultrasound guidance. The micropuncture wire was used for measurement purposes. The right chest was anesthetized with 1% lidocaine with epinephrine. #15 blade was used to make an incision and a subcutaneous port pocket was formed. [place] was assembled. Subcutaneous tunnel was formed with a stiff tunneling device. The port catheter was brought through the subcutaneous tunnel. The port was placed in the subcutaneous pocket and sutured in place. The micropuncture set was exchanged for a peel-away sheath. The catheter was placed through the peel-away sheath and the tip was positioned at the superior cavoatrial junction. Catheter placement was confirmed with fluoroscopy. The port was accessed and flushed with heparinized saline. The  port pocket was closed using two layers of absorbable sutures and Dermabond. The vein skin site was closed using a single layer of absorbable suture and Dermabond. Sterile dressings were applied. Patient tolerated the procedure well without an immediate complication. Ultrasound and fluoroscopic images were taken and saved for this procedure. IMPRESSION: Placement of a subcutaneous port device. Catheter tip at the superior cavoatrial junction. Electronically Signed   By: Markus Daft M.D.   On: 03/20/2016 13:59      IMPRESSION: Stage IIB, T3, N0, M0, NSCLC, poorly differentiated carcinoma, of the left lung.   PLAN: Dr. Tammi Klippel discusses the findings of the patient's imaging, and pathology. He reviews the nature of Stage II lung cancer and recommends proceeding with radiotherapy to the left chest. He discusses the logistics and delivery of radiotherapy and the patient confirms that she would like to be treated her in our [place] office. Dr. Tammi Klippel recommends 33 treatments over 6 1/2 weeks, and discusses the risks, benefits, short, and long term effects of this type of treatment. She is interested in proceeding. We will plan to being her simulation on   The above documentation reflects my direct findings during this shared patient visit. Please see the separate note by Dr. Tammi Klippel on this date for the remainder of the patient's plan of care.   Carola Rhine, PAC

## 2016-03-23 ENCOUNTER — Other Ambulatory Visit (HOSPITAL_COMMUNITY): Payer: Self-pay | Admitting: Hematology & Oncology

## 2016-03-27 ENCOUNTER — Ambulatory Visit
Admission: RE | Admit: 2016-03-27 | Discharge: 2016-03-27 | Disposition: A | Discharge status: Home / Self Care | Payer: Medicaid Other | Source: Ambulatory Visit | Attending: Radiation Oncology | Admitting: Radiation Oncology

## 2016-03-27 DIAGNOSIS — F17210 Nicotine dependence, cigarettes, uncomplicated: Secondary | ICD-10-CM | POA: Diagnosis not present

## 2016-03-27 DIAGNOSIS — Z510 Encounter for antineoplastic radiation therapy: Secondary | ICD-10-CM | POA: Diagnosis not present

## 2016-03-27 DIAGNOSIS — I10 Essential (primary) hypertension: Secondary | ICD-10-CM | POA: Diagnosis not present

## 2016-03-27 DIAGNOSIS — E119 Type 2 diabetes mellitus without complications: Secondary | ICD-10-CM | POA: Diagnosis not present

## 2016-03-27 DIAGNOSIS — E669 Obesity, unspecified: Secondary | ICD-10-CM | POA: Diagnosis not present

## 2016-03-27 DIAGNOSIS — C3412 Malignant neoplasm of upper lobe, left bronchus or lung: Secondary | ICD-10-CM | POA: Diagnosis present

## 2016-03-28 MED ORDER — PROCHLORPERAZINE MALEATE 10 MG PO TABS: 10.0000 mg | ORAL_TABLET | Freq: Four times a day (QID) | ORAL | Status: AC | PRN

## 2016-03-28 MED ORDER — LIDOCAINE-PRILOCAINE 2.5-2.5 % EX CREA: TOPICAL_CREAM | CUTANEOUS | Status: AC

## 2016-03-28 MED ORDER — ONDANSETRON HCL 8 MG PO TABS: 8.0000 mg | ORAL_TABLET | Freq: Three times a day (TID) | ORAL | Status: AC | PRN

## 2016-03-31 ENCOUNTER — Encounter (HOSPITAL_COMMUNITY): Payer: Self-pay | Admitting: Oncology

## 2016-03-31 ENCOUNTER — Ambulatory Visit
Admission: RE | Admit: 2016-03-31 | Discharge: 2016-03-31 | Disposition: A | Discharge status: Home / Self Care | Payer: Medicaid Other | Source: Ambulatory Visit | Attending: Radiation Oncology | Admitting: Radiation Oncology

## 2016-03-31 ENCOUNTER — Encounter: Payer: Self-pay | Admitting: *Deleted

## 2016-03-31 ENCOUNTER — Encounter (HOSPITAL_COMMUNITY): Payer: Medicaid Other | Attending: Oncology | Admitting: Oncology

## 2016-03-31 ENCOUNTER — Encounter (HOSPITAL_BASED_OUTPATIENT_CLINIC_OR_DEPARTMENT_OTHER): Payer: Medicaid Other

## 2016-03-31 VITALS — BP 105/59 | HR 67 | Temp 98.1°F | Resp 16

## 2016-03-31 VITALS — BP 145/117 | HR 82 | Temp 98.5°F | Resp 18 | Wt 171.5 lb

## 2016-03-31 DIAGNOSIS — D649 Anemia, unspecified: Secondary | ICD-10-CM | POA: Diagnosis present

## 2016-03-31 DIAGNOSIS — Z510 Encounter for antineoplastic radiation therapy: Secondary | ICD-10-CM | POA: Diagnosis not present

## 2016-03-31 DIAGNOSIS — Z5111 Encounter for antineoplastic chemotherapy: Secondary | ICD-10-CM

## 2016-03-31 DIAGNOSIS — C3412 Malignant neoplasm of upper lobe, left bronchus or lung: Secondary | ICD-10-CM

## 2016-03-31 LAB — CBC WITH DIFFERENTIAL/PLATELET
Basophils Absolute: 0 10*3/uL (ref 0.0–0.1)
Basophils Relative: 0 %
EOS ABS: 0.3 10*3/uL (ref 0.0–0.7)
EOS PCT: 3 %
HCT: 28.8 % — ABNORMAL LOW (ref 36.0–46.0)
Hemoglobin: 8.8 g/dL — ABNORMAL LOW (ref 12.0–15.0)
LYMPHS ABS: 2.2 10*3/uL (ref 0.7–4.0)
Lymphocytes Relative: 22 %
MCH: 21 pg — AB (ref 26.0–34.0)
MCHC: 30.6 g/dL (ref 30.0–36.0)
MCV: 68.7 fL — ABNORMAL LOW (ref 78.0–100.0)
MONO ABS: 0.7 10*3/uL (ref 0.1–1.0)
Monocytes Relative: 7 %
Neutro Abs: 6.7 10*3/uL (ref 1.7–7.7)
Neutrophils Relative %: 68 %
PLATELETS: 300 10*3/uL (ref 150–400)
RBC: 4.19 MIL/uL (ref 3.87–5.11)
RDW: 23.4 % — AB (ref 11.5–15.5)
WBC: 9.9 10*3/uL (ref 4.0–10.5)

## 2016-03-31 LAB — COMPREHENSIVE METABOLIC PANEL
ALT: 19 U/L (ref 14–54)
ANION GAP: 7 (ref 5–15)
AST: 15 U/L (ref 15–41)
Albumin: 3.1 g/dL — ABNORMAL LOW (ref 3.5–5.0)
Alkaline Phosphatase: 118 U/L (ref 38–126)
BUN: 36 mg/dL — ABNORMAL HIGH (ref 6–20)
CHLORIDE: 105 mmol/L (ref 101–111)
CO2: 26 mmol/L (ref 22–32)
Calcium: 9.5 mg/dL (ref 8.9–10.3)
Creatinine, Ser: 1.6 mg/dL — ABNORMAL HIGH (ref 0.44–1.00)
GFR, EST AFRICAN AMERICAN: 39 mL/min — AB (ref >60)
GFR, EST NON AFRICAN AMERICAN: 33 mL/min — AB (ref >60)
Glucose, Bld: 150 mg/dL — ABNORMAL HIGH (ref 65–99)
POTASSIUM: 3.9 mmol/L (ref 3.5–5.1)
SODIUM: 138 mmol/L (ref 135–145)
Total Bilirubin: 0.4 mg/dL (ref 0.3–1.2)
Total Protein: 7.4 g/dL (ref 6.5–8.1)

## 2016-03-31 LAB — FERRITIN: Ferritin: 206 ng/mL (ref 11–307)

## 2016-03-31 LAB — IRON AND TIBC
IRON: 31 ug/dL (ref 28–170)
SATURATION RATIOS: 13 % (ref 10.4–31.8)
TIBC: 234 ug/dL — ABNORMAL LOW (ref 250–450)
UIBC: 203 ug/dL

## 2016-03-31 MED ORDER — HEPARIN SOD (PORK) LOCK FLUSH 100 UNIT/ML IV SOLN
INTRAVENOUS | Status: AC
  Filled 2016-03-31: qty 5

## 2016-03-31 MED ORDER — FAMOTIDINE IN NACL 20-0.9 MG/50ML-% IV SOLN
20.0000 mg | Freq: Once | INTRAVENOUS | Status: AC
  Administered 2016-03-31: 20 mg via INTRAVENOUS
  Filled 2016-03-31: qty 50

## 2016-03-31 MED ORDER — DEXTROSE 5 % IV SOLN
45.0000 mg/m2 | Freq: Once | INTRAVENOUS | Status: AC
  Administered 2016-03-31: 78 mg via INTRAVENOUS
  Filled 2016-03-31: qty 13

## 2016-03-31 MED ORDER — SODIUM CHLORIDE 0.9 % IV SOLN
Freq: Once | INTRAVENOUS | Status: AC
  Administered 2016-03-31: 09:00:00 via INTRAVENOUS

## 2016-03-31 MED ORDER — PALONOSETRON HCL INJECTION 0.25 MG/5ML
0.2500 mg | Freq: Once | INTRAVENOUS | Status: AC
  Administered 2016-03-31: 0.25 mg via INTRAVENOUS
  Filled 2016-03-31: qty 5

## 2016-03-31 MED ORDER — HEPARIN SOD (PORK) LOCK FLUSH 100 UNIT/ML IV SOLN
500.0000 [IU] | Freq: Once | INTRAVENOUS | Status: AC | PRN
  Administered 2016-03-31: 500 [IU]

## 2016-03-31 MED ORDER — SODIUM CHLORIDE 0.9 % IV SOLN
130.4000 mg | Freq: Once | INTRAVENOUS | Status: AC
  Administered 2016-03-31: 130 mg via INTRAVENOUS
  Filled 2016-03-31: qty 13

## 2016-03-31 MED ORDER — DIPHENHYDRAMINE HCL 50 MG/ML IJ SOLN
50.0000 mg | Freq: Once | INTRAMUSCULAR | Status: AC
  Administered 2016-03-31: 50 mg via INTRAVENOUS
  Filled 2016-03-31: qty 1

## 2016-03-31 MED ORDER — SODIUM CHLORIDE 0.9 % IV SOLN
20.0000 mg | Freq: Once | INTRAVENOUS | Status: AC
  Administered 2016-03-31: 20 mg via INTRAVENOUS
  Filled 2016-03-31: qty 2

## 2016-03-31 MED ORDER — SODIUM CHLORIDE 0.9% FLUSH
10.0000 mL | INTRAVENOUS | Status: DC | PRN
  Administered 2016-03-31: 10 mL
  Filled 2016-03-31: qty 10

## 2016-03-31 NOTE — Progress Notes (Signed)
[facility] Clinical Social Work  Clinical Social Work was referred by nurse for assessment of psychosocial needs due to concerns affording gas to treatment. Clinical Social Worker met with patient at [facility] to offer support and assess for needs.  Pt and husband report they come from [place] for treatment and need help with gas. Pt and husband are aware we don't currently have gas cards available. Pt plans to see about help with this through [facility] where she is coming for treatment. CSW made referral to financial counselor both here and at [facility]. They report that is their biggest concern at this time.CSW provided handout with resources and CSW info sheet. They agree to reach out as needed. CSW to follow.     Clinical Social Work interventions: Resource education  Grier Hock, LCSW [facility] Tuesdays   [phone]  

## 2016-03-31 NOTE — Assessment & Plan Note (Addendum)
Stage IIB poorly differentiated left pancoast tumor with direct invasion into the chest wall (T3N0M0), measuring 9.6 cm in largest dimension with PET scan on 03/12/2016 demonstrating direct chest wall invasion and findings suspicious for lymphangitic spread.  Biopsy unable identify phenotype but poorly differentiated.  Oncology history updated.  Staging in [facility] problem list completed.  Pre-treatment labs: CBC diff, CMET.  I personally reviewed and went over laboratory results with the patient.  The results are noted within this dictation.  Labs satisfy treatment criteria.  She is here for cycle #1 of treatment.  She will also start XRT today as well.  I reviewed the risks, benefits, alternatives, and side effects of chemotherapy.  "We all need to be on the same page," the patient's husband reports.  His comments are very confusing and often do not relate to the patient or her cancer care, but instead his cardiology or past oncology care ~30 years ago.  As a result of his comments, I reviewed the patient's current treatment plan which is concomitant chemoXRT consisting of Carboplatin/Paclitaxel weekly and XRT x 33 fractions over 6.5 weeks.  After discussion, he admits that he has not heard anything differently regarding his wife's oncology care and he is unable to provide tangible information that anyone was on a "different page" than what we reviewed.  He provides me information that is in no way related to his wife, but somehow results in his concern about everyone being on the "same page."  I could not follow the information or make sense of it.  He is certainly satisfied with the current outlined plan.  Worsening anemia is noted, as a result, iron studies are drawn today.  The patient and her husband are informed of this and the potential for IV iron in the future.    She will return in 1 week, with continuation of XRT as outlined, for cycle #2 of chemotherapy.

## 2016-03-31 NOTE — Patient Instructions (Signed)
[facility] Discharge Instructions for Patients Receiving Chemotherapy   Beginning January 23rd 2017 lab work for the [facility] will be done in the  Main lab at [facility] on 1st floor. If you have a lab appointment with the [facility] please come in thru the  Main Entrance and check in at the main information desk   Today you received the following chemotherapy agents Taxol and carbo week 1 of 6.  To help prevent nausea and vomiting after your treatment, we encourage you to take your nausea medication as directed.   If you develop nausea and vomiting, or diarrhea that is not controlled by your medication, call the clinic.  The clinic phone number is [phone]. Office hours are Monday-Friday 8:30am-5:00pm.  BELOW ARE SYMPTOMS THAT SHOULD BE REPORTED IMMEDIATELY:  *FEVER GREATER THAN 101.0 F  *CHILLS WITH OR WITHOUT FEVER  NAUSEA AND VOMITING THAT IS NOT CONTROLLED WITH YOUR NAUSEA MEDICATION  *UNUSUAL SHORTNESS OF BREATH  *UNUSUAL BRUISING OR BLEEDING  TENDERNESS IN MOUTH AND THROAT WITH OR WITHOUT PRESENCE OF ULCERS  *URINARY PROBLEMS  *BOWEL PROBLEMS  UNUSUAL RASH Items with * indicate a potential emergency and should be followed up as soon as possible. If you have an emergency after office hours please contact your primary care physician or go to the nearest emergency department.  Please call the clinic during office hours if you have any questions or concerns.   You may also contact the Patient Navigator at [phone] should you have any questions or need assistance in obtaining follow up care.      Resources For Cancer Patients and their Caregivers ? American Cancer Society: Can assist with transportation, wigs, general needs, runs Look Good Feel Better.        (319)038-7619 ? Cancer Care: Provides financial assistance, online support groups, medication/co-pay assistance.  1-800-813-HOPE (618)047-8139) ? [facility] Assists [place] cancer patients and their families through emotional , educational and financial support.  684-486-2644 ? [place] DSS Where to apply for food stamps, Medicaid and utility assistance. 8100065664 ? [facility]: Transportation to medical appointments. 769 810 7078 ? Social Security Administration: May apply for disability if have a Stage IV cancer. 202-882-9496 806-251-5710 ? [employer], Disability and Transit Services: Assists with nutrition, care and transit needs. 9363393605

## 2016-03-31 NOTE — Progress Notes (Signed)
Susan Fairy, PA-C [address] [place]  [id number]  Pancoast tumor of left lung ([place])  Anemia, unspecified anemia type - Plan: Ferritin, Iron and TIBC, Ferritin, Iron and TIBC  CURRENT THERAPY: Weekly Carboplatin/Paclitaxel with concurrent XRT beginning on 03/31/2016  INTERVAL HISTORY: Susan Mahoney 65 y.o. 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  Biopsy unable identify phenotype but poorly differentiated.     Pancoast tumor of left lung ([place])   02/16/2016 - 02/20/2016 Hospital Admission Symptomatic anemia, lung mass   02/17/2016 Imaging CT chest with 9.6 x 6.1 x 6.6 cm macrolobulated mass in LUL with evidence of direct chest wall invasion, surrounding opacities ? early lymphangitic spread. No mediastinal or hilar LAD. pleural based lesion R apex prob cystic present 09/19/2013   02/18/2016 Procedure Colonoscopy with diverticulosis, five polyps   02/19/2016 Initial Biopsy Ct biopsy in IR   02/19/2016 Pathology Results poorly diff malignancy, unclear phenotype. carcinoma and melanoma are ruled out, calretinin positive staining may be seen in mesotheliomas in addition to other tumors   02/19/2016 Procedure EGD medium sized hiatal hernia, non obstructing schatzki ring, esophagitis   03/12/2016 PET scan Hypermetabolic LUL lung mass with direct chest wall invasion. Lower level hypermetabolism within the surrounding ground-glass opacity and septal thickening, most consistent with lympangitic spread. No thoracic adenopathy.    03/20/2016 Procedure Port placed   03/31/2016 -  Chemotherapy Weekly Carboplatin/Paclitaxel with XRT   03/31/2016 -  Radiation Therapy XRT in [place] with Dr. Tammi Klippel for 33 fractions over 6.5 weeks.   She is doing well  from an oncology perspective.  She received her pre-meds which consisted of Benadryl.  As a result, her participation in conversation today is limited due to drowsiness.   Her husband wants to make sure that her cancer care is organized and planned according to what they have been told.  We reviewed her treatment plan which does line up with his understanding of treatment moving forward.    She denies any complaints today but she asks a few questions regarding side effects of chemotherapy.  We reviewed chemotherapy-induced fatigue, nausea, vomiting, peripheral neuropathy, etc.  She knows to call us with any issues moving forward.  Review of Systems  Constitutional: Negative.  Negative for fever and chills.  HENT: Negative.   Eyes: Negative.   Respiratory: Negative.   Cardiovascular: Negative.   Gastrointestinal: Negative.   Genitourinary: Negative.   Musculoskeletal: Negative.   Skin: Negative.   Neurological: Negative.   Endo/Heme/Allergies: Negative.   Psychiatric/Behavioral: Negative.     Past Medical History  Diagnosis Date  . Hypertension   . Sinus infection   . Diabetes mellitus, type 2 ([place])   . Obesity   . Anemia   . Tobacco abuse   . Pancoast tumor of left lung ([place]) 03/03/2016  . Low vitamin B12 level 02/28/2016    Past Surgical History  Procedure Laterality Date  . Abdominal hysterectomy    . Esophagogastroduodenoscopy N/A 02/18/2016    Procedure: ESOPHAGOGASTRODUODENOSCOPY (EGD);  Surgeon: Daneil Dolin, MD;  Location: [facility];  Service: Endoscopy;  Laterality: N/A;  . Colonoscopy N/A 02/18/2016    Procedure: COLONOSCOPY;  Surgeon: Daneil Dolin, MD;  Location: [facility];  Service: Endoscopy;  Laterality:  N/A;    Family History  Problem Relation Age of Onset  . Cancer Father     Social History   Social History  . Marital Status: Married    Spouse Name: N/A  . Number of Children: N/A  . Years of Education: N/A   Social History Main Topics  .  Smoking status: Former Smoker -- 0.50 packs/day for 30 years    Types: Cigarettes    Quit date: 03/13/2016  . Smokeless tobacco: Never Used  . Alcohol Use: No  . Drug Use: No  . Sexual Activity: No   Other Topics Concern  . None   Social History Narrative     PHYSICAL EXAMINATION  ECOG PERFORMANCE STATUS: 1 - Symptomatic but completely ambulatory  Filed Vitals:   03/31/16 0833  BP: 145/117  Pulse: 82  Temp: 98.5 F (36.9 C)  Resp: 18    GENERAL:alert, well nourished, well developed, comfortable, cooperative, obese, smiling and drowsy secondary to pre-meds, accompanied by her husband and daughter. SKIN: skin color, texture, turgor are normal, no rashes or significant lesions HEAD: Normocephalic, No masses, lesions, tenderness or abnormalities EYES: normal, EOMI, Conjunctiva are pink and non-injected EARS: External ears normal OROPHARYNX:lips, buccal mucosa, and tongue normal and mucous membranes are moist  NECK: supple, trachea midline LYMPH:  no palpable lymphadenopathy BREAST:not examined LUNGS: clear to auscultation  HEART: regular rate & rhythm ABDOMEN:abdomen soft and obese BACK: Back symmetric, no curvature. EXTREMITIES:less then 2 second capillary refill, no joint deformities, effusion, or inflammation, no skin discoloration, no cyanosis  NEURO: alert & oriented x 3 with fluent speech, no focal motor/sensory deficits, gait normal   LABORATORY DATA: CBC    Component Value Date/Time   WBC 9.9 03/31/2016 0827   RBC 4.19 03/31/2016 0827   RBC 3.62* 02/17/2016 0752   HGB 8.8* 03/31/2016 0827   HCT 28.8* 03/31/2016 0827   PLT 300 03/31/2016 0827   MCV 68.7* 03/31/2016 0827   MCH 21.0* 03/31/2016 0827   MCHC 30.6 03/31/2016 0827   RDW 23.4* 03/31/2016 0827   LYMPHSABS 2.2 03/31/2016 0827   MONOABS 0.7 03/31/2016 0827   EOSABS 0.3 03/31/2016 0827   BASOSABS 0.0 03/31/2016 0827      Chemistry      Component Value Date/Time   NA 138 03/31/2016 0827   K  3.9 03/31/2016 0827   CL 105 03/31/2016 0827   CO2 26 03/31/2016 0827   BUN 36* 03/31/2016 0827   CREATININE 1.60* 03/31/2016 0827      Component Value Date/Time   CALCIUM 9.5 03/31/2016 0827   ALKPHOS 118 03/31/2016 0827   AST 15 03/31/2016 0827   ALT 19 03/31/2016 0827   BILITOT 0.4 03/31/2016 0827        PENDING LABS:   RADIOGRAPHIC STUDIES:  Nm Pet Image Initial (pi) Skull Base To Thigh  03/12/2016  CLINICAL DATA:  Initial treatment strategy for left upper lobe lung mass with chest wall invasion. Right apical low-density cystic lesion. EXAM: NUCLEAR MEDICINE PET SKULL BASE TO THIGH TECHNIQUE: 8.4 mCi F-18 FDG was injected intravenously. Full-ring PET imaging was performed from the skull base to thigh after the radiotracer. CT data was obtained and used for attenuation correction and anatomic localization. FASTING BLOOD GLUCOSE:  Value: 129 mg/dl COMPARISON:  Chest CT of 02/17/2016. FINDINGS: Mild degradation secondary to patient body habitus. NECK No areas of abnormal hypermetabolism. CHEST The well-circumscribed low-density right medial apical lesion corresponds to mild hypermetabolism. This measures 3.2 x 2.5 cm and  a S.U.V. max of 5.8 on image 46/series 4. A dominant left upper lobe lung mass with chest wall invasion corresponds to marked hypermetabolism. This measures a S.U.V. max of 47.6 SUV. The surrounding ground-glass opacity and septal thickening demonstrates more low-level hypermetabolism, favoring lymphangitic tumor spread. No thoracic nodal hypermetabolism. ABDOMEN/PELVIS No abnormal soft tissue hypermetabolism. SKELETON mildly heterogeneous marrow hypermetabolism throughout. This is favored to be artifactual and related to patient size. The most focal area is in the left acetabulum and measures a S.U.V. max of 4.6. This is without CT correlate. CT IMAGES PERFORMED FOR ATTENUATION CORRECTION Probable sebaceous cyst about the posterior right scalp on image 8/ series 4, 1.5 cm.  No cervical adenopathy. Bilateral carotid atherosclerosis. Chest findings deferred to recent diagnostic chest CT. Aortic and branch vessel atherosclerosis. Normal adrenal glands. Interpolar left renal cyst. Prominent caudate lobe with hepatomegaly. Abdominal aortic atherosclerosis. Hysterectomy. IMPRESSION: 1. Dominant hypermetabolic left upper lobe lung mass with direct chest wall invasion , consistent with primary bronchogenic carcinoma. Lower level hypermetabolism within the surrounding ground-glass opacity and septal thickening, most consistent with lymphangitic tumor spread. 2. No thoracic nodal hypermetabolism identified. 3. The right apical well-circumscribed low-density lesion demonstrates hypermetabolism and is only minimally enlarged compared to 2014. This remains suspicious for a nerve sheath tumor versus neuroenteric cyst. 4. Mild limitations secondary to patient body habitus. Heterogeneous marrow density which is favored to be artifactual and secondary. No convincing evidence of osseous metastasis. 5.  Aortic atherosclerosis. Electronically Signed   By: Abigail Miyamoto M.D.   On: 03/12/2016 12:31   Ir Fluoro Guide Cv Line Right  03/20/2016  INDICATION: 65 year old with lung cancer.  Port-A-Cath needed for treatment. EXAM: FLUOROSCOPIC AND ULTRASOUND GUIDED PLACEMENT OF A SUBCUTANEOUS PORT COMPARISON:  None. MEDICATIONS: Ancef 2 g; The antibiotic was administered within an appropriate time interval prior to skin puncture. ANESTHESIA/SEDATION: Versed 3.0 mg IV; Fentanyl 50 mcg IV; Moderate Sedation Time:  39 minutes The patient was continuously monitored during the procedure by the interventional radiology nurse under my direct supervision. FLUOROSCOPY TIME:  30 seconds, 7 mGy COMPLICATIONS: None immediate. PROCEDURE: The procedure, risks, benefits, and alternatives were explained to the patient. Questions regarding the procedure were encouraged and answered. The patient understands and consents to the  procedure. Patient was placed supine on the interventional table. Ultrasound confirmed a patent right internal jugular vein. The right chest and neck were cleaned with a skin antiseptic and a sterile drape was placed. Maximal barrier sterile technique was utilized including caps, mask, sterile gowns, sterile gloves, sterile drape, hand hygiene and skin antiseptic. The right neck was anesthetized with 1% lidocaine. Small incision was made in the right neck with a blade. Micropuncture set was placed in the right internal jugular vein with ultrasound guidance. The micropuncture wire was used for measurement purposes. The right chest was anesthetized with 1% lidocaine with epinephrine. #15 blade was used to make an incision and a subcutaneous port pocket was formed. [place] was assembled. Subcutaneous tunnel was formed with a stiff tunneling device. The port catheter was brought through the subcutaneous tunnel. The port was placed in the subcutaneous pocket and sutured in place. The micropuncture set was exchanged for a peel-away sheath. The catheter was placed through the peel-away sheath and the tip was positioned at the superior cavoatrial junction. Catheter placement was confirmed with fluoroscopy. The port was accessed and flushed with heparinized saline. The port pocket was closed using two layers of absorbable sutures and Dermabond. The vein skin  site was closed using a single layer of absorbable suture and Dermabond. Sterile dressings were applied. Patient tolerated the procedure well without an immediate complication. Ultrasound and fluoroscopic images were taken and saved for this procedure. IMPRESSION: Placement of a subcutaneous port device. Catheter tip at the superior cavoatrial junction. Electronically Signed   By: Markus Daft M.D.   On: 03/20/2016 13:59   Ir US Guide Vasc Access Right  03/20/2016  INDICATION: 65 year old with lung cancer.  Port-A-Cath needed for treatment. EXAM:  FLUOROSCOPIC AND ULTRASOUND GUIDED PLACEMENT OF A SUBCUTANEOUS PORT COMPARISON:  None. MEDICATIONS: Ancef 2 g; The antibiotic was administered within an appropriate time interval prior to skin puncture. ANESTHESIA/SEDATION: Versed 3.0 mg IV; Fentanyl 50 mcg IV; Moderate Sedation Time:  39 minutes The patient was continuously monitored during the procedure by the interventional radiology nurse under my direct supervision. FLUOROSCOPY TIME:  30 seconds, 7 mGy COMPLICATIONS: None immediate. PROCEDURE: The procedure, risks, benefits, and alternatives were explained to the patient. Questions regarding the procedure were encouraged and answered. The patient understands and consents to the procedure. Patient was placed supine on the interventional table. Ultrasound confirmed a patent right internal jugular vein. The right chest and neck were cleaned with a skin antiseptic and a sterile drape was placed. Maximal barrier sterile technique was utilized including caps, mask, sterile gowns, sterile gloves, sterile drape, hand hygiene and skin antiseptic. The right neck was anesthetized with 1% lidocaine. Small incision was made in the right neck with a blade. Micropuncture set was placed in the right internal jugular vein with ultrasound guidance. The micropuncture wire was used for measurement purposes. The right chest was anesthetized with 1% lidocaine with epinephrine. #15 blade was used to make an incision and a subcutaneous port pocket was formed. [place] was assembled. Subcutaneous tunnel was formed with a stiff tunneling device. The port catheter was brought through the subcutaneous tunnel. The port was placed in the subcutaneous pocket and sutured in place. The micropuncture set was exchanged for a peel-away sheath. The catheter was placed through the peel-away sheath and the tip was positioned at the superior cavoatrial junction. Catheter placement was confirmed with fluoroscopy. The port was accessed and  flushed with heparinized saline. The port pocket was closed using two layers of absorbable sutures and Dermabond. The vein skin site was closed using a single layer of absorbable suture and Dermabond. Sterile dressings were applied. Patient tolerated the procedure well without an immediate complication. Ultrasound and fluoroscopic images were taken and saved for this procedure. IMPRESSION: Placement of a subcutaneous port device. Catheter tip at the superior cavoatrial junction. Electronically Signed   By: Markus Daft M.D.   On: 03/20/2016 13:59     PATHOLOGY:    ASSESSMENT AND PLAN:  Pancoast tumor of left lung ([place]) Stage IIB poorly differentiated left pancoast tumor with direct invasion into the chest wall (T3N0M0), measuring 9.6 cm in largest dimension with PET scan on 03/12/2016 demonstrating direct chest wall invasion and findings suspicious for lymphangitic spread.  Biopsy unable identify phenotype but poorly differentiated.  Oncology history updated.  Staging in [facility] problem list completed.  Pre-treatment labs: CBC diff, CMET.  I personally reviewed and went over laboratory results with the patient.  The results are noted within this dictation.  Labs satisfy treatment criteria.  She is here for cycle #1 of treatment.  She will also start XRT today as well.  I reviewed the risks, benefits, alternatives, and side effects of chemotherapy.  "We  all need to be on the same page," the patient's husband reports.  His comments are very confusing and often do not relate to the patient or her cancer care, but instead his cardiology or past oncology care ~30 years ago.  As a result of his comments, I reviewed the patient's current treatment plan which is concomitant chemoXRT consisting of Carboplatin/Paclitaxel weekly and XRT x 33 fractions over 6.5 weeks.  After discussion, he admits that he has not heard anything differently regarding his wife's oncology care and he is unable to provide tangible  information that anyone was on a "different page" than what we reviewed.  He provides me information that is in no way related to his wife, but somehow results in his concern about everyone being on the "same page."  I could not follow the information or make sense of it.  He is certainly satisfied with the current outlined plan.  Worsening anemia is noted, as a result, iron studies are drawn today.  The patient and her husband are informed of this and the potential for IV iron in the future.    She will return in 1 week, with continuation of XRT as outlined, for cycle #2 of chemotherapy.    ORDERS PLACED FOR THIS ENCOUNTER: Orders Placed This Encounter  Procedures  . Ferritin  . Iron and TIBC    MEDICATIONS PRESCRIBED THIS ENCOUNTER: No orders of the defined types were placed in this encounter.    THERAPY PLAN:  Treatment as outlined above.  All questions were answered. The patient knows to call the clinic with any problems, questions or concerns. We can certainly see the patient much sooner if necessary.  Patient and plan discussed with Dr. Ancil Linsey and she is in agreement with the aforementioned.   This note is electronically signed by: Doy Mince 03/31/2016 4:46 PM

## 2016-03-31 NOTE — Progress Notes (Signed)
Today is patient's first chemotherapy treatment. OK per T.Kefalas,PA to order and treat with chemo today prior to today's lab results.

## 2016-04-01 ENCOUNTER — Telehealth (HOSPITAL_COMMUNITY): Payer: Self-pay | Admitting: *Deleted

## 2016-04-01 ENCOUNTER — Encounter: Payer: Self-pay | Admitting: Radiation Oncology

## 2016-04-01 ENCOUNTER — Encounter (HOSPITAL_COMMUNITY): Payer: Medicaid Other

## 2016-04-01 ENCOUNTER — Other Ambulatory Visit (HOSPITAL_COMMUNITY): Payer: Self-pay | Admitting: Oncology

## 2016-04-01 ENCOUNTER — Ambulatory Visit
Admission: RE | Admit: 2016-04-01 | Discharge: 2016-04-01 | Disposition: A | Discharge status: Home / Self Care | Payer: Medicaid Other | Source: Ambulatory Visit | Attending: Radiation Oncology | Admitting: Radiation Oncology

## 2016-04-01 DIAGNOSIS — D649 Anemia, unspecified: Secondary | ICD-10-CM

## 2016-04-01 DIAGNOSIS — C3412 Malignant neoplasm of upper lobe, left bronchus or lung: Secondary | ICD-10-CM

## 2016-04-01 DIAGNOSIS — Z510 Encounter for antineoplastic radiation therapy: Secondary | ICD-10-CM | POA: Diagnosis not present

## 2016-04-01 DIAGNOSIS — E538 Deficiency of other specified B group vitamins: Secondary | ICD-10-CM

## 2016-04-01 DIAGNOSIS — R918 Other nonspecific abnormal finding of lung field: Secondary | ICD-10-CM

## 2016-04-01 MED ORDER — HYDROCODONE-ACETAMINOPHEN 5-325 MG PO TABS: 1.0000 | ORAL_TABLET | ORAL | Status: DC | PRN

## 2016-04-01 NOTE — Progress Notes (Signed)
Financial Counseling--I spoke with patients husband today--he will bring in the income verification for him and his wife, they both receive social security--will evaluate for [facility] grant once we receive verifications

## 2016-04-01 NOTE — Telephone Encounter (Signed)
Spoke with patient. States she felt really good last night and ate a good supper. Denies complaints today.

## 2016-04-02 ENCOUNTER — Ambulatory Visit
Admission: RE | Admit: 2016-04-02 | Discharge: 2016-04-02 | Disposition: A | Discharge status: Home / Self Care | Payer: Self-pay | Source: Ambulatory Visit | Attending: Radiation Oncology | Admitting: Radiation Oncology

## 2016-04-02 ENCOUNTER — Encounter: Payer: Self-pay | Admitting: Radiation Oncology

## 2016-04-02 ENCOUNTER — Ambulatory Visit
Admission: RE | Admit: 2016-04-02 | Discharge: 2016-04-02 | Disposition: A | Discharge status: Home / Self Care | Payer: Medicaid Other | Source: Ambulatory Visit | Attending: Radiation Oncology | Admitting: Radiation Oncology

## 2016-04-02 VITALS — BP 112/56 | HR 53 | Temp 97.8°F | Resp 16 | Wt 172.0 lb

## 2016-04-02 DIAGNOSIS — C3412 Malignant neoplasm of upper lobe, left bronchus or lung: Secondary | ICD-10-CM

## 2016-04-02 DIAGNOSIS — R918 Other nonspecific abnormal finding of lung field: Secondary | ICD-10-CM

## 2016-04-02 DIAGNOSIS — E538 Deficiency of other specified B group vitamins: Secondary | ICD-10-CM

## 2016-04-02 DIAGNOSIS — Z510 Encounter for antineoplastic radiation therapy: Secondary | ICD-10-CM | POA: Diagnosis not present

## 2016-04-02 DIAGNOSIS — D649 Anemia, unspecified: Secondary | ICD-10-CM

## 2016-04-02 MED ORDER — HYDROCODONE-ACETAMINOPHEN 5-325 MG PO TABS: 1.0000 | ORAL_TABLET | ORAL | Status: DC | PRN

## 2016-04-02 NOTE — Progress Notes (Addendum)
Weight and vitals stable. Denies pain at this time. Requesting refill of hydrocodone-apap. She explains she was given this prescription when she would discharged from the hospital. Presently she denies pain but, reports this is because she takes this pain medication "two pills every four hours." She goes onto say if she doesn't take the medication scheduled she begins to ache in her left upper chest "where the cancer is." Reports an occasional productive cough. Reports hemoptysis on rare occasions which is much less than it was when she was hospitalized. Denies pain or difficulty associated with swallowing. Reports SOB with exertion. Reports she received her first chemotherapy on Tuesday. Oriented patient to staff and routine of the clinic. Provided patient with RADIATION THERAPY AND YOU handbook then, reviewed pertinent information. Educated patient reference potential side effects and management such as, fatigue, skin changes, increased cough, increased SOB, and throat changes. Provided patient with radiaplex and directed upon use. Answered all patient questions to the best of my ability. Provided patient with my business card and encouraged him to call with needs. Patient verbalized understanding of all reviewed.  BP 112/56 mmHg  Pulse 53  Temp(Src) 97.8 F (36.6 C) (Oral)  Resp 16  Wt 172 lb (78.019 kg)  SpO2 100% Wt Readings from Last 3 Encounters:  04/02/16 172 lb (78.019 kg)  03/31/16 171 lb 8 oz (77.792 kg)  03/20/16 174 lb 6.4 oz (79.107 kg)

## 2016-04-02 NOTE — Progress Notes (Signed)
  Radiation Oncology         [phone]   Name: Susan Mahoney MRN: [id number]   Date: 04/02/2016  DOB: Sep 04, 1951   Weekly Radiation Therapy Management    ICD-9-CM ICD-10-CM   1. Lung mass 786.6 R91.8 HYDROcodone-acetaminophen ([facility]) 5-325 MG tablet  2. Low vitamin B12 level 266.2 E53.8 HYDROcodone-acetaminophen ([facility]CO) 5-325 MG tablet  3. Symptomatic anemia 285.9 D64.9 HYDROcodone-acetaminophen ([facility]CO) 5-325 MG tablet  4. Pancoast tumor of left lung ([facility]) 162.3 C34.12     Current Dose: 6 Gy  Planned Dose:  66 Gy  Narrative Weight and vitals stable. Denies pain at this time. Requesting refill of hydrocodone-apap. She explains she was given this prescription when she would discharged from the hospital. Presently she denies pain but, reports this is because she takes this pain medication "two pills every four hours." She goes onto say if she doesn't take the medication scheduled she begins to ache in her left upper chest "where the cancer is." Reports an occasional productive cough. Reports hemoptysis on rare occasions which is much less than it was when she was hospitalized. Denies pain or difficulty associated with swallowing. Reports SOB with exertion. Reports she received her first chemotherapy on Tuesday.    The patient is without complaint. Set-up films were reviewed. The chart was checked.  Physical Findings  weight is 172 lb (78.019 kg). Her oral temperature is 97.8 F (36.6 C). Her blood pressure is 112/56 and her pulse is 53. Her respiration is 16 and oxygen saturation is 100%. . Weight essentially stable.  No significant changes.  Impression The patient is tolerating radiation.  Plan Continue treatment as planned.         Sheral Apley Tammi Klippel, M.D.  This document serves as a record of services personally performed by Tyler Pita, MD. It was created on his behalf by Truddie Hidden, a trained medical scribe. The creation of this record is based on the scribe's personal  observations and the provider's statements to them. This document has been checked and approved by the attending provider.

## 2016-04-03 ENCOUNTER — Ambulatory Visit
Admission: RE | Admit: 2016-04-03 | Discharge: 2016-04-03 | Disposition: A | Discharge status: Home / Self Care | Payer: Medicaid Other | Source: Ambulatory Visit | Attending: Radiation Oncology | Admitting: Radiation Oncology

## 2016-04-03 DIAGNOSIS — Z510 Encounter for antineoplastic radiation therapy: Secondary | ICD-10-CM | POA: Diagnosis not present

## 2016-04-06 ENCOUNTER — Ambulatory Visit
Admission: RE | Admit: 2016-04-06 | Discharge: 2016-04-06 | Disposition: A | Discharge status: Home / Self Care | Payer: Medicaid Other | Source: Ambulatory Visit | Attending: Radiation Oncology | Admitting: Radiation Oncology

## 2016-04-06 DIAGNOSIS — C3412 Malignant neoplasm of upper lobe, left bronchus or lung: Secondary | ICD-10-CM

## 2016-04-06 DIAGNOSIS — Z510 Encounter for antineoplastic radiation therapy: Secondary | ICD-10-CM | POA: Diagnosis not present

## 2016-04-06 MED ORDER — RADIAPLEXRX EX GEL
Freq: Once | CUTANEOUS | Status: AC
  Administered 2016-04-06: 16:00:00 via TOPICAL

## 2016-04-07 ENCOUNTER — Encounter: Payer: Self-pay | Admitting: *Deleted

## 2016-04-07 ENCOUNTER — Encounter (HOSPITAL_COMMUNITY): Payer: Self-pay | Admitting: Hematology & Oncology

## 2016-04-07 ENCOUNTER — Encounter (HOSPITAL_BASED_OUTPATIENT_CLINIC_OR_DEPARTMENT_OTHER): Payer: Medicaid Other | Admitting: Hematology & Oncology

## 2016-04-07 ENCOUNTER — Encounter (HOSPITAL_BASED_OUTPATIENT_CLINIC_OR_DEPARTMENT_OTHER): Payer: Medicaid Other

## 2016-04-07 ENCOUNTER — Ambulatory Visit (HOSPITAL_COMMUNITY): Payer: Self-pay

## 2016-04-07 ENCOUNTER — Ambulatory Visit
Admission: RE | Admit: 2016-04-07 | Discharge: 2016-04-07 | Disposition: A | Discharge status: Home / Self Care | Payer: Medicaid Other | Source: Ambulatory Visit | Attending: Radiation Oncology | Admitting: Radiation Oncology

## 2016-04-07 VITALS — BP 107/82 | HR 77 | Temp 97.9°F | Resp 18

## 2016-04-07 VITALS — BP 122/58 | HR 97 | Temp 98.6°F | Resp 18 | Wt 172.0 lb

## 2016-04-07 DIAGNOSIS — C3412 Malignant neoplasm of upper lobe, left bronchus or lung: Secondary | ICD-10-CM

## 2016-04-07 DIAGNOSIS — E538 Deficiency of other specified B group vitamins: Secondary | ICD-10-CM

## 2016-04-07 DIAGNOSIS — D649 Anemia, unspecified: Secondary | ICD-10-CM

## 2016-04-07 DIAGNOSIS — Z5111 Encounter for antineoplastic chemotherapy: Secondary | ICD-10-CM

## 2016-04-07 DIAGNOSIS — D509 Iron deficiency anemia, unspecified: Secondary | ICD-10-CM

## 2016-04-07 DIAGNOSIS — R739 Hyperglycemia, unspecified: Secondary | ICD-10-CM

## 2016-04-07 DIAGNOSIS — Z510 Encounter for antineoplastic radiation therapy: Secondary | ICD-10-CM | POA: Diagnosis not present

## 2016-04-07 LAB — CBC WITH DIFFERENTIAL/PLATELET
Basophils Absolute: 0 10*3/uL (ref 0.0–0.1)
Basophils Relative: 0 %
Eosinophils Absolute: 0.2 10*3/uL (ref 0.0–0.7)
Eosinophils Relative: 2 %
HEMATOCRIT: 27.1 % — AB (ref 36.0–46.0)
HEMOGLOBIN: 8.3 g/dL — AB (ref 12.0–15.0)
LYMPHS ABS: 1.2 10*3/uL (ref 0.7–4.0)
Lymphocytes Relative: 17 %
MCH: 20.9 pg — AB (ref 26.0–34.0)
MCHC: 30.6 g/dL (ref 30.0–36.0)
MCV: 68.1 fL — ABNORMAL LOW (ref 78.0–100.0)
Monocytes Absolute: 0.4 10*3/uL (ref 0.1–1.0)
Monocytes Relative: 6 %
NEUTROS ABS: 5.5 10*3/uL (ref 1.7–7.7)
NEUTROS PCT: 75 %
Platelets: 324 10*3/uL (ref 150–400)
RBC: 3.98 MIL/uL (ref 3.87–5.11)
RDW: 23.1 % — ABNORMAL HIGH (ref 11.5–15.5)
WBC: 7.3 10*3/uL (ref 4.0–10.5)

## 2016-04-07 LAB — COMPREHENSIVE METABOLIC PANEL
ALK PHOS: 93 U/L (ref 38–126)
ALT: 15 U/L (ref 14–54)
ANION GAP: 5 (ref 5–15)
AST: 15 U/L (ref 15–41)
Albumin: 3.1 g/dL — ABNORMAL LOW (ref 3.5–5.0)
BILIRUBIN TOTAL: 0.3 mg/dL (ref 0.3–1.2)
BUN: 39 mg/dL — ABNORMAL HIGH (ref 6–20)
CALCIUM: 9 mg/dL (ref 8.9–10.3)
CO2: 25 mmol/L (ref 22–32)
Chloride: 103 mmol/L (ref 101–111)
Creatinine, Ser: 1.52 mg/dL — ABNORMAL HIGH (ref 0.44–1.00)
GFR calc non Af Amer: 35 mL/min — ABNORMAL LOW (ref >60)
GFR, EST AFRICAN AMERICAN: 41 mL/min — AB (ref >60)
Glucose, Bld: 267 mg/dL — ABNORMAL HIGH (ref 65–99)
Potassium: 4.1 mmol/L (ref 3.5–5.1)
Sodium: 133 mmol/L — ABNORMAL LOW (ref 135–145)
TOTAL PROTEIN: 6.8 g/dL (ref 6.5–8.1)

## 2016-04-07 MED ORDER — PACLITAXEL CHEMO INJECTION 300 MG/50ML
45.0000 mg/m2 | Freq: Once | INTRAVENOUS | Status: AC
  Administered 2016-04-07: 78 mg via INTRAVENOUS
  Filled 2016-04-07: qty 13

## 2016-04-07 MED ORDER — FAMOTIDINE IN NACL 20-0.9 MG/50ML-% IV SOLN
20.0000 mg | Freq: Once | INTRAVENOUS | Status: AC
  Administered 2016-04-07: 20 mg via INTRAVENOUS
  Filled 2016-04-07: qty 50

## 2016-04-07 MED ORDER — SODIUM CHLORIDE 0.9 % IV SOLN
144.6000 mg | Freq: Once | INTRAVENOUS | Status: AC
  Administered 2016-04-07: 140 mg via INTRAVENOUS
  Filled 2016-04-07: qty 14

## 2016-04-07 MED ORDER — SODIUM CHLORIDE 0.9% FLUSH
10.0000 mL | INTRAVENOUS | Status: DC | PRN
  Administered 2016-04-07: 10 mL
  Filled 2016-04-07: qty 10

## 2016-04-07 MED ORDER — SODIUM CHLORIDE 0.9 % IV SOLN
20.0000 mg | Freq: Once | INTRAVENOUS | Status: AC
  Administered 2016-04-07: 20 mg via INTRAVENOUS
  Filled 2016-04-07: qty 2

## 2016-04-07 MED ORDER — PALONOSETRON HCL INJECTION 0.25 MG/5ML
0.2500 mg | Freq: Once | INTRAVENOUS | Status: AC
  Administered 2016-04-07: 0.25 mg via INTRAVENOUS
  Filled 2016-04-07: qty 5

## 2016-04-07 MED ORDER — INSULIN ASPART 100 UNIT/ML ~~LOC~~ SOLN
6.0000 [IU] | Freq: Once | SUBCUTANEOUS | Status: AC
  Administered 2016-04-07: 6 [IU] via SUBCUTANEOUS
  Filled 2016-04-07: qty 0.06

## 2016-04-07 MED ORDER — HEPARIN SOD (PORK) LOCK FLUSH 100 UNIT/ML IV SOLN
500.0000 [IU] | Freq: Once | INTRAVENOUS | Status: AC | PRN
  Administered 2016-04-07: 500 [IU]

## 2016-04-07 MED ORDER — DIPHENHYDRAMINE HCL 50 MG/ML IJ SOLN
50.0000 mg | Freq: Once | INTRAMUSCULAR | Status: AC
  Administered 2016-04-07: 50 mg via INTRAVENOUS
  Filled 2016-04-07: qty 1

## 2016-04-07 MED ORDER — SODIUM CHLORIDE 0.9 % IV SOLN
Freq: Once | INTRAVENOUS | Status: AC
Start: 2016-04-07 — End: 2016-04-07
  Administered 2016-04-07: 09:00:00 via INTRAVENOUS

## 2016-04-07 NOTE — Progress Notes (Signed)
[facility] Psychosocial Distress Screening Clinical Social Work  Clinical Social Work was referred by distress screening protocol.  The patient scored a 5 on the Psychosocial Distress Thermometer which indicates moderate distress. Clinical Social Worker met with pt at length to assess for distress and other psychosocial needs. Pt lives in [place] and has to travel 23 miles one way for chemo and 42 miles one way for radiation. This has resulted in great financial hardship. Pt was on a very fixed income prior to her cancer diagnosis and now she cannot work her part-time job that allowed her and her husband to make ends meet. Pt reports to have one child, Sharyn Lull who is very helpful, but has her own family as well and a limited income. Pt currently does not have insurance and plans to apply for assistance/medicaid at [facility]. She reports to already have the needed paperwork to apply for these programs. CSW also reviewed additional assistance options for lung cancer patients to get help for gas. CSW assisted pt in applying for these today. Pt plans to contact Cancer care for additional help.   Pt reports to be experiencing common emotions related to her cancer diagnosis. CSW discussed these in detail and shared common coping techniques as well. Pt is very open to support through support groups and other activities through the center. She is also open to additional emotional support by checking in with CSW as needed. CSW to make referral to dietician for nutritional assistance as pt suffers from food insecurity and financial advocate as pt has no insurance currently. Pt reports her husband appears to have difficulty coping with her illness and has had issues with short-term memory. She feels this is impacting on his ability to provide support. CSW to follow and make referrals as appropriate.   [facility] DISTRESS SCREENING 04/07/2016  Screening Type Initial Screening  Distress experienced in past week (1-10) 5   Practical problem type Transportation;Food  Emotional problem type Nervousness/Anxiety;Adjusting to illness  Spiritual/Religous concerns type Facing my mortality  Physical Problem type Pain;Talking;Constipation/diarrhea  Physician notified of physical symptoms Yes  Referral to clinical social work Yes  Referral to dietition Yes  Referral to financial advocate Yes  Referral to support programs Yes  Other gas card application and referral to [place] for assistance    Clinical Social Worker follow up needed: Yes.    If yes, follow up plan: See above Loren Racer, Bulger Worker [place]  [facility] Phone: [phone] Fax: [phone]

## 2016-04-07 NOTE — Patient Instructions (Addendum)
[place] at [facility] Discharge Instructions  RECOMMENDATIONS MADE BY THE CONSULTANT AND ANY TEST RESULTS WILL BE SENT TO YOUR REFERRING PHYSICIAN.  Exam done and seen today by Dr. Whitney Muse Constipation sheet given, can also take Milk of magnesia 15-30cc at bedtime or every 4 hours. Social work consult today. Labs reviewed Return in one week for labs and chemo Please call the clinic if you have any questions or concerns  Thank you for choosing [place] at [facility] to provide your oncology and hematology care.  To afford each patient quality time with our provider, please arrive at least 15 minutes before your scheduled appointment time.   Beginning January 23rd 2017 lab work for the [facility] will be done in the  Main lab at [facility] on 1st floor. If you have a lab appointment with the [facility] please come in thru the  Main Entrance and check in at the main information desk  You need to re-schedule your appointment should you arrive 10 or more minutes late.  We strive to give you quality time with our providers, and arriving late affects you and other patients whose appointments are after yours.  Also, if you no show three or more times for appointments you may be dismissed from the clinic at the providers discretion.     Again, thank you for choosing [facility].  Our hope is that these requests will decrease the amount of time that you wait before being seen by our physicians.       _____________________________________________________________  Should you have questions after your visit to [facility], please contact our office at [phone] between the hours of 8:30 a.m. and 4:30 p.m.  Voicemails left after 4:30 p.m. will not be returned until the following business day.  For prescription refill requests, have your pharmacy contact our office.         Resources For Cancer Patients and their  Caregivers ? American Cancer Society: Can assist with transportation, wigs, general needs, runs Look Good Feel Better.        765 777 6791 ? Cancer Care: Provides financial assistance, online support groups, medication/co-pay assistance.  1-800-813-HOPE 567-877-6169) ? Faith Assists [place] cancer patients and their families through emotional , educational and financial support.  (321)359-7865 ? [place] DSS Where to apply for food stamps, Medicaid and utility assistance. 575-152-4843 ? [facility]: Transportation to medical appointments. (562) 161-0202 ? Social Security Administration: May apply for disability if have a Stage IV cancer. 603-145-0536 786-385-5739 ? [employer], Disability and Transit Services: Assists with nutrition, care and transit needs. [place] Support Programs: '@10RELATIVEDAYS'$ @ > Cancer Support Group  2nd Tuesday of the month 1pm-2pm, Journey Room  > Creative Journey  3rd Tuesday of the month 1130am-1pm, Journey Room  > Look Good Feel Better  1st Wednesday of the month 10am-12 noon, Journey Room (Call [place] to register [phone])

## 2016-04-07 NOTE — Progress Notes (Signed)
Tolerated chemo well. Ambulatory on discharge home with family.

## 2016-04-07 NOTE — Progress Notes (Signed)
[place] NOTE  Patient Care Team: [name], PA-C as PCP - General (Physician Assistant) Daneil Dolin, MD as Consulting Physician (Gastroenterology)  CHIEF COMPLAINTS/PURPOSE OF CONSULTATION:     Pancoast tumor of left lung [facility])   02/16/2016 - 02/20/2016 Hospital Admission Symptomatic anemia, lung mass   02/17/2016 Imaging CT chest with 9.6 x 6.1 x 6.6 cm macrolobulated mass in LUL with evidence of direct chest wall invasion, surrounding opacities ? early lymphangitic spread. No mediastinal or hilar LAD. pleural based lesion R apex prob cystic present 09/19/2013   02/18/2016 Procedure Colonoscopy with diverticulosis, five polyps   02/19/2016 Initial Biopsy Ct biopsy in IR   02/19/2016 Pathology Results poorly diff malignancy, unclear phenotype. carcinoma and melanoma are ruled out, calretinin positive staining may be seen in mesotheliomas in addition to other tumors   02/19/2016 Procedure EGD medium sized hiatal hernia, non obstructing schatzki ring, esophagitis   03/12/2016 PET scan Hypermetabolic LUL lung mass with direct chest wall invasion. Lower level hypermetabolism within the surrounding ground-glass opacity and septal thickening, most consistent with lympangitic spread. No thoracic adenopathy.    03/20/2016 Procedure Port placed   03/31/2016 -  Chemotherapy Weekly Carboplatin/Paclitaxel with XRT   03/31/2016 -  Radiation Therapy XRT in [place] with Dr. Tammi Klippel for 33 fractions over 6.5 weeks.    HISTORY OF PRESENTING ILLNESS:  Susan Mahoney 65 y.o. female is here for follow-up of pancoast tumor of the L left . She comes in to the [facility] today alone.  She notes that she has a hard time getting her husband to understand that his cancer treatment years ago was different than what she is going through. She also notes that his way of coping at times does not "help her."  She is having constipation. She was given a constipation sheet today.   Dr.  Tammi Klippel has just written for pain medication so she notes she does not need any more currently.   She still remains anemic. She has received one dose of feraheme and B12. This concerns her.  She has no other complaints today. No nausea or vomiting, no headaches. No other concerns today.   MEDICAL HISTORY:  Past Medical History  Diagnosis Date  . Hypertension   . Sinus infection   . Diabetes mellitus, type 2 ([place])   . Obesity   . Anemia   . Tobacco abuse   . Pancoast tumor of left lung ([place]) 03/03/2016  . Low vitamin B12 level 02/28/2016    SURGICAL HISTORY: Past Surgical History  Procedure Laterality Date  . Abdominal hysterectomy    . Esophagogastroduodenoscopy N/A 02/18/2016    Procedure: ESOPHAGOGASTRODUODENOSCOPY (EGD);  Surgeon: Daneil Dolin, MD;  Location: [facility] ENDO SUITE;  Service: Endoscopy;  Laterality: N/A;  . Colonoscopy N/A 02/18/2016    Procedure: COLONOSCOPY;  Surgeon: Daneil Dolin, MD;  Location: [facility] ENDO SUITE;  Service: Endoscopy;  Laterality: N/A;    SOCIAL HISTORY: Social History   Social History  . Marital Status: Married    Spouse Name: N/A  . Number of Children: N/A  . Years of Education: N/A   Occupational History  . Not on file.   Social History Main Topics  . Smoking status: Former Smoker -- 0.50 packs/day for 30 years    Types: Cigarettes    Quit date: 03/13/2016  . Smokeless tobacco: Never Used  . Alcohol Use: No  . Drug Use: No  . Sexual Activity: No  Other Topics Concern  . Not on file   Social History Narrative   Born in [place] Married 10 years in September of this year. 1 child, daughter. 2 grandchildren, both grandsons.  Smokes half a pack a day. Started smoking in her early 20's. Denies problems with alcohol.  Hobbies include reading, sewing, crafting.  FAMILY HISTORY: Family History  Problem Relation Age of Onset  . Cancer Father    Mother & father deceased. Mother was 49; died from natural causes. Father died of  colon cancer, was 25-59. 5 sisters. Oldest is diabetic. Baby sister is borderline diabetic. All of them have some form of heart disease. All on some form of blood pressure medicine. Mother also had high blood pressure; her mother had it too.  ALLERGIES:  is allergic to ace inhibitors and indomethacin.  MEDICATIONS:  Current Outpatient Prescriptions  Medication Sig Dispense Refill  . acetaminophen (TYLENOL) 500 MG tablet Take 1,000 mg by mouth every 6 (six) hours as needed for mild pain or moderate pain.     Marland Kitchen albuterol (PROVENTIL HFA;VENTOLIN HFA) 108 (90 BASE) MCG/ACT inhaler Inhale 1-2 puffs into the lungs every 6 (six) hours as needed for wheezing or shortness of breath. 1 Inhaler 0  . CARBOPLATIN IV Inject into the vein. Weekly with radiation    . Cetirizine HCl 10 MG CAPS Take 1 capsule (10 mg total) by mouth at bedtime. Restart after you've completed a three-day course of Benadryl.    . Cholecalciferol (VITAMIN D PO) Take 1 tablet by mouth daily. Reported on 03/31/2016    . ferrous gluconate (FERGON) 324 MG tablet Take 1 tablet (324 mg total) by mouth 2 (two) times daily with a meal. 60 tablet 3  . glipiZIDE (GLUCOTROL) 10 MG tablet Take 5 mg by mouth daily after supper.    Marland Kitchen HYDROcodone-acetaminophen (NORCO) 5-325 MG tablet Take 1-2 tablets by mouth every 4 (four) hours as needed for moderate pain. 90 tablet 0  . lidocaine-prilocaine (EMLA) cream Apply a quarter size amount to port site 1 hour prior to chemo. Do not rub in. Cover with plastic wrap. 30 g 3  . ondansetron (ZOFRAN) 8 MG tablet Take 1 tablet (8 mg total) by mouth every 8 (eight) hours as needed for nausea or vomiting. 30 tablet 2  . oxymetazoline (NASAL SPRAY 12 HOUR) 0.05 % nasal spray Place 1 spray into both nostrils daily as needed for congestion. Reported on 02/28/2016    . PACLitaxel (TAXOL) 300 MG/50ML injection Inject into the vein. Weekly with radiation    . pantoprazole (PROTONIX) 40 MG tablet Take 1 tablet (40 mg  total) by mouth 2 (two) times daily before a meal. 60 tablet 1  . prochlorperazine (COMPAZINE) 10 MG tablet Take 1 tablet (10 mg total) by mouth every 6 (six) hours as needed for nausea or vomiting. 30 tablet 2  . triamterene-hydrochlorothiazide (DYAZIDE) 37.5-25 MG per capsule Take 1 each (1 capsule total) by mouth daily.     No current facility-administered medications for this visit.   Facility-Administered Medications Ordered in Other Visits  Medication Dose Route Frequency Provider Last Rate Last Dose  . sodium chloride flush (NS) 0.9 % injection 10 mL  10 mL Intracatheter PRN Patrici Ranks, MD   10 mL at 04/07/16 0920    Review of Systems  Constitutional: Positive for weight loss. Negative for malaise/fatigue and diaphoresis.       Mild weight loss since February, ~8 pounds.  HENT: Positive for congestion. Negative for  hearing loss and tinnitus.   Eyes: Negative.  Negative for blurred vision, double vision, photophobia, pain, discharge and redness.  Respiratory: Positive for cough, hemoptysis and sputum production. Negative for shortness of breath and wheezing.        Cough and congestion in the morning, with some bloody sputum in the morning at times.  Cardiovascular: Negative.  Negative for chest pain, palpitations, orthopnea, claudication, leg swelling and PND.  Gastrointestinal: Negative.  Negative for heartburn, nausea, vomiting, abdominal pain, diarrhea, constipation, blood in stool and melena.       Decreased appetite, patient reports down to about 75%.  Genitourinary: Negative.  Negative for dysuria, urgency, frequency, hematuria and flank pain.  Musculoskeletal: Positive for joint pain. Negative for myalgias, back pain, falls and neck pain.       Pain in the L underarm. Chronic R knee pain.  Skin: Negative.  Negative for itching and rash.  Neurological: Negative.  Negative for dizziness, tingling, tremors, sensory change, speech change, focal weakness, seizures, loss of  consciousness, weakness and headaches.  Endo/Heme/Allergies: Negative.   Psychiatric/Behavioral: Negative.  Negative for depression, suicidal ideas, hallucinations, memory loss and substance abuse. The patient is not nervous/anxious and does not have insomnia.   All other systems reviewed and are negative.  14 point ROS was done and is otherwise as detailed above or in HPI   PHYSICAL EXAMINATION: ECOG PERFORMANCE STATUS: 1 - Symptomatic but completely ambulatory  Filed Vitals:   04/07/16 0811  BP: 122/58  Pulse: 97  Temp: 98.6 F (37 C)  Resp: 18   Filed Weights   04/07/16 0811  Weight: 172 lb (78.019 kg)    Physical Exam  Constitutional: She is oriented to person, place, and time and well-developed, well-nourished, and in no distress.  HENT:  Head: Normocephalic and atraumatic.  Nose: Nose normal.  Mouth/Throat: Oropharynx is clear and moist. No oropharyngeal exudate.  Eyes: Conjunctivae and EOM are normal. Pupils are equal, round, and reactive to light. Right eye exhibits no discharge. Left eye exhibits no discharge. No scleral icterus.  Neck: Normal range of motion. Neck supple. No tracheal deviation present. No thyromegaly present.  Cardiovascular: Normal rate, regular rhythm, normal heart sounds and intact distal pulses.  Exam reveals no gallop and no friction rub.   No murmur heard. Pulmonary/Chest: Effort normal and breath sounds normal. She has no wheezes. She has no rales.  Abdominal: Soft. Bowel sounds are normal. She exhibits no distension and no mass. There is no tenderness. There is no rebound and no guarding.  Musculoskeletal: Normal range of motion. She exhibits no edema.  Lymphadenopathy:    She has no cervical adenopathy.  Neurological: She is alert and oriented to person, place, and time. She has normal reflexes. No cranial nerve deficit. Gait normal. Coordination normal.  Skin: Skin is warm and dry. No rash noted.  Psychiatric: Mood, memory, affect and  judgment normal.  Nursing note and vitals reviewed.   LABORATORY DATA:  I have reviewed the data as listed Lab Results  Component Value Date   WBC 7.3 04/07/2016   HGB 8.3* 04/07/2016   HCT 27.1* 04/07/2016   MCV 68.1* 04/07/2016   PLT 324 04/07/2016   CMP     Component Value Date/Time   NA 133* 04/07/2016 0837   K 4.1 04/07/2016 0837   CL 103 04/07/2016 0837   CO2 25 04/07/2016 0837   GLUCOSE 267* 04/07/2016 0837   BUN 39* 04/07/2016 0837   CREATININE 1.52* 04/07/2016 1950  CALCIUM 9.0 04/07/2016 0837   PROT 6.8 04/07/2016 0837   ALBUMIN 3.1* 04/07/2016 0837   AST 15 04/07/2016 0837   ALT 15 04/07/2016 0837   ALKPHOS 93 04/07/2016 0837   BILITOT 0.3 04/07/2016 0837   GFRNONAA 35* 04/07/2016 0837   GFRAA 41* 04/07/2016 0837     RADIOGRAPHIC STUDIES: I have personally reviewed the radiological images as listed and agreed with the findings in the report. No results found.   CLINICAL DATA: Initial treatment strategy for left upper lobe lung mass with chest wall invasion. Right apical low-density cystic lesion.  EXAM: NUCLEAR MEDICINE PET SKULL BASE TO THIGH  TECHNIQUE: 8.4 mCi F-18 FDG was injected intravenously. Full-ring PET imaging was performed from the skull base to thigh after the radiotracer. CT data was obtained and used for attenuation correction and anatomic localization.  FASTING BLOOD GLUCOSE: Value: 129 mg/dl  COMPARISON: Chest CT of 02/17/2016.  FINDINGS: Mild degradation secondary to patient body habitus.  NECK  No areas of abnormal hypermetabolism.  CHEST  The well-circumscribed low-density right medial apical lesion corresponds to mild hypermetabolism. This measures 3.2 x 2.5 cm and a S.U.V. max of 5.8 on image 46/series 4.  A dominant left upper lobe lung mass with chest wall invasion corresponds to marked hypermetabolism. This measures a S.U.V. max of 47.6 SUV. The surrounding ground-glass opacity and septal  thickening demonstrates more low-level hypermetabolism, favoring lymphangitic tumor spread.  No thoracic nodal hypermetabolism.  ABDOMEN/PELVIS  No abnormal soft tissue hypermetabolism.  SKELETON  mildly heterogeneous marrow hypermetabolism throughout. This is favored to be artifactual and related to patient size. The most focal area is in the left acetabulum and measures a S.U.V. max of 4.6. This is without CT correlate.  CT IMAGES PERFORMED FOR ATTENUATION CORRECTION  Probable sebaceous cyst about the posterior right scalp on image 8/ series 4, 1.5 cm. No cervical adenopathy. Bilateral carotid atherosclerosis. Chest findings deferred to recent diagnostic chest CT. Aortic and branch vessel atherosclerosis. Normal adrenal glands. Interpolar left renal cyst. Prominent caudate lobe with hepatomegaly. Abdominal aortic atherosclerosis. Hysterectomy.  IMPRESSION: 1. Dominant hypermetabolic left upper lobe lung mass with direct chest wall invasion , consistent with primary bronchogenic carcinoma. Lower level hypermetabolism within the surrounding ground-glass opacity and septal thickening, most consistent with lymphangitic tumor spread. 2. No thoracic nodal hypermetabolism identified. 3. The right apical well-circumscribed low-density lesion demonstrates hypermetabolism and is only minimally enlarged compared to 2014. This remains suspicious for a nerve sheath tumor versus neuroenteric cyst. 4. Mild limitations secondary to patient body habitus. Heterogeneous marrow density which is favored to be artifactual and secondary. No convincing evidence of osseous metastasis. 5. Aortic atherosclerosis.   Electronically Signed  By: Abigail Miyamoto M.D.  On: 03/12/2016 12:31     PATHOLOGY:       ASSESSMENT & PLAN:  LUL pancoast tumor with chest wall invasion Symptomatic Anemia Iron deficiency  Low B12 Cancer related pain Concurrent  Chemo/XRT Hyperglycemia  Stage IIB poorly differentiated left pancoast tumor with direct invasion into the chest wall (T3N0M0), measuring 9.6 cm in largest dimension with PET scan on 03/12/2016 demonstrating direct chest wall invasion and findings suspicious for lymphangitic spread. Biopsy unable identify phenotype but poorly differentiated  She is currently tolerating concurrent therapy. Will proceed today.  Her anemia is persistent. No further evidence of iron deficiency. She has completed B12 X 4. Will continue B12 monthly. She remains microcytic. Will add further labs for workup next week. If necessary will transfuse until workup complete. She is  currently asymptomatic.  RTC 1 weeks with labs, PE and treatment.   MEDICATIONS PRESCRIBED THIS ENCOUNTER:  Meds ordered this encounter  Medications  . insulin aspart (novoLOG) injection 6 Units    Sig:    All questions were answered. The patient knows to call the clinic with any problems, questions or concerns.  This document serves as a record of services personally performed by Ancil Linsey, MD. It was created on her behalf by Toni Amend, a trained medical scribe. The creation of this record is based on the scribe's personal observations and the provider's statements to them. This document has been checked and approved by the attending provider.  I have reviewed the above documentation for accuracy and completeness and I agree with the above.  This note was electronically signed.    Molli Hazard, MD  04/07/2016 12:44 PM

## 2016-04-08 ENCOUNTER — Ambulatory Visit
Admission: RE | Admit: 2016-04-08 | Discharge: 2016-04-08 | Disposition: A | Discharge status: Home / Self Care | Payer: Medicaid Other | Source: Ambulatory Visit | Attending: Radiation Oncology | Admitting: Radiation Oncology

## 2016-04-08 DIAGNOSIS — Z510 Encounter for antineoplastic radiation therapy: Secondary | ICD-10-CM | POA: Diagnosis not present

## 2016-04-09 ENCOUNTER — Ambulatory Visit
Admission: RE | Admit: 2016-04-09 | Discharge: 2016-04-09 | Disposition: A | Discharge status: Home / Self Care | Payer: Medicaid Other | Source: Ambulatory Visit | Attending: Radiation Oncology | Admitting: Radiation Oncology

## 2016-04-09 DIAGNOSIS — Z510 Encounter for antineoplastic radiation therapy: Secondary | ICD-10-CM | POA: Diagnosis not present

## 2016-04-10 ENCOUNTER — Ambulatory Visit
Admission: RE | Admit: 2016-04-10 | Discharge: 2016-04-10 | Disposition: A | Discharge status: Home / Self Care | Payer: Self-pay | Source: Ambulatory Visit | Attending: Radiation Oncology | Admitting: Radiation Oncology

## 2016-04-10 ENCOUNTER — Ambulatory Visit
Admission: RE | Admit: 2016-04-10 | Discharge: 2016-04-10 | Disposition: A | Discharge status: Home / Self Care | Payer: Medicaid Other | Source: Ambulatory Visit | Attending: Radiation Oncology | Admitting: Radiation Oncology

## 2016-04-10 ENCOUNTER — Encounter: Payer: Self-pay | Admitting: Radiation Oncology

## 2016-04-10 VITALS — BP 99/66 | HR 109 | Resp 18 | Wt 168.2 lb

## 2016-04-10 DIAGNOSIS — C3412 Malignant neoplasm of upper lobe, left bronchus or lung: Secondary | ICD-10-CM

## 2016-04-10 DIAGNOSIS — Z510 Encounter for antineoplastic radiation therapy: Secondary | ICD-10-CM | POA: Diagnosis not present

## 2016-04-10 NOTE — Progress Notes (Signed)
  Radiation Oncology         [phone]   Name: Susan Mahoney MRN: [id number]   Date: 04/10/2016  DOB: May 11, 1951   Weekly Radiation Therapy Management      ICD-9-CM ICD-10-CM   1. Pancoast tumor of left lung ([facility]) 162.3 C34.12     Current Dose: 18 Gy  Planned Dose:  66 Gy  Narrative  Vitals stable. Weight loss noted. Reports a decreased appetite. Reports she was constipated until Tuesday when she took some over the counter medication. Denies any nausea or vomiting. Denies pain. Denies skin changes within treatment field. Reports using radiaplex as directed. Reports she is less winded than prior to starting treatment. Also, reports the frequency of her cough is less. Reports her cough is occasionally productive with beige sputum. Reports her sputum is occasional tinged with blood. Denies any pain or difficulty associated with swallowing. Reports moderate fatigue. Patient reports that she does not have much of an appetite.      The patient is without complaint. Set-up films were reviewed. The chart was checked.  Physical Findings  weight is 168 lb 3.2 oz (76.295 kg). Her blood pressure is 99/66 and her pulse is 109. Her respiration is 18 and oxygen saturation is 98%. . Weight essentially stable.  No significant changes.  Impression The patient is tolerating radiation.  Plan Continue treatment as planned. The patient was counseled by Dr. Tammi Klippel on trying to eat more.          Sheral Apley Tammi Klippel, M.D.  This document serves as a record of services personally performed by Tyler Pita, MD. It was created on his behalf by Truddie Hidden, a trained medical scribe. The creation of this record is based on the scribe's personal observations and the provider's statements to them. This document has been checked and approved by the attending provider.

## 2016-04-10 NOTE — Progress Notes (Signed)
Vitals stable. Weight loss noted. Reports a decreased appetite. Reports she was constipated until Tuesday when she took some over the counter medication. Denies any nausea or vomiting. Denies pain. Denies skin changes within treatment field. Reports using radiaplex as directed. Reports she is less winded than prior to starting treatment. Also, reports the frequency of her cough is less. Reports her cough is occasionally productive with beige sputum. Reports her sputum is occasional tinged with blood. Denies any pain or difficulty associated with swallowing. Reports moderate fatigue.   BP 99/66 mmHg  Pulse 109  Resp 18  Wt 168 lb 3.2 oz (76.295 kg)  SpO2 100% Wt Readings from Last 3 Encounters:  04/10/16 168 lb 3.2 oz (76.295 kg)  04/07/16 172 lb (78.019 kg)  04/02/16 172 lb (78.019 kg)

## 2016-04-13 ENCOUNTER — Ambulatory Visit
Admission: RE | Admit: 2016-04-13 | Discharge: 2016-04-13 | Disposition: A | Discharge status: Home / Self Care | Payer: Medicaid Other | Source: Ambulatory Visit | Attending: Radiation Oncology | Admitting: Radiation Oncology

## 2016-04-13 DIAGNOSIS — Z510 Encounter for antineoplastic radiation therapy: Secondary | ICD-10-CM | POA: Diagnosis not present

## 2016-04-14 ENCOUNTER — Encounter (HOSPITAL_COMMUNITY): Payer: Self-pay | Admitting: Hematology & Oncology

## 2016-04-14 ENCOUNTER — Encounter (HOSPITAL_COMMUNITY): Payer: Medicaid Other

## 2016-04-14 ENCOUNTER — Encounter (HOSPITAL_BASED_OUTPATIENT_CLINIC_OR_DEPARTMENT_OTHER): Payer: Medicaid Other

## 2016-04-14 ENCOUNTER — Ambulatory Visit
Admission: RE | Admit: 2016-04-14 | Discharge: 2016-04-14 | Disposition: A | Discharge status: Home / Self Care | Payer: Medicaid Other | Source: Ambulatory Visit | Attending: Radiation Oncology | Admitting: Radiation Oncology

## 2016-04-14 ENCOUNTER — Encounter (HOSPITAL_BASED_OUTPATIENT_CLINIC_OR_DEPARTMENT_OTHER): Payer: Medicaid Other | Admitting: Hematology & Oncology

## 2016-04-14 VITALS — BP 89/52 | HR 103 | Temp 98.4°F | Resp 16 | Wt 169.5 lb

## 2016-04-14 VITALS — BP 97/41 | HR 88 | Temp 98.4°F | Resp 18

## 2016-04-14 DIAGNOSIS — D649 Anemia, unspecified: Secondary | ICD-10-CM

## 2016-04-14 DIAGNOSIS — Z5111 Encounter for antineoplastic chemotherapy: Secondary | ICD-10-CM

## 2016-04-14 DIAGNOSIS — R7989 Other specified abnormal findings of blood chemistry: Secondary | ICD-10-CM

## 2016-04-14 DIAGNOSIS — C3412 Malignant neoplasm of upper lobe, left bronchus or lung: Secondary | ICD-10-CM | POA: Diagnosis not present

## 2016-04-14 DIAGNOSIS — I952 Hypotension due to drugs: Secondary | ICD-10-CM

## 2016-04-14 DIAGNOSIS — F418 Other specified anxiety disorders: Secondary | ICD-10-CM

## 2016-04-14 DIAGNOSIS — E538 Deficiency of other specified B group vitamins: Secondary | ICD-10-CM

## 2016-04-14 DIAGNOSIS — R4589 Other symptoms and signs involving emotional state: Secondary | ICD-10-CM

## 2016-04-14 DIAGNOSIS — D509 Iron deficiency anemia, unspecified: Secondary | ICD-10-CM

## 2016-04-14 DIAGNOSIS — Z510 Encounter for antineoplastic radiation therapy: Secondary | ICD-10-CM | POA: Diagnosis not present

## 2016-04-14 DIAGNOSIS — G893 Neoplasm related pain (acute) (chronic): Secondary | ICD-10-CM

## 2016-04-14 LAB — COMPREHENSIVE METABOLIC PANEL
ALBUMIN: 3.4 g/dL — AB (ref 3.5–5.0)
ALT: 22 U/L (ref 14–54)
ANION GAP: 5 (ref 5–15)
AST: 22 U/L (ref 15–41)
Alkaline Phosphatase: 78 U/L (ref 38–126)
BUN: 40 mg/dL — ABNORMAL HIGH (ref 6–20)
CALCIUM: 9.2 mg/dL (ref 8.9–10.3)
CHLORIDE: 104 mmol/L (ref 101–111)
CO2: 24 mmol/L (ref 22–32)
Creatinine, Ser: 1.67 mg/dL — ABNORMAL HIGH (ref 0.44–1.00)
GFR calc non Af Amer: 32 mL/min — ABNORMAL LOW (ref >60)
GFR, EST AFRICAN AMERICAN: 37 mL/min — AB (ref >60)
Glucose, Bld: 213 mg/dL — ABNORMAL HIGH (ref 65–99)
POTASSIUM: 3.9 mmol/L (ref 3.5–5.1)
SODIUM: 133 mmol/L — AB (ref 135–145)
Total Bilirubin: 0.3 mg/dL (ref 0.3–1.2)
Total Protein: 7 g/dL (ref 6.5–8.1)

## 2016-04-14 LAB — CBC WITH DIFFERENTIAL/PLATELET
BASOS PCT: 0 %
Basophils Absolute: 0 10*3/uL (ref 0.0–0.1)
EOS ABS: 0.1 10*3/uL (ref 0.0–0.7)
EOS PCT: 2 %
HCT: 28.6 % — ABNORMAL LOW (ref 36.0–46.0)
Hemoglobin: 8.8 g/dL — ABNORMAL LOW (ref 12.0–15.0)
LYMPHS ABS: 0.8 10*3/uL (ref 0.7–4.0)
Lymphocytes Relative: 12 %
MCH: 21.2 pg — AB (ref 26.0–34.0)
MCHC: 30.8 g/dL (ref 30.0–36.0)
MCV: 68.9 fL — ABNORMAL LOW (ref 78.0–100.0)
MONOS PCT: 8 %
Monocytes Absolute: 0.6 10*3/uL (ref 0.1–1.0)
Neutro Abs: 5.3 10*3/uL (ref 1.7–7.7)
Neutrophils Relative %: 78 %
PLATELETS: 250 10*3/uL (ref 150–400)
RBC: 4.15 MIL/uL (ref 3.87–5.11)
RDW: 23.6 % — AB (ref 11.5–15.5)
WBC: 6.8 10*3/uL (ref 4.0–10.5)

## 2016-04-14 LAB — PREPARE RBC (CROSSMATCH)

## 2016-04-14 MED ORDER — PALONOSETRON HCL INJECTION 0.25 MG/5ML
INTRAVENOUS | Status: AC
  Filled 2016-04-14: qty 5

## 2016-04-14 MED ORDER — DIPHENHYDRAMINE HCL 50 MG/ML IJ SOLN
INTRAMUSCULAR | Status: AC
  Filled 2016-04-14: qty 1

## 2016-04-14 MED ORDER — SODIUM CHLORIDE 0.9 % IV SOLN
20.0000 mg | Freq: Once | INTRAVENOUS | Status: AC
  Administered 2016-04-14: 20 mg via INTRAVENOUS
  Filled 2016-04-14: qty 2

## 2016-04-14 MED ORDER — SODIUM CHLORIDE 0.9 % IV SOLN
136.2000 mg | Freq: Once | INTRAVENOUS | Status: AC
  Administered 2016-04-14: 140 mg via INTRAVENOUS
  Filled 2016-04-14: qty 14

## 2016-04-14 MED ORDER — SODIUM CHLORIDE 0.9% FLUSH
10.0000 mL | INTRAVENOUS | Status: DC | PRN
  Administered 2016-04-14: 10 mL
  Filled 2016-04-14: qty 10

## 2016-04-14 MED ORDER — SODIUM CHLORIDE 0.9 % IV SOLN
Freq: Once | INTRAVENOUS | Status: AC
  Administered 2016-04-14: 10:00:00 via INTRAVENOUS

## 2016-04-14 MED ORDER — FAMOTIDINE IN NACL 20-0.9 MG/50ML-% IV SOLN
INTRAVENOUS | Status: AC
  Filled 2016-04-14: qty 50

## 2016-04-14 MED ORDER — HEPARIN SOD (PORK) LOCK FLUSH 100 UNIT/ML IV SOLN
INTRAVENOUS | Status: AC
  Filled 2016-04-14: qty 5

## 2016-04-14 MED ORDER — FAMOTIDINE IN NACL 20-0.9 MG/50ML-% IV SOLN
20.0000 mg | Freq: Once | INTRAVENOUS | Status: AC
  Administered 2016-04-14: 20 mg via INTRAVENOUS

## 2016-04-14 MED ORDER — HEPARIN SOD (PORK) LOCK FLUSH 100 UNIT/ML IV SOLN
500.0000 [IU] | Freq: Once | INTRAVENOUS | Status: AC | PRN
  Administered 2016-04-14: 500 [IU]

## 2016-04-14 MED ORDER — DIPHENHYDRAMINE HCL 50 MG/ML IJ SOLN
50.0000 mg | Freq: Once | INTRAMUSCULAR | Status: AC
  Administered 2016-04-14: 50 mg via INTRAVENOUS

## 2016-04-14 MED ORDER — PALONOSETRON HCL INJECTION 0.25 MG/5ML
0.2500 mg | Freq: Once | INTRAVENOUS | Status: AC
  Administered 2016-04-14: 0.25 mg via INTRAVENOUS

## 2016-04-14 MED ORDER — PACLITAXEL CHEMO INJECTION 300 MG/50ML
45.0000 mg/m2 | Freq: Once | INTRAVENOUS | Status: AC
  Administered 2016-04-14: 78 mg via INTRAVENOUS
  Filled 2016-04-14: qty 13

## 2016-04-14 NOTE — Progress Notes (Signed)
[facility]  Progress Note  Patient Care Team: [name], PA-C as PCP - General (Physician Assistant) Daneil Dolin, MD as Consulting Physician (Gastroenterology)  CHIEF COMPLAINTS:     Pancoast tumor of left lung [facility])   02/16/2016 - 02/20/2016 Hospital Admission    Symptomatic anemia, lung mass     02/17/2016 Imaging    CT chest with 9.6 x 6.1 x 6.6 cm macrolobulated mass in LUL with evidence of direct chest wall invasion, surrounding opacities ? early lymphangitic spread. No mediastinal or hilar LAD. pleural based lesion R apex prob cystic present 09/19/2013     02/18/2016 Procedure    Colonoscopy with diverticulosis, five polyps     02/19/2016 Initial Biopsy    Ct biopsy in IR     02/19/2016 Pathology Results    poorly diff malignancy, unclear phenotype. carcinoma and melanoma are ruled out, calretinin positive staining may be seen in mesotheliomas in addition to other tumors     02/19/2016 Procedure    EGD medium sized hiatal hernia, non obstructing schatzki ring, esophagitis     03/12/2016 PET scan    Hypermetabolic LUL lung mass with direct chest wall invasion. Lower level hypermetabolism within the surrounding ground-glass opacity and septal thickening, most consistent with lympangitic spread. No thoracic adenopathy.      03/20/2016 Procedure    Port placed     03/31/2016 -  Chemotherapy    Weekly Carboplatin/Paclitaxel with XRT     03/31/2016 -  Radiation Therapy    XRT in [place] with Dr. Tammi Klippel for 33 fractions over 6.5 weeks.      HISTORY OF PRESENTING ILLNESS:  Susan Mahoney 65 y.o. female is here for follow-up of pancoast tumor of the L left .   Mrs. Reigel is accompanied by her husband and daughter. She is here for Cycle #3 of weekly Carboplatin / Paclitaxel. I personally reviewed and went over patient's labs and vitals  today.  Patient reports weakness and fatigue. She has been eating well.   Her left underarm pain has improved and she has  not needed her pain medication as much.   Reports she had nausea last Tuesday evening and "upchucked" about 4 times then she was fine. No nausea or vomiting since.  She did have some issue with constipation last week but she has had normal stool for the past few days, moving her bowels once daily.   Notes she wakes up between 3 and 4 am not necessarily due to pain, but because of her thoughts. States she has a good support system and she tries to put her mind in a restful place. Her daughter notes her mother does not smile as often because "She is a busy bee momma and she can not be a busy bee momma". Her daughter believes she has a bit of cabin fever as well since she has not been able to get out with the heat. She used to enjoy reading books and crafting but she has not felt like it lately. Her daughter describes her mother as being "in a slump".   States her blood sugar levels have been alright.   She has not developed any skin issues with radiation so far. She has been using lotion regularly. She admits that her mood is down but is not interested in an antidepressant at this point.  MEDICAL HISTORY:  Past Medical History:  Diagnosis Date  . Anemia   . Diabetes mellitus, type 2 ([place])   .  Hypertension   . Low vitamin B12 level 02/28/2016  . Obesity   . Pancoast tumor of left lung ([place]) 03/03/2016  . Sinus infection   . Tobacco abuse     SURGICAL HISTORY: Past Surgical History:  Procedure Laterality Date  . ABDOMINAL HYSTERECTOMY    . COLONOSCOPY N/A 02/18/2016   Procedure: COLONOSCOPY;  Surgeon: Daneil Dolin, MD;  Location: [facility];  Service: Endoscopy;  Laterality: N/A;  . ESOPHAGOGASTRODUODENOSCOPY N/A 02/18/2016   Procedure: ESOPHAGOGASTRODUODENOSCOPY (EGD);  Surgeon: Daneil Dolin, MD;  Location: [facility];  Service: Endoscopy;  Laterality: N/A;    SOCIAL HISTORY: Social History   Social History  . Marital status: Married    Spouse name: N/A  . Number of  children: N/A  . Years of education: N/A   Occupational History  . Not on file.   Social History Main Topics  . Smoking status: Former Smoker    Packs/day: 0.50    Years: 30.00    Types: Cigarettes    Quit date: 03/13/2016  . Smokeless tobacco: Never Used  . Alcohol use No  . Drug use: No  . Sexual activity: No   Other Topics Concern  . Not on file   Social History Narrative  . No narrative on file   Born in [place] Married 43 years in September of this year. 1 child, daughter. 2 grandchildren, both grandsons.  Smokes half a pack a day. Started smoking in her early 20's. Denies problems with alcohol.  Hobbies include reading, sewing, crafting.  FAMILY HISTORY: Family History  Problem Relation Age of Onset  . Cancer Father    Mother & father deceased. Mother was 39; died from natural causes. Father died of colon cancer, was 22-59. 5 sisters. Oldest is diabetic. Baby sister is borderline diabetic. All of them have some form of heart disease. All on some form of blood pressure medicine. Mother also had high blood pressure; her mother had it too.  ALLERGIES:  is allergic to ace inhibitors and indomethacin.  MEDICATIONS:  Current Outpatient Prescriptions  Medication Sig Dispense Refill  . acetaminophen (TYLENOL) 500 MG tablet Take 1,000 mg by mouth every 6 (six) hours as needed for mild pain or moderate pain.     Marland Kitchen albuterol (PROVENTIL HFA;VENTOLIN HFA) 108 (90 BASE) MCG/ACT inhaler Inhale 1-2 puffs into the lungs every 6 (six) hours as needed for wheezing or shortness of breath. 1 Inhaler 0  . CARBOPLATIN IV Inject into the vein. Weekly with radiation    . Cetirizine HCl 10 MG CAPS Take 1 capsule (10 mg total) by mouth at bedtime. Restart after you've completed a three-day course of Benadryl.    . Cholecalciferol (VITAMIN D PO) Take 1 tablet by mouth daily. Reported on 03/31/2016    . ferrous gluconate (FERGON) 324 MG tablet Take 1 tablet (324 mg total) by mouth 2  (two) times daily with a meal. 60 tablet 3  . glipiZIDE (GLUCOTROL) 10 MG tablet Take 5 mg by mouth daily after supper.    Marland Kitchen HYDROcodone-acetaminophen (NORCO) 5-325 MG tablet Take 1-2 tablets by mouth every 4 (four) hours as needed for moderate pain. 90 tablet 0  . lidocaine-prilocaine (EMLA) cream Apply a quarter size amount to port site 1 hour prior to chemo. Do not rub in. Cover with plastic wrap. 30 g 3  . ondansetron (ZOFRAN) 8 MG tablet Take 1 tablet (8 mg total) by mouth every 8 (eight) hours as needed for nausea or vomiting. [place]  tablet 2  . oxymetazoline (NASAL SPRAY 12 HOUR) 0.05 % nasal spray Place 1 spray into both nostrils daily as needed for congestion. Reported on 02/28/2016    . PACLitaxel (TAXOL) 300 MG/50ML injection Inject into the vein. Weekly with radiation    . pantoprazole (PROTONIX) 40 MG tablet Take 1 tablet (40 mg total) by mouth 2 (two) times daily before a meal. 60 tablet 1  . prochlorperazine (COMPAZINE) 10 MG tablet Take 1 tablet (10 mg total) by mouth every 6 (six) hours as needed for nausea or vomiting. 30 tablet 2  . triamterene-hydrochlorothiazide (DYAZIDE) 37.5-25 MG per capsule Take 1 each (1 capsule total) by mouth daily.     No current facility-administered medications for this visit.     Review of Systems  Constitutional: Positive for malaise/fatigue. Negative for diaphoresis.  HENT: Negative for hearing loss and tinnitus.   Eyes: Negative.  Negative for blurred vision, double vision, photophobia, pain, discharge and redness.  Respiratory: Negative for shortness of breath and wheezing.   Cardiovascular: Negative.  Negative for chest pain, palpitations, orthopnea, claudication, leg swelling and PND.  Gastrointestinal: Positive for nausea and vomiting. Negative for abdominal pain, blood in stool, constipation, diarrhea, heartburn and melena.       Nausea and vomiting last Tuesday.  Diarrhea last week, since resolved.  Genitourinary: Negative.  Negative for  dysuria, flank pain, frequency, hematuria and urgency.  Musculoskeletal: Positive for joint pain. Negative for back pain, falls, myalgias and neck pain.       Chronic R knee pain. L underarm pain  Skin: Negative.  Negative for itching and rash.  Neurological: Positive for weakness. Negative for dizziness, tingling, tremors, sensory change, speech change, focal weakness, seizures, loss of consciousness and headaches.  Endo/Heme/Allergies: Negative.   Psychiatric/Behavioral: Negative for depression, hallucinations, memory loss, substance abuse and suicidal ideas. The patient has insomnia. The patient is not nervous/anxious.   All other systems reviewed and are negative.  14 point ROS was done and is otherwise as detailed above or in HPI   PHYSICAL EXAMINATION: ECOG PERFORMANCE STATUS: 1 - Symptomatic but completely ambulatory  Vitals:   04/14/16 0800  BP: (!) 89/52  Pulse: (!) 103  Resp: 16  Temp: 98.4 F (36.9 C)   Filed Weights   04/14/16 0800  Weight: 169 lb 8 oz (76.9 kg)    Physical Exam  Constitutional: She is oriented to person, place, and time and well-developed, well-nourished, and in no distress.  Obese  HENT:  Head: Normocephalic and atraumatic.  Nose: Nose normal.  Mouth/Throat: Oropharynx is clear and moist. No oropharyngeal exudate.  Eyes: Conjunctivae and EOM are normal. Pupils are equal, round, and reactive to light. Right eye exhibits no discharge. Left eye exhibits no discharge. No scleral icterus.  Neck: Normal range of motion. Neck supple. No tracheal deviation present. No thyromegaly present.  Cardiovascular: Normal rate, regular rhythm, normal heart sounds and intact distal pulses.  Exam reveals no gallop and no friction rub.   No murmur heard. Pulmonary/Chest: Effort normal and breath sounds normal. She has no wheezes. She has no rales.  Abdominal: Soft. Bowel sounds are normal. She exhibits no distension and no mass. There is no tenderness. There is no  rebound and no guarding.  Musculoskeletal: Normal range of motion. She exhibits no edema.  Lymphadenopathy:    She has no cervical adenopathy.  Neurological: She is alert and oriented to person, place, and time. She has normal reflexes. No cranial nerve deficit. Gait normal. Coordination  normal.  Skin: Skin is warm and dry. No rash noted.  Psychiatric: Mood, memory, affect and judgment normal.  Nursing note and vitals reviewed.   LABORATORY DATA:  I have reviewed the data as listed Lab Results  Component Value Date   WBC 7.3 04/07/2016   HGB 8.3 (L) 04/07/2016   HCT 27.1 (L) 04/07/2016   MCV 68.1 (L) 04/07/2016   PLT 324 04/07/2016   CMP     Component Value Date/Time   NA 133 (L) 04/07/2016 0837   K 4.1 04/07/2016 0837   CL 103 04/07/2016 0837   CO2 25 04/07/2016 0837   GLUCOSE 267 (H) 04/07/2016 0837   BUN 39 (H) 04/07/2016 0837   CREATININE 1.52 (H) 04/07/2016 0837   CALCIUM 9.0 04/07/2016 0837   PROT 6.8 04/07/2016 0837   ALBUMIN 3.1 (L) 04/07/2016 0837   AST 15 04/07/2016 0837   ALT 15 04/07/2016 0837   ALKPHOS 93 04/07/2016 0837   BILITOT 0.3 04/07/2016 0837   GFRNONAA 35 (L) 04/07/2016 0837   GFRAA 41 (L) 04/07/2016 4696     RADIOGRAPHIC STUDIES: I have personally reviewed the radiological images as listed and agreed with the findings in the report. No results found.   CLINICAL DATA: Initial treatment strategy for left upper lobe lung mass with chest wall invasion. Right apical low-density cystic lesion.  EXAM: NUCLEAR MEDICINE PET SKULL BASE TO THIGH  TECHNIQUE: 8.4 mCi F-18 FDG was injected intravenously. Full-ring PET imaging was performed from the skull base to thigh after the radiotracer. CT data was obtained and used for attenuation correction and anatomic localization.  FASTING BLOOD GLUCOSE: Value: 129 mg/dl  COMPARISON: Chest CT of 02/17/2016.  FINDINGS: Mild degradation secondary to patient body habitus.  NECK  No areas of  abnormal hypermetabolism.  CHEST  The well-circumscribed low-density right medial apical lesion corresponds to mild hypermetabolism. This measures 3.2 x 2.5 cm and a S.U.V. max of 5.8 on image 46/series 4.  A dominant left upper lobe lung mass with chest wall invasion corresponds to marked hypermetabolism. This measures a S.U.V. max of 47.6 SUV. The surrounding ground-glass opacity and septal thickening demonstrates more low-level hypermetabolism, favoring lymphangitic tumor spread.  No thoracic nodal hypermetabolism.  ABDOMEN/PELVIS  No abnormal soft tissue hypermetabolism.  SKELETON  mildly heterogeneous marrow hypermetabolism throughout. This is favored to be artifactual and related to patient size. The most focal area is in the left acetabulum and measures a S.U.V. max of 4.6. This is without CT correlate.  CT IMAGES PERFORMED FOR ATTENUATION CORRECTION  Probable sebaceous cyst about the posterior right scalp on image 8/ series 4, 1.5 cm. No cervical adenopathy. Bilateral carotid atherosclerosis. Chest findings deferred to recent diagnostic chest CT. Aortic and branch vessel atherosclerosis. Normal adrenal glands. Interpolar left renal cyst. Prominent caudate lobe with hepatomegaly. Abdominal aortic atherosclerosis. Hysterectomy.  IMPRESSION: 1. Dominant hypermetabolic left upper lobe lung mass with direct chest wall invasion , consistent with primary bronchogenic carcinoma. Lower level hypermetabolism within the surrounding ground-glass opacity and septal thickening, most consistent with lymphangitic tumor spread. 2. No thoracic nodal hypermetabolism identified. 3. The right apical well-circumscribed low-density lesion demonstrates hypermetabolism and is only minimally enlarged compared to 2014. This remains suspicious for a nerve sheath tumor versus neuroenteric cyst. 4. Mild limitations secondary to patient body habitus. Heterogeneous marrow density  which is favored to be artifactual and secondary. No convincing evidence of osseous metastasis. 5. Aortic atherosclerosis.   Electronically Signed  By: Abigail Miyamoto M.D.  On: 03/12/2016  12:31     PATHOLOGY:       ASSESSMENT & PLAN:  LUL pancoast tumor with chest wall invasion Symptomatic Anemia Iron deficiency  Low B12 Cancer related pain Concurrent Chemo/XRT Hyperglycemia Anxiety about health Hypotension due to drug  Stage IIB poorly differentiated left pancoast tumor with direct invasion into the chest wall (T3N0M0), measuring 9.6 cm in largest dimension with PET scan on 03/12/2016 demonstrating direct chest wall invasion and findings suspicious for lymphangitic spread. Biopsy unable identify phenotype but poorly differentiated  She is currently tolerating concurrent therapy. Will proceed today.  Her anemia is persistent. No further evidence of iron deficiency. She has completed B12 X 4. Will continue B12 monthly. She remains microcytic. Will add further labs for workup next week. If necessary will transfuse until workup complete. She is currently asymptomatic.  She is hypotensive. I have asked her to hold her BP medication.   We discussed her mood, she admits to being "down" however she is not interested in an antidepressant or support group.   Will consult nutrition. She has been given samples of boost.   RTC 1 weeks with labs, PE and treatment.  All questions were answered. The patient knows to call the clinic with any problems, questions or concerns.  This document serves as a record of services personally performed by Ancil Linsey, MD. It was created on her behalf by Arlyce Harman, a trained medical scribe. The creation of this record is based on the scribe's personal observations and the provider's statements to them. This document has been checked and approved by the attending provider.  I have reviewed the above documentation for accuracy and  completeness and I agree with the above.  This note was electronically signed.    Molli Hazard, MD  04/14/2016 8:47 AM

## 2016-04-14 NOTE — Progress Notes (Signed)
See infusion encounter.  

## 2016-04-14 NOTE — Progress Notes (Signed)
Tolerated chemo well. Ambulatory on discharge home with husband. Patient has blue blood band bracelet on. She knows she is not to take bracelet off and to come back Thursday to Short Stay for blood transfusion.

## 2016-04-14 NOTE — Patient Instructions (Signed)
[place] at [facility] Discharge Instructions  RECOMMENDATIONS MADE BY THE CONSULTANT AND ANY TEST RESULTS WILL BE SENT TO YOUR REFERRING PHYSICIAN.  You were seen by Dr. Whitney Muse today. Return to Center in 1 week for chemo and labs 2 units of Blood tomorrow Hold tnamterene HCT 2 Consult Dietician Chocolate Ensure Boost samples Please call the center with any concerns.  Thank you for choosing [place] at [facility] to provide your oncology and hematology care.  To afford each patient quality time with our provider, please arrive at least 15 minutes before your scheduled appointment time.   Beginning January 23rd 2017 lab work for the [facility] will be done in the  Main lab at [facility] on 1st floor. If you have a lab appointment with the [facility] please come in thru the  Main Entrance and check in at the main information desk  You need to re-schedule your appointment should you arrive 10 or more minutes late.  We strive to give you quality time with our providers, and arriving late affects you and other patients whose appointments are after yours.  Also, if you no show three or more times for appointments you may be dismissed from the clinic at the providers discretion.     Again, thank you for choosing [facility].  Our hope is that these requests will decrease the amount of time that you wait before being seen by our physicians.       _____________________________________________________________  Should you have questions after your visit to [facility], please contact our office at [phone] between the hours of 8:30 a.m. and 4:30 p.m.  Voicemails left after 4:30 p.m. will not be returned until the following business day.  For prescription refill requests, have your pharmacy contact our office.         Resources For Cancer Patients and their Caregivers ? American Cancer Society: Can assist  with transportation, wigs, general needs, runs Look Good Feel Better.        (419) 496-1333 ? Cancer Care: Provides financial assistance, online support groups, medication/co-pay assistance.  1-800-813-HOPE 9843212962) ? [facility] Assists [place] cancer patients and their families through emotional , educational and financial support.  860-348-1505 ? [place] DSS Where to apply for food stamps, Medicaid and utility assistance. 343-639-9716 ? RCATS: Transportation to medical appointments. 617-762-6193 ? Social Security Administration: May apply for disability if have a Stage IV cancer. 639-642-7822 548-290-8853 ? [employer], Disability and Transit Services: Assists with nutrition, care and transit needs. [place] Support Programs: '@10RELATIVEDAYS'$ @ > Cancer Support Group  2nd Tuesday of the month 1pm-2pm, Journey Room  > Creative Journey  3rd Tuesday of the month 1130am-1pm, Journey Room  > Look Good Feel Better  1st Wednesday of the month 10am-12 noon, Journey Room (Call [place] to register [phone])

## 2016-04-14 NOTE — Patient Instructions (Signed)
[facility] Discharge Instructions for Patients Receiving Chemotherapy   Beginning January 23rd 2017 lab work for the [facility] will be done in the  Main lab at [facility] on 1st floor. If you have a lab appointment with the [place] please come in thru the  Main Entrance and check in at the main information desk   Today you received the following chemotherapy agents Taxol and Carbo week 3.  Blood transfusion on Thursday in Short Stay.  To help prevent nausea and vomiting after your treatment, we encourage you to take your nausea medication as instructed.   If you develop nausea and vomiting, or diarrhea that is not controlled by your medication, call the clinic.  The clinic phone number is [phone]. Office hours are Monday-Friday 8:30am-5:00pm.  BELOW ARE SYMPTOMS THAT SHOULD BE REPORTED IMMEDIATELY:  *FEVER GREATER THAN 101.0 F  *CHILLS WITH OR WITHOUT FEVER  NAUSEA AND VOMITING THAT IS NOT CONTROLLED WITH YOUR NAUSEA MEDICATION  *UNUSUAL SHORTNESS OF BREATH  *UNUSUAL BRUISING OR BLEEDING  TENDERNESS IN MOUTH AND THROAT WITH OR WITHOUT PRESENCE OF ULCERS  *URINARY PROBLEMS  *BOWEL PROBLEMS  UNUSUAL RASH Items with * indicate a potential emergency and should be followed up as soon as possible. If you have an emergency after office hours please contact your primary care physician or go to the nearest emergency department.  Please call the clinic during office hours if you have any questions or concerns.   You may also contact the Patient Navigator at [phone] should you have any questions or need assistance in obtaining follow up care.      Resources For Cancer Patients and their Caregivers ? American Cancer Society: Can assist with transportation, wigs, general needs, runs Look Good Feel Better.        4131185780 ? Cancer Care: Provides financial assistance, online support groups, medication/co-pay assistance.   1-800-813-HOPE (417) 024-5225) ? [facility] Assists [place] cancer patients and their families through emotional , educational and financial support.  (812)453-4530 ? [place] DSS Where to apply for food stamps, Medicaid and utility assistance. (908) 710-1216 ? RCATS: Transportation to medical appointments. 507-372-2840 ? Social Security Administration: May apply for disability if have a Stage IV cancer. 385-468-9243 684-287-4158 ? [employer], Disability and Transit Services: Assists with nutrition, care and transit needs. 4063928431

## 2016-04-15 ENCOUNTER — Ambulatory Visit
Admission: RE | Admit: 2016-04-15 | Discharge: 2016-04-15 | Disposition: A | Discharge status: Home / Self Care | Payer: Medicaid Other | Source: Ambulatory Visit | Attending: Radiation Oncology | Admitting: Radiation Oncology

## 2016-04-15 DIAGNOSIS — Z510 Encounter for antineoplastic radiation therapy: Secondary | ICD-10-CM | POA: Diagnosis not present

## 2016-04-16 ENCOUNTER — Ambulatory Visit
Admission: RE | Admit: 2016-04-16 | Discharge: 2016-04-16 | Disposition: A | Discharge status: Home / Self Care | Payer: Medicaid Other | Source: Ambulatory Visit | Attending: Radiation Oncology | Admitting: Radiation Oncology

## 2016-04-16 ENCOUNTER — Encounter (HOSPITAL_COMMUNITY)
Admission: RE | Admit: 2016-04-16 | Discharge: 2016-04-16 | Disposition: A | Discharge status: Home / Self Care | Payer: Medicaid Other | Source: Ambulatory Visit | Attending: Hematology & Oncology | Admitting: Hematology & Oncology

## 2016-04-16 DIAGNOSIS — C3412 Malignant neoplasm of upper lobe, left bronchus or lung: Secondary | ICD-10-CM | POA: Diagnosis present

## 2016-04-16 DIAGNOSIS — D649 Anemia, unspecified: Secondary | ICD-10-CM | POA: Diagnosis present

## 2016-04-16 DIAGNOSIS — Z510 Encounter for antineoplastic radiation therapy: Secondary | ICD-10-CM | POA: Diagnosis not present

## 2016-04-16 MED ORDER — SODIUM CHLORIDE 0.9 % IV SOLN
250.0000 mL | Freq: Once | INTRAVENOUS | Status: AC
  Administered 2016-04-16: 250 mL via INTRAVENOUS

## 2016-04-16 MED ORDER — SODIUM CHLORIDE 0.9% FLUSH
10.0000 mL | INTRAVENOUS | Status: AC | PRN
  Administered 2016-04-16: 10 mL

## 2016-04-16 MED ORDER — DIPHENHYDRAMINE HCL 25 MG PO CAPS
25.0000 mg | ORAL_CAPSULE | Freq: Once | ORAL | Status: AC
  Administered 2016-04-16: 25 mg via ORAL
  Filled 2016-04-16: qty 1

## 2016-04-16 MED ORDER — HEPARIN SOD (PORK) LOCK FLUSH 100 UNIT/ML IV SOLN
500.0000 [IU] | Freq: Every day | INTRAVENOUS | Status: AC | PRN
  Administered 2016-04-16: 500 [IU]
  Filled 2016-04-16: qty 5

## 2016-04-16 MED ORDER — ACETAMINOPHEN 325 MG PO TABS
650.0000 mg | ORAL_TABLET | Freq: Once | ORAL | Status: AC
  Administered 2016-04-16: 650 mg via ORAL
  Filled 2016-04-16: qty 2

## 2016-04-17 ENCOUNTER — Ambulatory Visit
Admission: RE | Admit: 2016-04-17 | Discharge: 2016-04-17 | Disposition: A | Discharge status: Home / Self Care | Payer: Medicaid Other | Source: Ambulatory Visit | Attending: Radiation Oncology | Admitting: Radiation Oncology

## 2016-04-17 ENCOUNTER — Ambulatory Visit
Admission: RE | Admit: 2016-04-17 | Discharge: 2016-04-17 | Disposition: A | Discharge status: Home / Self Care | Payer: Self-pay | Source: Ambulatory Visit | Attending: Radiation Oncology | Admitting: Radiation Oncology

## 2016-04-17 VITALS — BP 107/64 | HR 86 | Resp 16 | Wt 171.3 lb

## 2016-04-17 DIAGNOSIS — C3412 Malignant neoplasm of upper lobe, left bronchus or lung: Secondary | ICD-10-CM

## 2016-04-17 DIAGNOSIS — Z510 Encounter for antineoplastic radiation therapy: Secondary | ICD-10-CM | POA: Diagnosis not present

## 2016-04-17 LAB — TYPE AND SCREEN
ABO/RH(D): O POS
Antibody Screen: NEGATIVE
Unit division: 0

## 2016-04-17 MED ORDER — RADIAPLEXRX EX GEL
Freq: Once | CUTANEOUS | Status: AC
  Administered 2016-04-17: 12:00:00 via TOPICAL

## 2016-04-17 NOTE — Addendum Note (Signed)
Encounter addended by: Heywood Footman, RN on: 04/17/2016 11:56 AM<BR>    Actions taken: Diagnosis association updated, Order Entry activity accessed, MAR administration accepted

## 2016-04-17 NOTE — Progress Notes (Signed)
Weight and vitals stable. Denies pain. Reports an occasional productive cough with white sputum. Reports scant hemoptysis. Reports she received two units of packed red blood cells yesterday. Reports dr. Imagene Gurney stopped bp medication. Denies difficulty or pain associated with swallowing. Denies skin changes within treatment field. Reports using radiaplex as directed. Reports fatigue. Reports SOB.   BP 107/64   Pulse 86   Resp 16   Wt 171 lb 4.8 oz (77.7 kg)   SpO2 100%   BMI 38.40 kg/m  Wt Readings from Last 3 Encounters:  04/17/16 171 lb 4.8 oz (77.7 kg)  04/14/16 169 lb 8 oz (76.9 kg)  04/10/16 168 lb 3.2 oz (76.3 kg)

## 2016-04-17 NOTE — Progress Notes (Signed)
  Radiation Oncology         [phone]   Name: Susan Mahoney MRN: [id number]   Date: 04/17/2016  DOB: November 02, 1950   Weekly Radiation Therapy Management      ICD-9-CM ICD-10-CM   1. Pancoast tumor of left lung (HCC) 162.3 C34.12     Current Dose: 28 Gy  Planned Dose:  66 Gy  Narrative  Vitals stable. Weight loss noted.  Weight and vitals stable. Denies pain. Reports an occasional productive cough with white sputum. Reports scant hemoptysis. Reports she received two units of packed red blood cells yesterday. Reports dr. Imagene Gurney stopped bp medication. Denies difficulty or pain associated with swallowing. Denies skin changes within treatment field. Reports using radiaplex as directed. Reports fatigue. Reports SOB.     The patient is without complaint. Set-up films were reviewed. The chart was checked.  Physical Findings  weight is 171 lb 4.8 oz (77.7 kg). Her blood pressure is 107/64 and her pulse is 86. Her respiration is 16 and oxygen saturation is 100%. . Weight essentially stable.  No significant changes.  Impression The patient is tolerating radiation.  Plan Continue treatment as planned.          Sheral Apley Tammi Klippel, M.D.  This document serves as a record of services personally performed by Tyler Pita, MD. It was created on his behalf by Truddie Hidden, a trained medical scribe. The creation of this record is based on the scribe's personal observations and the provider's statements to them. This document has been checked and approved by the attending provider.

## 2016-04-20 ENCOUNTER — Ambulatory Visit
Admission: RE | Admit: 2016-04-20 | Discharge: 2016-04-20 | Disposition: A | Discharge status: Home / Self Care | Payer: Medicaid Other | Source: Ambulatory Visit | Attending: Radiation Oncology | Admitting: Radiation Oncology

## 2016-04-20 DIAGNOSIS — Z510 Encounter for antineoplastic radiation therapy: Secondary | ICD-10-CM | POA: Diagnosis not present

## 2016-04-21 ENCOUNTER — Encounter: Payer: Self-pay | Admitting: Dietician

## 2016-04-21 ENCOUNTER — Encounter: Payer: Self-pay | Admitting: *Deleted

## 2016-04-21 ENCOUNTER — Encounter (HOSPITAL_COMMUNITY): Payer: Medicaid Other | Attending: Hematology & Oncology

## 2016-04-21 ENCOUNTER — Ambulatory Visit
Admission: RE | Admit: 2016-04-21 | Discharge: 2016-04-21 | Disposition: A | Discharge status: Home / Self Care | Payer: Medicaid Other | Source: Ambulatory Visit | Attending: Radiation Oncology | Admitting: Radiation Oncology

## 2016-04-21 ENCOUNTER — Other Ambulatory Visit (HOSPITAL_COMMUNITY): Payer: Self-pay | Admitting: Oncology

## 2016-04-21 VITALS — BP 139/77 | HR 92 | Temp 98.9°F | Resp 18 | Wt 172.0 lb

## 2016-04-21 DIAGNOSIS — Z510 Encounter for antineoplastic radiation therapy: Secondary | ICD-10-CM | POA: Diagnosis not present

## 2016-04-21 DIAGNOSIS — E538 Deficiency of other specified B group vitamins: Secondary | ICD-10-CM

## 2016-04-21 DIAGNOSIS — I10 Essential (primary) hypertension: Secondary | ICD-10-CM | POA: Insufficient documentation

## 2016-04-21 DIAGNOSIS — F17210 Nicotine dependence, cigarettes, uncomplicated: Secondary | ICD-10-CM | POA: Diagnosis not present

## 2016-04-21 DIAGNOSIS — Z90710 Acquired absence of both cervix and uterus: Secondary | ICD-10-CM | POA: Diagnosis not present

## 2016-04-21 DIAGNOSIS — D649 Anemia, unspecified: Secondary | ICD-10-CM

## 2016-04-21 DIAGNOSIS — Z8571 Personal history of Hodgkin lymphoma: Secondary | ICD-10-CM | POA: Diagnosis not present

## 2016-04-21 DIAGNOSIS — Z452 Encounter for adjustment and management of vascular access device: Secondary | ICD-10-CM

## 2016-04-21 DIAGNOSIS — C3412 Malignant neoplasm of upper lobe, left bronchus or lung: Secondary | ICD-10-CM | POA: Insufficient documentation

## 2016-04-21 DIAGNOSIS — Z5111 Encounter for antineoplastic chemotherapy: Secondary | ICD-10-CM

## 2016-04-21 DIAGNOSIS — Z9889 Other specified postprocedural states: Secondary | ICD-10-CM | POA: Insufficient documentation

## 2016-04-21 DIAGNOSIS — E118 Type 2 diabetes mellitus with unspecified complications: Secondary | ICD-10-CM | POA: Insufficient documentation

## 2016-04-21 DIAGNOSIS — R918 Other nonspecific abnormal finding of lung field: Secondary | ICD-10-CM

## 2016-04-21 LAB — CBC WITH DIFFERENTIAL/PLATELET
BASOS PCT: 0 %
Basophils Absolute: 0 10*3/uL (ref 0.0–0.1)
EOS ABS: 0.1 10*3/uL (ref 0.0–0.7)
EOS PCT: 2 %
HEMATOCRIT: 28.5 % — AB (ref 36.0–46.0)
HEMOGLOBIN: 9.1 g/dL — AB (ref 12.0–15.0)
LYMPHS PCT: 10 %
Lymphs Abs: 0.6 10*3/uL — ABNORMAL LOW (ref 0.7–4.0)
MCH: 22.7 pg — AB (ref 26.0–34.0)
MCHC: 31.9 g/dL (ref 30.0–36.0)
MCV: 71.1 fL — AB (ref 78.0–100.0)
MONOS PCT: 5 %
Monocytes Absolute: 0.3 10*3/uL (ref 0.1–1.0)
NEUTROS PCT: 83 %
Neutro Abs: 4.8 10*3/uL (ref 1.7–7.7)
Platelets: 158 10*3/uL (ref 150–400)
RBC: 4.01 MIL/uL (ref 3.87–5.11)
RDW: 23.4 % — ABNORMAL HIGH (ref 11.5–15.5)
WBC: 5.8 10*3/uL (ref 4.0–10.5)

## 2016-04-21 LAB — COMPREHENSIVE METABOLIC PANEL
ALBUMIN: 2.9 g/dL — AB (ref 3.5–5.0)
ALK PHOS: 76 U/L (ref 38–126)
ALT: 17 U/L (ref 14–54)
ANION GAP: 5 (ref 5–15)
AST: 19 U/L (ref 15–41)
BILIRUBIN TOTAL: 0.3 mg/dL (ref 0.3–1.2)
BUN: 22 mg/dL — ABNORMAL HIGH (ref 6–20)
CALCIUM: 8.7 mg/dL — AB (ref 8.9–10.3)
CO2: 23 mmol/L (ref 22–32)
Chloride: 108 mmol/L (ref 101–111)
Creatinine, Ser: 1.16 mg/dL — ABNORMAL HIGH (ref 0.44–1.00)
GFR calc Af Amer: 57 mL/min — ABNORMAL LOW (ref >60)
GFR, EST NON AFRICAN AMERICAN: 49 mL/min — AB (ref >60)
GLUCOSE: 230 mg/dL — AB (ref 65–99)
Potassium: 3.8 mmol/L (ref 3.5–5.1)
Sodium: 136 mmol/L (ref 135–145)
TOTAL PROTEIN: 6.3 g/dL — AB (ref 6.5–8.1)

## 2016-04-21 MED ORDER — FAMOTIDINE IN NACL 20-0.9 MG/50ML-% IV SOLN
20.0000 mg | Freq: Once | INTRAVENOUS | Status: AC
  Administered 2016-04-21: 20 mg via INTRAVENOUS

## 2016-04-21 MED ORDER — FAMOTIDINE IN NACL 20-0.9 MG/50ML-% IV SOLN
INTRAVENOUS | Status: AC
  Filled 2016-04-21: qty 50

## 2016-04-21 MED ORDER — DIPHENHYDRAMINE HCL 50 MG/ML IJ SOLN
50.0000 mg | Freq: Once | INTRAMUSCULAR | Status: AC
  Administered 2016-04-21: 50 mg via INTRAVENOUS
  Filled 2016-04-21: qty 1

## 2016-04-21 MED ORDER — PACLITAXEL CHEMO INJECTION 300 MG/50ML
45.0000 mg/m2 | Freq: Once | INTRAVENOUS | Status: AC
  Administered 2016-04-21: 78 mg via INTRAVENOUS
  Filled 2016-04-21: qty 13

## 2016-04-21 MED ORDER — PALONOSETRON HCL INJECTION 0.25 MG/5ML
INTRAVENOUS | Status: AC
  Filled 2016-04-21: qty 5

## 2016-04-21 MED ORDER — ALTEPLASE 2 MG IJ SOLR
2.0000 mg | Freq: Once | INTRAMUSCULAR | Status: AC | PRN
  Administered 2016-04-21: 2 mg
  Filled 2016-04-21: qty 2

## 2016-04-21 MED ORDER — SODIUM CHLORIDE 0.9 % IV SOLN
20.0000 mg | Freq: Once | INTRAVENOUS | Status: AC
  Administered 2016-04-21: 20 mg via INTRAVENOUS
  Filled 2016-04-21: qty 2

## 2016-04-21 MED ORDER — PALONOSETRON HCL INJECTION 0.25 MG/5ML
0.2500 mg | Freq: Once | INTRAVENOUS | Status: AC
  Administered 2016-04-21: 0.25 mg via INTRAVENOUS

## 2016-04-21 MED ORDER — HYDROCODONE-ACETAMINOPHEN 5-325 MG PO TABS: 1.0000 | ORAL_TABLET | ORAL | 0 refills | Status: DC | PRN

## 2016-04-21 MED ORDER — SODIUM CHLORIDE 0.9 % IV SOLN
Freq: Once | INTRAVENOUS | Status: AC
  Administered 2016-04-21: 12:00:00 via INTRAVENOUS

## 2016-04-21 MED ORDER — STERILE WATER FOR INJECTION IJ SOLN
INTRAMUSCULAR | Status: AC
  Filled 2016-04-21: qty 10

## 2016-04-21 MED ORDER — HEPARIN SOD (PORK) LOCK FLUSH 100 UNIT/ML IV SOLN
500.0000 [IU] | Freq: Once | INTRAVENOUS | Status: AC | PRN
  Administered 2016-04-21: 500 [IU]
  Filled 2016-04-21: qty 5

## 2016-04-21 MED ORDER — SODIUM CHLORIDE 0.9 % IV SOLN
174.0000 mg | Freq: Once | INTRAVENOUS | Status: AC
  Administered 2016-04-21: 170 mg via INTRAVENOUS
  Filled 2016-04-21: qty 17

## 2016-04-21 MED ORDER — SODIUM CHLORIDE 0.9% FLUSH
10.0000 mL | INTRAVENOUS | Status: DC | PRN
  Administered 2016-04-21: 10 mL
  Filled 2016-04-21: qty 10

## 2016-04-21 NOTE — Progress Notes (Signed)
[place] Clinical Social Work  Clinical Social Work was referred by [place] rounding for assessment of psychosocial needs due to ongoing financial concerns. Clinical Social Worker met with patient at [facility] to offer support and assess for needs.  Pt brought Cancer Care application and CSW assisted with application. CSW to submit application once financials are received. Pt continues to struggle with financial concerns and is receiving assistance through [facility] as well. CSW to continue to link pt and family to local resources in order to maximize outcomes.     Clinical Social Work interventions: Resource assistance  Loren Racer, [place] Tuesdays   [phone]

## 2016-04-21 NOTE — Progress Notes (Signed)
RD consulted due to weight loss. 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 Other co morbidities include Obesity, HTN, Tobacco abuse, DM.  Contacted Pt by visiting during her infusion  Wt Readings from Last 10 Encounters:  04/21/16 172 lb (78 kg)  04/17/16 171 lb 4.8 oz (77.7 kg)  04/14/16 169 lb 8 oz (76.9 kg)  04/10/16 168 lb 3.2 oz (76.3 kg)  04/07/16 172 lb (78 kg)  04/02/16 172 lb (78 kg)  03/31/16 171 lb 8 oz (77.8 kg)  03/20/16 174 lb 6.4 oz (79.1 kg)  03/13/16 175 lb 9.6 oz (79.7 kg)  03/10/16 173 lb 12.8 oz (78.8 kg)  Wt from 5/28 (admission in which mass was found) was 181 lbs.  Patient weight has decreased by 9 lbs since starting treatment. Today, pt reports her new UBW as 170-175 lbs.   Patient reports oral intake as fair and is suffering from symptoms including a loss of appetite.   She states that through her treatment course, her appetite has decreased. As a result, her portions at each meal have decreased in size. She says she is now eating 4-5 small meals a day. Acknowledged this is exactly what she should be doing. She does not take any vitamin/mineral supplements at home and does not use Boost/ensure. She received a few samples a short while ago and that was the first time she has had them. SHe says "I didn't think I needed them".   RD described Ensure Assistance program. She said she could take a case now, but it was not of urgency.   Discussed with pt the basics of nutrition therapy during treatment: minimize side effects w/ diet, prioritize eating high kcal/protein foods as able and eating frequently. All of this is done in attempt to maintain weight and, by association, improve outcomes.   RD went over list of high kcal/high protein foods and which food groups should be be prioritized and which food groups should be less frequently chosen.   She follows a diabetic diet at this time. She is told that if she does continue losing weight, her cancer  treatment takes precedence and she should start worrying about her BG control and more about eating high calorie foods. For example, she drinks mostly water, but if she continues to lose weight, she is advised to switch to a calorie containing beverage such as milk, juice, soda, coffee, tea etc.   At this time, pt appears to be nutritionally stable; she does report a decreased appetite, but has been able to compensate by eating more frequently. Wt currently stable.   No need for scheduled follow up at this time. RD will monitor from periphery and follow up if warranted.   Left my contact info, coupons, and handouts titled "Making the Most of Each Bite". Will order ensure case  Burtis Junes RD, LDN, [facility] Pager: [phone] 04/21/2016 8:59 AM

## 2016-04-21 NOTE — Progress Notes (Signed)
Unable to obtain blood from port which flushed easily without any complaints of discomfort. Alteplase 2 ml flushed thru port per protocol.30 minutes later port rechecked still with no blood return noted Allowed Alteplase to dwell 90 more minutes and was able to easily withdraw the 2 ml of Alteplase as well as 18 ml of blood. Susan Mahoney tolerated her chemo tx well without complaints. Pt discharged ambulatory in satisfactory condition with family

## 2016-04-21 NOTE — Patient Instructions (Signed)
[facility] Discharge Instructions for Patients Receiving Chemotherapy   Beginning January 23rd 2017 lab work for the Cancer Center will be done in the  Main lab at [place] on 1st floor. If you have a lab appointment with the Cancer Center please come in thru the  Main Entrance and check in at the main information desk   Today you received the following chemotherapy agents Taxol and Carboplatin. Follow-up as scheduled. Call clinic for any questions or concerns  To help prevent nausea and vomiting after your treatment, we encourage you to take your nausea medication.   If you develop nausea and vomiting, or diarrhea that is not controlled by your medication, call the clinic.  The clinic phone number is [phone]. Office hours are Monday-Friday 8:30am-5:00pm.  BELOW ARE SYMPTOMS THAT SHOULD BE REPORTED IMMEDIATELY:  *FEVER GREATER THAN 101.0 F  *CHILLS WITH OR WITHOUT FEVER  NAUSEA AND VOMITING THAT IS NOT CONTROLLED WITH YOUR NAUSEA MEDICATION  *UNUSUAL SHORTNESS OF BREATH  *UNUSUAL BRUISING OR BLEEDING  TENDERNESS IN MOUTH AND THROAT WITH OR WITHOUT PRESENCE OF ULCERS  *URINARY PROBLEMS  *BOWEL PROBLEMS  UNUSUAL RASH Items with * indicate a potential emergency and should be followed up as soon as possible. If you have an emergency after office hours please contact your primary care physician or go to the nearest emergency department.  Please call the clinic during office hours if you have any questions or concerns.   You may also contact the Patient Navigator at [phone] should you have any questions or need assistance in obtaining follow up care.      Resources For Cancer Patients and their Caregivers ? American Cancer Society: Can assist with transportation, wigs, general needs, runs Look Good Feel Better.        1-888-227-6333 ? Cancer Care: Provides financial assistance, online support groups, medication/co-pay assistance.   1-800-813-HOPE (4673) ? [facility] Assists [place] cancer patients and their families through emotional , educational and financial support.  336-427-4357 ? [place] DSS Where to apply for food stamps, Medicaid and utility assistance. 336-342-1394 ? RCATS: Transportation to medical appointments. 336-347-2287 ? Social Security Administration: May apply for disability if have a Stage IV cancer. 336-342-7796 1-800-772-1213 ? [place] Aging, Disability and Transit Services: Assists with nutrition, care and transit needs. 336-349-2343         

## 2016-04-22 ENCOUNTER — Ambulatory Visit
Admission: RE | Admit: 2016-04-22 | Discharge: 2016-04-22 | Disposition: A | Discharge status: Home / Self Care | Payer: Medicaid Other | Source: Ambulatory Visit | Attending: Radiation Oncology | Admitting: Radiation Oncology

## 2016-04-22 DIAGNOSIS — Z510 Encounter for antineoplastic radiation therapy: Secondary | ICD-10-CM | POA: Diagnosis not present

## 2016-04-22 LAB — HEMOGLOBINOPATHY EVALUATION
HGB A: 98 % (ref 94.0–98.0)
HGB C: 0 %
HGB S QUANTITAION: 0 %
Hgb A2 Quant: 2 % (ref 0.7–3.1)
Hgb F Quant: 0 % (ref 0.0–2.0)

## 2016-04-23 ENCOUNTER — Ambulatory Visit
Admission: RE | Admit: 2016-04-23 | Discharge: 2016-04-23 | Disposition: A | Discharge status: Home / Self Care | Payer: Medicaid Other | Source: Ambulatory Visit | Attending: Radiation Oncology | Admitting: Radiation Oncology

## 2016-04-23 DIAGNOSIS — Z510 Encounter for antineoplastic radiation therapy: Secondary | ICD-10-CM | POA: Diagnosis not present

## 2016-04-23 MED FILL — HYDROCODON-APAP 5-325: 5-325 | 7 days supply | Qty: 90 | Fill #0

## 2016-04-24 ENCOUNTER — Ambulatory Visit
Admission: RE | Admit: 2016-04-24 | Discharge: 2016-04-24 | Disposition: A | Discharge status: Home / Self Care | Payer: Medicaid Other | Source: Ambulatory Visit | Attending: Radiation Oncology | Admitting: Radiation Oncology

## 2016-04-24 ENCOUNTER — Ambulatory Visit
Admission: RE | Admit: 2016-04-24 | Discharge: 2016-04-24 | Disposition: A | Discharge status: Home / Self Care | Payer: Self-pay | Source: Ambulatory Visit | Attending: Radiation Oncology | Admitting: Radiation Oncology

## 2016-04-24 VITALS — BP 123/65 | HR 64 | Resp 16 | Wt 174.7 lb

## 2016-04-24 DIAGNOSIS — Z510 Encounter for antineoplastic radiation therapy: Secondary | ICD-10-CM | POA: Diagnosis not present

## 2016-04-24 DIAGNOSIS — C3412 Malignant neoplasm of upper lobe, left bronchus or lung: Secondary | ICD-10-CM

## 2016-04-24 NOTE — Progress Notes (Signed)
Weight and vitals stable. Denies pain. Reports frequency of cough is much less. Reports in the morning her cough is productive with clear blood tinged sputum. Reports occasional difficulty swallowing. Reports over all she is breathing better. Reports SOB has greatly improved. Faint hyperpigmentation without desquamation noted. Reports using radiaplex bid as directed.   BP 123/65   Pulse 64   Resp 16   Wt 174 lb 11.2 oz (79.2 kg)   SpO2 100%   BMI 39.17 kg/m  Wt Readings from Last 3 Encounters:  04/24/16 174 lb 11.2 oz (79.2 kg)  04/21/16 172 lb (78 kg)  04/17/16 171 lb 4.8 oz (77.7 kg)

## 2016-04-24 NOTE — Progress Notes (Signed)
  Radiation Oncology         [phone]   Name: Susan Mahoney MRN: [id number]   Date: 04/24/2016  DOB: [birth year]   Weekly Radiation Therapy Management      ICD-9-CM ICD-10-CM   1. Pancoast tumor of left lung (HCC) 162.3 C34.12     Current Dose: 38 Gy  Planned Dose:  66 Gy  Narrative The patient is here for weekly assessment.   Weight and vitals stable. Denies pain. Reports frequency of cough is much less. Reports in the morning her cough is productive with clear blood tinged sputum. Reports occasional difficulty swallowing. Reports over all she is breathing better. Reports SOB has greatly improved. Faint hyperpigmentation without desquamation noted. Reports using radiaplex bid as directed. She mentions she tried to take some pain medication earlier this week and vomited because she was having difficulty swallowing.  The patient is without complaint. Set-up films were reviewed. The chart was checked.  Physical Findings  weight is 174 lb 11.2 oz (79.2 kg). Her blood pressure is 123/65 and her pulse is 64. Her respiration is 16 and oxygen saturation is 100%. . Weight essentially stable.  No significant changes.  Impression The patient is tolerating radiation.  Plan Continue treatment as planned. We considered Carafate but she is not interested at this time. Reconsider Carafate next week.         Sheral Apley Tammi Klippel, M.D.    This document serves as a record of services personally performed by Tyler Pita, MD. It was created on his behalf by Lendon Collar, a trained medical scribe. The creation of this record is based on the scribe's personal observations and the provider's statements to them. This document has been checked and approved by the attending provider.

## 2016-04-27 ENCOUNTER — Ambulatory Visit
Admission: RE | Admit: 2016-04-27 | Discharge: 2016-04-27 | Disposition: A | Discharge status: Home / Self Care | Payer: Medicaid Other | Source: Ambulatory Visit | Attending: Radiation Oncology | Admitting: Radiation Oncology

## 2016-04-27 DIAGNOSIS — Z510 Encounter for antineoplastic radiation therapy: Secondary | ICD-10-CM | POA: Diagnosis not present

## 2016-04-28 ENCOUNTER — Ambulatory Visit (HOSPITAL_COMMUNITY)
Admission: RE | Admit: 2016-04-28 | Discharge: 2016-04-28 | Disposition: A | Discharge status: Home / Self Care | Payer: Medicaid Other | Source: Ambulatory Visit | Attending: Oncology | Admitting: Oncology

## 2016-04-28 ENCOUNTER — Encounter (HOSPITAL_BASED_OUTPATIENT_CLINIC_OR_DEPARTMENT_OTHER): Payer: Medicaid Other

## 2016-04-28 ENCOUNTER — Ambulatory Visit
Admission: RE | Admit: 2016-04-28 | Discharge: 2016-04-28 | Disposition: A | Discharge status: Home / Self Care | Payer: Medicaid Other | Source: Ambulatory Visit | Attending: Radiation Oncology | Admitting: Radiation Oncology

## 2016-04-28 VITALS — BP 113/87 | HR 87 | Temp 98.1°F | Resp 18 | Wt 175.6 lb

## 2016-04-28 DIAGNOSIS — Z95828 Presence of other vascular implants and grafts: Secondary | ICD-10-CM | POA: Diagnosis not present

## 2016-04-28 DIAGNOSIS — D649 Anemia, unspecified: Secondary | ICD-10-CM

## 2016-04-28 DIAGNOSIS — I272 Other secondary pulmonary hypertension: Secondary | ICD-10-CM | POA: Diagnosis not present

## 2016-04-28 DIAGNOSIS — Z510 Encounter for antineoplastic radiation therapy: Secondary | ICD-10-CM | POA: Diagnosis not present

## 2016-04-28 DIAGNOSIS — C3412 Malignant neoplasm of upper lobe, left bronchus or lung: Secondary | ICD-10-CM | POA: Diagnosis present

## 2016-04-28 DIAGNOSIS — Z5111 Encounter for antineoplastic chemotherapy: Secondary | ICD-10-CM

## 2016-04-28 DIAGNOSIS — R918 Other nonspecific abnormal finding of lung field: Secondary | ICD-10-CM | POA: Diagnosis not present

## 2016-04-28 LAB — COMPREHENSIVE METABOLIC PANEL
ALBUMIN: 2.9 g/dL — AB (ref 3.5–5.0)
ALT: 18 U/L (ref 14–54)
ANION GAP: 9 (ref 5–15)
AST: 17 U/L (ref 15–41)
Alkaline Phosphatase: 75 U/L (ref 38–126)
BUN: 19 mg/dL (ref 6–20)
CHLORIDE: 105 mmol/L (ref 101–111)
CO2: 25 mmol/L (ref 22–32)
Calcium: 8.6 mg/dL — ABNORMAL LOW (ref 8.9–10.3)
Creatinine, Ser: 1.1 mg/dL — ABNORMAL HIGH (ref 0.44–1.00)
GFR calc Af Amer: >60 mL/min (ref >60)
GFR calc non Af Amer: 52 mL/min — ABNORMAL LOW (ref >60)
GLUCOSE: 193 mg/dL — AB (ref 65–99)
POTASSIUM: 3.5 mmol/L (ref 3.5–5.1)
SODIUM: 139 mmol/L (ref 135–145)
TOTAL PROTEIN: 6.2 g/dL — AB (ref 6.5–8.1)
Total Bilirubin: 0.5 mg/dL (ref 0.3–1.2)

## 2016-04-28 LAB — CBC WITH DIFFERENTIAL/PLATELET
BASOS ABS: 0 10*3/uL (ref 0.0–0.1)
BASOS PCT: 0 %
EOS ABS: 0.1 10*3/uL (ref 0.0–0.7)
Eosinophils Relative: 1 %
HCT: 25.9 % — ABNORMAL LOW (ref 36.0–46.0)
Hemoglobin: 8.2 g/dL — ABNORMAL LOW (ref 12.0–15.0)
Lymphocytes Relative: 12 %
Lymphs Abs: 0.5 10*3/uL — ABNORMAL LOW (ref 0.7–4.0)
MCH: 22.7 pg — ABNORMAL LOW (ref 26.0–34.0)
MCHC: 31.7 g/dL (ref 30.0–36.0)
MCV: 71.7 fL — ABNORMAL LOW (ref 78.0–100.0)
MONO ABS: 0.4 10*3/uL (ref 0.1–1.0)
MONOS PCT: 8 %
NEUTROS ABS: 3.4 10*3/uL (ref 1.7–7.7)
Neutrophils Relative %: 78 %
PLATELETS: 103 10*3/uL — AB (ref 150–400)
RBC: 3.61 MIL/uL — ABNORMAL LOW (ref 3.87–5.11)
RDW: 23.3 % — AB (ref 11.5–15.5)
WBC: 4.3 10*3/uL (ref 4.0–10.5)

## 2016-04-28 LAB — PREPARE RBC (CROSSMATCH)

## 2016-04-28 MED ORDER — FAMOTIDINE IN NACL 20-0.9 MG/50ML-% IV SOLN
INTRAVENOUS | Status: AC
  Filled 2016-04-28: qty 50

## 2016-04-28 MED ORDER — DIPHENHYDRAMINE HCL 50 MG/ML IJ SOLN
50.0000 mg | Freq: Once | INTRAMUSCULAR | Status: AC
  Administered 2016-04-28: 50 mg via INTRAVENOUS

## 2016-04-28 MED ORDER — CARBOPLATIN CHEMO INJECTION 450 MG/45ML
180.8000 mg | Freq: Once | INTRAVENOUS | Status: AC
  Administered 2016-04-28: 180 mg via INTRAVENOUS
  Filled 2016-04-28: qty 18

## 2016-04-28 MED ORDER — FAMOTIDINE IN NACL 20-0.9 MG/50ML-% IV SOLN
20.0000 mg | Freq: Once | INTRAVENOUS | Status: AC
  Administered 2016-04-28: 20 mg via INTRAVENOUS

## 2016-04-28 MED ORDER — SODIUM CHLORIDE 0.9 % IV SOLN
20.0000 mg | Freq: Once | INTRAVENOUS | Status: AC
  Administered 2016-04-28: 20 mg via INTRAVENOUS
  Filled 2016-04-28: qty 2

## 2016-04-28 MED ORDER — SODIUM CHLORIDE 0.9% FLUSH
10.0000 mL | INTRAVENOUS | Status: DC | PRN
  Administered 2016-04-28: 10 mL
  Filled 2016-04-28: qty 10

## 2016-04-28 MED ORDER — HEPARIN SOD (PORK) LOCK FLUSH 100 UNIT/ML IV SOLN
500.0000 [IU] | Freq: Once | INTRAVENOUS | Status: AC | PRN
  Administered 2016-04-28: 500 [IU]

## 2016-04-28 MED ORDER — SODIUM CHLORIDE 0.9 % IV SOLN
Freq: Once | INTRAVENOUS | Status: AC
  Administered 2016-04-28: 10:00:00 via INTRAVENOUS

## 2016-04-28 MED ORDER — DIPHENHYDRAMINE HCL 50 MG/ML IJ SOLN
INTRAMUSCULAR | Status: AC
  Filled 2016-04-28: qty 1

## 2016-04-28 MED ORDER — PALONOSETRON HCL INJECTION 0.25 MG/5ML
0.2500 mg | Freq: Once | INTRAVENOUS | Status: AC
  Administered 2016-04-28: 0.25 mg via INTRAVENOUS

## 2016-04-28 MED ORDER — PALONOSETRON HCL INJECTION 0.25 MG/5ML
INTRAVENOUS | Status: AC
  Filled 2016-04-28: qty 5

## 2016-04-28 MED ORDER — DEXTROSE 5 % IV SOLN
45.0000 mg/m2 | Freq: Once | INTRAVENOUS | Status: AC
  Administered 2016-04-28: 78 mg via INTRAVENOUS
  Filled 2016-04-28: qty 13

## 2016-04-28 NOTE — Patient Instructions (Signed)
[facility] Discharge Instructions for Patients Receiving Chemotherapy   Beginning January 23rd 2017 lab work for the [facility] will be done in the  Main lab at [facility] on 1st floor. If you have a lab appointment with the [facility] please come in thru the  Main Entrance and check in at the main information desk   Today you received the following chemotherapy agents Taxol and Carbo. Blood transfusion Thursday at [place] in Short Stay. Chest x-ray today before leaving.  To help prevent nausea and vomiting after your treatment, we encourage you to take your nausea medication as instructed. If you develop nausea and vomiting, or diarrhea that is not controlled by your medication, call the clinic.  The clinic phone number is [phone]. Office hours are Monday-Friday 8:30am-5:00pm.  BELOW ARE SYMPTOMS THAT SHOULD BE REPORTED IMMEDIATELY:  *FEVER GREATER THAN 101.0 F  *CHILLS WITH OR WITHOUT FEVER  NAUSEA AND VOMITING THAT IS NOT CONTROLLED WITH YOUR NAUSEA MEDICATION  *UNUSUAL SHORTNESS OF BREATH  *UNUSUAL BRUISING OR BLEEDING  TENDERNESS IN MOUTH AND THROAT WITH OR WITHOUT PRESENCE OF ULCERS  *URINARY PROBLEMS  *BOWEL PROBLEMS  UNUSUAL RASH Items with * indicate a potential emergency and should be followed up as soon as possible. If you have an emergency after office hours please contact your primary care physician or go to the nearest emergency department.  Please call the clinic during office hours if you have any questions or concerns.   You may also contact the Patient Navigator at [phone] should you have any questions or need assistance in obtaining follow up care.      Resources For Cancer Patients and their Caregivers ? American Cancer Society: Can assist with transportation, wigs, general needs, runs Look Good Feel Better.        7087836375 ? Cancer Care: Provides financial assistance, online support groups,  medication/co-pay assistance.  1-800-813-HOPE 639-706-7805) ? [facility] Assists [place] cancer patients and their families through emotional , educational and financial support.  938 254 4135 ? [place] DSS Where to apply for food stamps, Medicaid and utility assistance. (765)019-7371 ? RCATS: Transportation to medical appointments. 310-225-8117 ? Social Security Administration: May apply for disability if have a Stage IV cancer. (920)778-0741 (640)820-5044 ? [employer], Disability and Transit Services: Assists with nutrition, care and transit needs. [phone]

## 2016-04-28 NOTE — Progress Notes (Signed)
Tolerated chemo well. Stable on discharge home with husband via wheelchair.

## 2016-04-29 ENCOUNTER — Ambulatory Visit
Admission: RE | Admit: 2016-04-29 | Discharge: 2016-04-29 | Disposition: A | Discharge status: Home / Self Care | Payer: Medicaid Other | Source: Ambulatory Visit | Attending: Radiation Oncology | Admitting: Radiation Oncology

## 2016-04-29 DIAGNOSIS — Z510 Encounter for antineoplastic radiation therapy: Secondary | ICD-10-CM | POA: Diagnosis not present

## 2016-04-30 ENCOUNTER — Encounter (HOSPITAL_COMMUNITY)
Admission: RE | Admit: 2016-04-30 | Discharge: 2016-04-30 | Disposition: A | Discharge status: Home / Self Care | Payer: Medicaid Other | Source: Ambulatory Visit | Attending: Hematology & Oncology | Admitting: Hematology & Oncology

## 2016-04-30 ENCOUNTER — Encounter (HOSPITAL_COMMUNITY): Payer: Self-pay

## 2016-04-30 ENCOUNTER — Encounter (HOSPITAL_COMMUNITY): Payer: Self-pay | Admitting: Emergency Medicine

## 2016-04-30 ENCOUNTER — Ambulatory Visit
Admission: RE | Admit: 2016-04-30 | Discharge: 2016-04-30 | Disposition: A | Discharge status: Home / Self Care | Payer: Medicaid Other | Source: Ambulatory Visit | Attending: Radiation Oncology | Admitting: Radiation Oncology

## 2016-04-30 DIAGNOSIS — Z510 Encounter for antineoplastic radiation therapy: Secondary | ICD-10-CM | POA: Diagnosis not present

## 2016-04-30 DIAGNOSIS — D649 Anemia, unspecified: Secondary | ICD-10-CM | POA: Diagnosis present

## 2016-04-30 DIAGNOSIS — C3412 Malignant neoplasm of upper lobe, left bronchus or lung: Secondary | ICD-10-CM | POA: Insufficient documentation

## 2016-04-30 MED ORDER — SODIUM CHLORIDE 0.9 % IV SOLN: 250.0000 mL | Freq: Once | INTRAVENOUS | Status: DC

## 2016-04-30 MED ORDER — SODIUM CHLORIDE 0.9% FLUSH
10.0000 mL | INTRAVENOUS | Status: AC | PRN
  Administered 2016-04-30: 10 mL
  Filled 2016-04-30: qty 10

## 2016-04-30 MED ORDER — HEPARIN SOD (PORK) LOCK FLUSH 100 UNIT/ML IV SOLN
500.0000 [IU] | Freq: Every day | INTRAVENOUS | Status: AC | PRN
  Administered 2016-04-30: 500 [IU]
  Filled 2016-04-30: qty 5

## 2016-04-30 MED ORDER — ACETAMINOPHEN 325 MG PO TABS
650.0000 mg | ORAL_TABLET | Freq: Once | ORAL | Status: AC
  Administered 2016-04-30: 650 mg via ORAL
  Filled 2016-04-30: qty 2

## 2016-04-30 MED ORDER — DIPHENHYDRAMINE HCL 25 MG PO CAPS
25.0000 mg | ORAL_CAPSULE | Freq: Once | ORAL | Status: AC
  Administered 2016-04-30: 25 mg via ORAL
  Filled 2016-04-30: qty 1

## 2016-04-30 NOTE — Progress Notes (Signed)
Spoke with Susan Mahoney at [facility] pathology and ordered PDL-one and foundation one.

## 2016-05-01 ENCOUNTER — Ambulatory Visit
Admission: RE | Admit: 2016-05-01 | Discharge: 2016-05-01 | Disposition: A | Discharge status: Home / Self Care | Payer: Self-pay | Source: Ambulatory Visit | Attending: Radiation Oncology | Admitting: Radiation Oncology

## 2016-05-01 ENCOUNTER — Other Ambulatory Visit (HOSPITAL_COMMUNITY)
Admission: RE | Admit: 2016-05-01 | Discharge: 2016-05-01 | Disposition: A | Discharge status: Home / Self Care | Payer: Medicaid Other | Source: Ambulatory Visit | Attending: Hematology & Oncology | Admitting: Hematology & Oncology

## 2016-05-01 ENCOUNTER — Ambulatory Visit
Admission: RE | Admit: 2016-05-01 | Discharge: 2016-05-01 | Disposition: A | Discharge status: Home / Self Care | Payer: Medicaid Other | Source: Ambulatory Visit | Attending: Radiation Oncology | Admitting: Radiation Oncology

## 2016-05-01 VITALS — BP 128/83 | HR 98 | Resp 18 | Wt 176.2 lb

## 2016-05-01 DIAGNOSIS — Z510 Encounter for antineoplastic radiation therapy: Secondary | ICD-10-CM | POA: Diagnosis not present

## 2016-05-01 DIAGNOSIS — C3412 Malignant neoplasm of upper lobe, left bronchus or lung: Secondary | ICD-10-CM | POA: Insufficient documentation

## 2016-05-01 LAB — TYPE AND SCREEN
ABO/RH(D): O POS
ANTIBODY SCREEN: NEGATIVE
UNIT DIVISION: 0

## 2016-05-01 MED ORDER — SUCRALFATE 1 G PO TABS: 1.0000 g | ORAL_TABLET | Freq: Three times a day (TID) | ORAL | 0 refills | Status: DC

## 2016-05-01 MED FILL — SUCRALFATE 1 GM TABLET: 1 | 30 days supply | Qty: 120 | Fill #0

## 2016-05-01 NOTE — Progress Notes (Signed)
Department of Radiation Oncology  Phone:  [phone] Fax:        [phone]  Weekly Treatment Note    Name: Susan Mahoney Date: 05/03/2016 MRN: [id number] DOB: 07/30/1951   Diagnosis:     ICD-9-CM ICD-10-CM   1. Pancoast tumor of left lung (HCC) 162.3 C34.12      Current dose: 48 Gy  Current fraction: 24   MEDICATIONS: Current Outpatient Prescriptions  Medication Sig Dispense Refill  . acetaminophen (TYLENOL) 500 MG tablet Take 1,000 mg by mouth every 6 (six) hours as needed for mild pain or moderate pain.     Marland Kitchen albuterol (PROVENTIL HFA;VENTOLIN HFA) 108 (90 BASE) MCG/ACT inhaler Inhale 1-2 puffs into the lungs every 6 (six) hours as needed for wheezing or shortness of breath. 1 Inhaler 0  . CARBOPLATIN IV Inject into the vein. Weekly with radiation    . Cetirizine HCl 10 MG CAPS Take 1 capsule (10 mg total) by mouth at bedtime. Restart after you've completed a three-day course of Benadryl.    . Cholecalciferol (VITAMIN D PO) Take 1 tablet by mouth daily. Reported on 03/31/2016    . ferrous gluconate (FERGON) 324 MG tablet Take 1 tablet (324 mg total) by mouth 2 (two) times daily with a meal. 60 tablet 3  . glipiZIDE (GLUCOTROL) 10 MG tablet Take 5 mg by mouth daily after supper.    Marland Kitchen HYDROcodone-acetaminophen (NORCO) 5-325 MG tablet Take 1-2 tablets by mouth every 4 (four) hours as needed for moderate pain. 90 tablet 0  . lidocaine-prilocaine (EMLA) cream Apply a quarter size amount to port site 1 hour prior to chemo. Do not rub in. Cover with plastic wrap. 30 g 3  . ondansetron (ZOFRAN) 8 MG tablet Take 1 tablet (8 mg total) by mouth every 8 (eight) hours as needed for nausea or vomiting. 30 tablet 2  . oxymetazoline (NASAL SPRAY 12 HOUR) 0.05 % nasal spray Place 1 spray into both nostrils daily as needed for congestion. Reported on 02/28/2016    . PACLitaxel (TAXOL) 300 MG/50ML injection Inject into the vein. Weekly with radiation    . pantoprazole (PROTONIX) 40 MG  tablet Take 1 tablet (40 mg total) by mouth 2 (two) times daily before a meal. 60 tablet 1  . prochlorperazine (COMPAZINE) 10 MG tablet Take 1 tablet (10 mg total) by mouth every 6 (six) hours as needed for nausea or vomiting. 30 tablet 2  . triamterene-hydrochlorothiazide (DYAZIDE) 37.5-25 MG per capsule Take 1 each (1 capsule total) by mouth daily.    . sucralfate (CARAFATE) 1 g tablet Take 1 tablet (1 g total) by mouth 4 (four) times daily -  with meals and at bedtime. 120 tablet 0   No current facility-administered medications for this encounter.      ALLERGIES: Ace inhibitors and Indomethacin   LABORATORY DATA:  Lab Results  Component Value Date   WBC 4.3 04/28/2016   HGB 8.2 (L) 04/28/2016   HCT 25.9 (L) 04/28/2016   MCV 71.7 (L) 04/28/2016   PLT 103 (L) 04/28/2016   Lab Results  Component Value Date   NA 139 04/28/2016   K 3.5 04/28/2016   CL 105 04/28/2016   CO2 25 04/28/2016   Lab Results  Component Value Date   ALT 18 04/28/2016   AST 17 04/28/2016   ALKPHOS 75 04/28/2016   BILITOT 0.5 04/28/2016     NARRATIVE: Susan Mahoney was seen today for weekly treatment management. The chart was checked and  the patient's films were reviewed.  Weight and vitals stable. Denies pain. Reports feeling stronger today following blood transfusion yesterday. Reports frequency and intensity of cough are less. Reports in the morning her cough is productive with clear sputum that is blood tinged. Reports occasional difficulty swallowing. Requesting medication to manage difficulty swallowing. Confirms overall she is breathing better. Reports SOB continues to improve.   PHYSICAL EXAMINATION: weight is 176 lb 3.2 oz (79.9 kg). Her blood pressure is 128/83 and her pulse is 98. Her respiration is 18 and oxygen saturation is 100%.      Alert, in no acute distress.   ASSESSMENT: The patient is doing satisfactorily with treatment.  PLAN: We will continue with the patient's radiation  treatment as planned. Patient was prescribed Carafate today.     This document serves as a record of services personally performed by Kyung Rudd, MD. It was created on his behalf by Arlyce Harman, a trained medical scribe. The creation of this record is based on the scribe's personal observations and the provider's statements to them. This document has been checked and approved by the attending provider.  ------------------------------------------------  Jodelle Gross, MD, PhD

## 2016-05-01 NOTE — Progress Notes (Signed)
Weight and vitals stable. Denies pain. Reports feeling stronger today following blood transfusion yesterday. Reports frequency and intensity of cough are less. Reports in the morning her cough is productive with clear sputum that is blood tinged. Reports occasional difficulty swallowing. Requesting medication to manage difficulty swallowing. Confirms overall she is breathing better. Reports SOB continues to improve. Hyperpigmentation without desquamation within treatment field. Encouraged patient to use radiaplex bid as directed. Patient verbalized understanding.   BP 128/83   Pulse 98   Resp 18   Wt 176 lb 3.2 oz (79.9 kg)   SpO2 100%   BMI 39.50 kg/m  Wt Readings from Last 3 Encounters:  05/01/16 176 lb 3.2 oz (79.9 kg)  04/30/16 175 lb (79.4 kg)  04/28/16 175 lb 9.6 oz (79.7 kg)

## 2016-05-04 ENCOUNTER — Ambulatory Visit
Admission: RE | Admit: 2016-05-04 | Discharge: 2016-05-04 | Disposition: A | Discharge status: Home / Self Care | Payer: Medicaid Other | Source: Ambulatory Visit | Attending: Radiation Oncology | Admitting: Radiation Oncology

## 2016-05-04 DIAGNOSIS — Z510 Encounter for antineoplastic radiation therapy: Secondary | ICD-10-CM | POA: Diagnosis not present

## 2016-05-05 ENCOUNTER — Ambulatory Visit
Admission: RE | Admit: 2016-05-05 | Discharge: 2016-05-05 | Disposition: A | Discharge status: Home / Self Care | Payer: Medicaid Other | Source: Ambulatory Visit | Attending: Radiation Oncology | Admitting: Radiation Oncology

## 2016-05-05 ENCOUNTER — Encounter (HOSPITAL_COMMUNITY): Payer: Self-pay

## 2016-05-05 ENCOUNTER — Encounter (HOSPITAL_BASED_OUTPATIENT_CLINIC_OR_DEPARTMENT_OTHER): Payer: Medicaid Other

## 2016-05-05 VITALS — BP 123/56 | HR 111 | Temp 99.9°F | Resp 18 | Wt 169.0 lb

## 2016-05-05 DIAGNOSIS — C3412 Malignant neoplasm of upper lobe, left bronchus or lung: Secondary | ICD-10-CM | POA: Diagnosis not present

## 2016-05-05 DIAGNOSIS — Z5111 Encounter for antineoplastic chemotherapy: Secondary | ICD-10-CM

## 2016-05-05 DIAGNOSIS — Z510 Encounter for antineoplastic radiation therapy: Secondary | ICD-10-CM | POA: Diagnosis not present

## 2016-05-05 LAB — CBC WITH DIFFERENTIAL/PLATELET
BASOS ABS: 0 10*3/uL (ref 0.0–0.1)
Basophils Relative: 0 %
EOS ABS: 0.1 10*3/uL (ref 0.0–0.7)
Eosinophils Relative: 2 %
HEMATOCRIT: 27.9 % — AB (ref 36.0–46.0)
Hemoglobin: 8.8 g/dL — ABNORMAL LOW (ref 12.0–15.0)
LYMPHS PCT: 12 %
Lymphs Abs: 0.4 10*3/uL — ABNORMAL LOW (ref 0.7–4.0)
MCH: 23.3 pg — ABNORMAL LOW (ref 26.0–34.0)
MCHC: 31.5 g/dL (ref 30.0–36.0)
MCV: 73.8 fL — AB (ref 78.0–100.0)
MONO ABS: 0.2 10*3/uL (ref 0.1–1.0)
Monocytes Relative: 8 %
NEUTROS ABS: 2.4 10*3/uL (ref 1.7–7.7)
NEUTROS PCT: 78 %
Platelets: 91 10*3/uL — ABNORMAL LOW (ref 150–400)
RBC: 3.78 MIL/uL — ABNORMAL LOW (ref 3.87–5.11)
RDW: 23.7 % — AB (ref 11.5–15.5)
WBC: 3.1 10*3/uL — ABNORMAL LOW (ref 4.0–10.5)

## 2016-05-05 LAB — COMPREHENSIVE METABOLIC PANEL
ALBUMIN: 2.9 g/dL — AB (ref 3.5–5.0)
ALT: 26 U/L (ref 14–54)
AST: 22 U/L (ref 15–41)
Alkaline Phosphatase: 124 U/L (ref 38–126)
Anion gap: 10 (ref 5–15)
BILIRUBIN TOTAL: 1 mg/dL (ref 0.3–1.2)
BUN: 25 mg/dL — AB (ref 6–20)
CHLORIDE: 98 mmol/L — AB (ref 101–111)
CO2: 29 mmol/L (ref 22–32)
CREATININE: 1.26 mg/dL — AB (ref 0.44–1.00)
Calcium: 9.1 mg/dL (ref 8.9–10.3)
GFR calc Af Amer: 51 mL/min — ABNORMAL LOW (ref >60)
GFR, EST NON AFRICAN AMERICAN: 44 mL/min — AB (ref >60)
GLUCOSE: 281 mg/dL — AB (ref 65–99)
POTASSIUM: 3.7 mmol/L (ref 3.5–5.1)
Sodium: 137 mmol/L (ref 135–145)
TOTAL PROTEIN: 6.8 g/dL (ref 6.5–8.1)

## 2016-05-05 MED ORDER — PALONOSETRON HCL INJECTION 0.25 MG/5ML
0.2500 mg | Freq: Once | INTRAVENOUS | Status: AC
  Administered 2016-05-05: 0.25 mg via INTRAVENOUS
  Filled 2016-05-05: qty 5

## 2016-05-05 MED ORDER — HEPARIN SOD (PORK) LOCK FLUSH 100 UNIT/ML IV SOLN
500.0000 [IU] | Freq: Once | INTRAVENOUS | Status: AC | PRN
  Administered 2016-05-05: 500 [IU]
  Filled 2016-05-05: qty 5

## 2016-05-05 MED ORDER — DIPHENHYDRAMINE HCL 50 MG/ML IJ SOLN
50.0000 mg | Freq: Once | INTRAMUSCULAR | Status: AC
  Administered 2016-05-05: 50 mg via INTRAVENOUS
  Filled 2016-05-05: qty 1

## 2016-05-05 MED ORDER — PACLITAXEL CHEMO INJECTION 300 MG/50ML
45.0000 mg/m2 | Freq: Once | INTRAVENOUS | Status: AC
  Administered 2016-05-05: 78 mg via INTRAVENOUS
  Filled 2016-05-05: qty 13

## 2016-05-05 MED ORDER — SODIUM CHLORIDE 0.9 % IV SOLN
20.0000 mg | Freq: Once | INTRAVENOUS | Status: AC
  Administered 2016-05-05: 20 mg via INTRAVENOUS
  Filled 2016-05-05: qty 2

## 2016-05-05 MED ORDER — FAMOTIDINE IN NACL 20-0.9 MG/50ML-% IV SOLN
20.0000 mg | Freq: Once | INTRAVENOUS | Status: AC
  Administered 2016-05-05: 20 mg via INTRAVENOUS
  Filled 2016-05-05: qty 50

## 2016-05-05 MED ORDER — SODIUM CHLORIDE 0.9 % IV SOLN
Freq: Once | INTRAVENOUS | Status: AC
  Administered 2016-05-05: 12:00:00 via INTRAVENOUS

## 2016-05-05 MED ORDER — SODIUM CHLORIDE 0.9% FLUSH
10.0000 mL | INTRAVENOUS | Status: DC | PRN
  Administered 2016-05-05: 10 mL
  Filled 2016-05-05: qty 10

## 2016-05-05 MED ORDER — SODIUM CHLORIDE 0.9 % IV SOLN
164.2000 mg | Freq: Once | INTRAVENOUS | Status: AC
  Administered 2016-05-05: 160 mg via INTRAVENOUS
  Filled 2016-05-05: qty 16

## 2016-05-05 NOTE — Patient Instructions (Signed)
[facility] Discharge Instructions for Patients Receiving Chemotherapy   Beginning January 23rd 2017 lab work for the Cancer Center will be done in the  Main lab at [place] on 1st floor. If you have a lab appointment with the Cancer Center please come in thru the  Main Entrance and check in at the main information desk   Today you received the following chemotherapy agents Taxol and Carboplatin. Follow-up as scheduled. Call clinic for any questions or concerns  To help prevent nausea and vomiting after your treatment, we encourage you to take your nausea medication.   If you develop nausea and vomiting, or diarrhea that is not controlled by your medication, call the clinic.  The clinic phone number is [phone]. Office hours are Monday-Friday 8:30am-5:00pm.  BELOW ARE SYMPTOMS THAT SHOULD BE REPORTED IMMEDIATELY:  *FEVER GREATER THAN 101.0 F  *CHILLS WITH OR WITHOUT FEVER  NAUSEA AND VOMITING THAT IS NOT CONTROLLED WITH YOUR NAUSEA MEDICATION  *UNUSUAL SHORTNESS OF BREATH  *UNUSUAL BRUISING OR BLEEDING  TENDERNESS IN MOUTH AND THROAT WITH OR WITHOUT PRESENCE OF ULCERS  *URINARY PROBLEMS  *BOWEL PROBLEMS  UNUSUAL RASH Items with * indicate a potential emergency and should be followed up as soon as possible. If you have an emergency after office hours please contact your primary care physician or go to the nearest emergency department.  Please call the clinic during office hours if you have any questions or concerns.   You may also contact the Patient Navigator at [phone] should you have any questions or need assistance in obtaining follow up care.      Resources For Cancer Patients and their Caregivers ? American Cancer Society: Can assist with transportation, wigs, general needs, runs Look Good Feel Better.        1-888-227-6333 ? Cancer Care: Provides financial assistance, online support groups, medication/co-pay assistance.   1-800-813-HOPE (4673) ? [facility] Assists [place] cancer patients and their families through emotional , educational and financial support.  336-427-4357 ? [place] DSS Where to apply for food stamps, Medicaid and utility assistance. 336-342-1394 ? RCATS: Transportation to medical appointments. 336-347-2287 ? Social Security Administration: May apply for disability if have a Stage IV cancer. 336-342-7796 1-800-772-1213 ? [place] Aging, Disability and Transit Services: Assists with nutrition, care and transit needs. 336-349-2343         

## 2016-05-05 NOTE — Progress Notes (Signed)
Susan Mahoney tolerated chemo tx well without issues. Labs results shown to [facility] including platelets 91 and chemo was approved for today per MD. Pt discharged via wheelchair in satisfactory condition with daughter

## 2016-05-06 ENCOUNTER — Ambulatory Visit
Admission: RE | Admit: 2016-05-06 | Discharge: 2016-05-06 | Disposition: A | Discharge status: Home / Self Care | Payer: Medicaid Other | Source: Ambulatory Visit | Attending: Radiation Oncology | Admitting: Radiation Oncology

## 2016-05-06 DIAGNOSIS — Z510 Encounter for antineoplastic radiation therapy: Secondary | ICD-10-CM | POA: Diagnosis not present

## 2016-05-07 ENCOUNTER — Encounter: Payer: Self-pay | Admitting: Radiation Oncology

## 2016-05-07 ENCOUNTER — Ambulatory Visit
Admission: RE | Admit: 2016-05-07 | Discharge: 2016-05-07 | Disposition: A | Discharge status: Home / Self Care | Payer: Self-pay | Source: Ambulatory Visit | Attending: Radiation Oncology | Admitting: Radiation Oncology

## 2016-05-07 ENCOUNTER — Ambulatory Visit
Admission: RE | Admit: 2016-05-07 | Discharge: 2016-05-07 | Disposition: A | Discharge status: Home / Self Care | Payer: Medicaid Other | Source: Ambulatory Visit | Attending: Radiation Oncology | Admitting: Radiation Oncology

## 2016-05-07 VITALS — BP 103/67 | HR 97 | Temp 98.3°F | Ht <= 58 in | Wt 168.6 lb

## 2016-05-07 DIAGNOSIS — Z510 Encounter for antineoplastic radiation therapy: Secondary | ICD-10-CM | POA: Diagnosis not present

## 2016-05-07 DIAGNOSIS — D649 Anemia, unspecified: Secondary | ICD-10-CM

## 2016-05-07 DIAGNOSIS — R918 Other nonspecific abnormal finding of lung field: Secondary | ICD-10-CM

## 2016-05-07 DIAGNOSIS — C3412 Malignant neoplasm of upper lobe, left bronchus or lung: Secondary | ICD-10-CM

## 2016-05-07 DIAGNOSIS — R7989 Other specified abnormal findings of blood chemistry: Secondary | ICD-10-CM

## 2016-05-07 DIAGNOSIS — E538 Deficiency of other specified B group vitamins: Secondary | ICD-10-CM

## 2016-05-07 MED ORDER — HYDROCODONE-ACETAMINOPHEN 5-325 MG PO TABS
1.0000 | ORAL_TABLET | ORAL | 0 refills | Status: DC | PRN
Start: 2016-05-07 — End: 2016-06-02

## 2016-05-07 NOTE — Progress Notes (Signed)
  Radiation Oncology         [phone]   Name: Susan Mahoney MRN: [id number]   Date: 05/07/2016  DOB: 12/26/1950   Weekly Radiation Therapy Management      ICD-9-CM ICD-10-CM   1. Pancoast tumor of left lung (HCC) 162.3 C34.12   2. Lung mass 786.6 R91.8 HYDROcodone-acetaminophen (NORCO) 5-325 MG tablet  3. Low vitamin B12 level 266.2 E53.8 HYDROcodone-acetaminophen (NORCO) 5-325 MG tablet  4. Symptomatic anemia 285.9 D64.9 HYDROcodone-acetaminophen (NORCO) 5-325 MG tablet    Current Dose: 56 Gy  Planned Dose:  66 Gy  Narrative The patient is here for weekly assessment.   Susan Mahoney is here for her 28th fraction of radiation to her Left lung. She denies pain at this time. However she is taking Norco about once a day for throat pain when swallowing. She is asking for a refill of this medication today. She is eating softer foods and chewing food well to decrease her throat pain when swallowing. She is also using carafate which was prescribed last week. She reports an occasional cough with white / blood tinged sputum after chemotherapy. She denies any SOB at this time. The skin over the radiation site is hyperpigmented and she is using the cream that was provided. She reports occasional acid reflux after she eats certain foods. She is eating well per her report, but tells me after a blood transfusion she notices that she loses some weight due to appetite change and spits up some blood. She continues to experience intense shoulder pain occasionally, though it no longer radiates into her arm pit like it used to. Notes if she takes her pain medication regularly then she does not experience this pain.   The patient is without complaint. Set-up films were reviewed. The chart was checked.  Physical Findings  height is '4\' 8"'$  (1.422 m) and weight is 168 lb 9.6 oz (76.5 kg). Her temperature is 98.3 F (36.8 C). Her blood pressure is 103/67 and her pulse is 97. Her oxygen saturation is 98%. . Weight  essentially stable.  No significant changes.  Impression The patient is tolerating radiation.  Plan Continue treatment as planned. I have refilled the patient's Norco today.         Sheral Apley Tammi Klippel, M.D.    This document serves as a record of services personally performed by Tyler Pita, MD. It was created on his behalf by Arlyce Harman, a trained medical scribe. The creation of this record is based on the scribe's personal observations and the provider's statements to them. This document has been checked and approved by the attending provider.

## 2016-05-07 NOTE — Progress Notes (Signed)
Susan Mahoney is here for her 28th fraction of radiation to her Left Lung. She denies pain at this time. However she is taking Norco about once a day for throat pain when swallowing . She is asking for a refill of this medicine today. She is eating softer foods and chewing her food well to decrease her throat pain when swallowing. She is also using Carafate which was prescribed last week. She reports an occasional cough with white/blood tinged sputum after chemotherapy. She denies any shortness of breath at this time. The skin over her Radiation site is hyperpigmented and she is using the cream that was provided. She reports occasional acid reflux after she eats certain foods. She is eating well per her report, but tells me after a blood transfusion she notices that she loses some weight.  BP 103/67   Pulse 97   Temp 98.3 F (36.8 C)   Ht '4\' 8"'$  (1.422 m)   Wt 168 lb 9.6 oz (76.5 kg)   SpO2 98% Comment: room air  BMI 37.80 kg/m    Wt Readings from Last 3 Encounters:  05/07/16 168 lb 9.6 oz (76.5 kg)  05/05/16 169 lb (76.7 kg)  05/01/16 176 lb 3.2 oz (79.9 kg)

## 2016-05-08 ENCOUNTER — Ambulatory Visit
Admission: RE | Admit: 2016-05-08 | Discharge: 2016-05-08 | Disposition: A | Discharge status: Home / Self Care | Payer: Medicaid Other | Source: Ambulatory Visit | Attending: Radiation Oncology | Admitting: Radiation Oncology

## 2016-05-08 DIAGNOSIS — Z510 Encounter for antineoplastic radiation therapy: Secondary | ICD-10-CM | POA: Diagnosis not present

## 2016-05-08 MED FILL — HYDROCODON-APAP 5-325: 5-325 | 10 days supply | Qty: 120 | Fill #0

## 2016-05-11 ENCOUNTER — Ambulatory Visit
Admission: RE | Admit: 2016-05-11 | Discharge: 2016-05-11 | Disposition: A | Discharge status: Home / Self Care | Payer: Medicaid Other | Source: Ambulatory Visit | Attending: Radiation Oncology | Admitting: Radiation Oncology

## 2016-05-11 DIAGNOSIS — Z510 Encounter for antineoplastic radiation therapy: Secondary | ICD-10-CM | POA: Diagnosis not present

## 2016-05-12 ENCOUNTER — Ambulatory Visit
Admission: RE | Admit: 2016-05-12 | Discharge: 2016-05-12 | Disposition: A | Discharge status: Home / Self Care | Payer: Medicaid Other | Source: Ambulatory Visit | Attending: Radiation Oncology | Admitting: Radiation Oncology

## 2016-05-12 ENCOUNTER — Encounter: Payer: Self-pay | Admitting: Dietician

## 2016-05-12 ENCOUNTER — Encounter (HOSPITAL_COMMUNITY): Payer: Self-pay | Admitting: Oncology

## 2016-05-12 ENCOUNTER — Encounter (HOSPITAL_BASED_OUTPATIENT_CLINIC_OR_DEPARTMENT_OTHER): Payer: Medicaid Other | Admitting: Oncology

## 2016-05-12 ENCOUNTER — Encounter: Payer: Self-pay | Admitting: *Deleted

## 2016-05-12 ENCOUNTER — Encounter (HOSPITAL_BASED_OUTPATIENT_CLINIC_OR_DEPARTMENT_OTHER): Payer: Medicaid Other

## 2016-05-12 VITALS — BP 95/62 | HR 122 | Temp 98.4°F | Resp 22 | Wt 165.0 lb

## 2016-05-12 VITALS — BP 101/60 | HR 109 | Temp 98.3°F | Resp 18

## 2016-05-12 DIAGNOSIS — E876 Hypokalemia: Secondary | ICD-10-CM

## 2016-05-12 DIAGNOSIS — R110 Nausea: Secondary | ICD-10-CM

## 2016-05-12 DIAGNOSIS — Z510 Encounter for antineoplastic radiation therapy: Secondary | ICD-10-CM | POA: Diagnosis not present

## 2016-05-12 DIAGNOSIS — C3412 Malignant neoplasm of upper lobe, left bronchus or lung: Secondary | ICD-10-CM

## 2016-05-12 DIAGNOSIS — D6481 Anemia due to antineoplastic chemotherapy: Secondary | ICD-10-CM

## 2016-05-12 DIAGNOSIS — T451X5A Adverse effect of antineoplastic and immunosuppressive drugs, initial encounter: Secondary | ICD-10-CM

## 2016-05-12 DIAGNOSIS — K2100 Gastro-esophageal reflux disease with esophagitis, without bleeding: Secondary | ICD-10-CM

## 2016-05-12 DIAGNOSIS — Z5111 Encounter for antineoplastic chemotherapy: Secondary | ICD-10-CM

## 2016-05-12 DIAGNOSIS — R530 Neoplastic (malignant) related fatigue: Secondary | ICD-10-CM

## 2016-05-12 LAB — PREPARE RBC (CROSSMATCH)

## 2016-05-12 LAB — CBC WITH DIFFERENTIAL/PLATELET
BASOS PCT: 1 %
Basophils Absolute: 0 10*3/uL (ref 0.0–0.1)
EOS PCT: 1 %
Eosinophils Absolute: 0 10*3/uL (ref 0.0–0.7)
HEMATOCRIT: 24.8 % — AB (ref 36.0–46.0)
HEMOGLOBIN: 7.8 g/dL — AB (ref 12.0–15.0)
Lymphocytes Relative: 27 %
Lymphs Abs: 0.5 10*3/uL — ABNORMAL LOW (ref 0.7–4.0)
MCH: 23.2 pg — ABNORMAL LOW (ref 26.0–34.0)
MCHC: 31.5 g/dL (ref 30.0–36.0)
MCV: 73.8 fL — ABNORMAL LOW (ref 78.0–100.0)
Monocytes Absolute: 0.2 10*3/uL (ref 0.1–1.0)
Monocytes Relative: 8 %
Neutro Abs: 1.3 10*3/uL — ABNORMAL LOW (ref 1.7–7.7)
Neutrophils Relative %: 63 %
Platelets: 140 10*3/uL — ABNORMAL LOW (ref 150–400)
RBC: 3.36 MIL/uL — AB (ref 3.87–5.11)
RDW: 23.7 % — AB (ref 11.5–15.5)
WBC: 2 10*3/uL — ABNORMAL LOW (ref 4.0–10.5)

## 2016-05-12 LAB — COMPREHENSIVE METABOLIC PANEL
ALBUMIN: 3 g/dL — AB (ref 3.5–5.0)
ALK PHOS: 113 U/L (ref 38–126)
ALT: 23 U/L (ref 14–54)
ANION GAP: 10 (ref 5–15)
AST: 19 U/L (ref 15–41)
BILIRUBIN TOTAL: 0.9 mg/dL (ref 0.3–1.2)
BUN: 25 mg/dL — AB (ref 6–20)
CO2: 27 mmol/L (ref 22–32)
Calcium: 9.2 mg/dL (ref 8.9–10.3)
Chloride: 99 mmol/L — ABNORMAL LOW (ref 101–111)
Creatinine, Ser: 1.23 mg/dL — ABNORMAL HIGH (ref 0.44–1.00)
GFR calc Af Amer: 53 mL/min — ABNORMAL LOW (ref >60)
GFR calc non Af Amer: 46 mL/min — ABNORMAL LOW (ref >60)
GLUCOSE: 198 mg/dL — AB (ref 65–99)
POTASSIUM: 3.3 mmol/L — AB (ref 3.5–5.1)
SODIUM: 136 mmol/L (ref 135–145)
TOTAL PROTEIN: 6.9 g/dL (ref 6.5–8.1)

## 2016-05-12 MED ORDER — PALONOSETRON HCL INJECTION 0.25 MG/5ML
0.2500 mg | Freq: Once | INTRAVENOUS | Status: AC
  Administered 2016-05-12: 0.25 mg via INTRAVENOUS
  Filled 2016-05-12: qty 5

## 2016-05-12 MED ORDER — HEPARIN SOD (PORK) LOCK FLUSH 100 UNIT/ML IV SOLN
500.0000 [IU] | Freq: Once | INTRAVENOUS | Status: AC | PRN
Start: 2016-05-12 — End: 2016-05-12
  Administered 2016-05-12: 500 [IU]

## 2016-05-12 MED ORDER — POTASSIUM CHLORIDE CRYS ER 20 MEQ PO TBCR: 20.0000 meq | EXTENDED_RELEASE_TABLET | Freq: Two times a day (BID) | ORAL | 2 refills | Status: DC

## 2016-05-12 MED ORDER — POTASSIUM CHLORIDE CRYS ER 20 MEQ PO TBCR
40.0000 meq | EXTENDED_RELEASE_TABLET | Freq: Once | ORAL | Status: AC
  Administered 2016-05-12: 40 meq via ORAL
  Filled 2016-05-12: qty 2

## 2016-05-12 MED ORDER — DIPHENHYDRAMINE HCL 50 MG/ML IJ SOLN
50.0000 mg | Freq: Once | INTRAMUSCULAR | Status: AC
  Administered 2016-05-12: 50 mg via INTRAVENOUS
  Filled 2016-05-12: qty 1

## 2016-05-12 MED ORDER — DEXTROSE 5 % IV SOLN
45.0000 mg/m2 | Freq: Once | INTRAVENOUS | Status: AC
  Administered 2016-05-12: 78 mg via INTRAVENOUS
  Filled 2016-05-12: qty 13

## 2016-05-12 MED ORDER — SODIUM CHLORIDE 0.9 % IV SOLN
167.0000 mg | Freq: Once | INTRAVENOUS | Status: AC
  Administered 2016-05-12: 170 mg via INTRAVENOUS
  Filled 2016-05-12: qty 17

## 2016-05-12 MED ORDER — SODIUM CHLORIDE 0.9 % IV SOLN
20.0000 mg | Freq: Once | INTRAVENOUS | Status: AC
  Administered 2016-05-12: 20 mg via INTRAVENOUS
  Filled 2016-05-12: qty 2

## 2016-05-12 MED ORDER — SODIUM CHLORIDE 0.9 % IV SOLN
Freq: Once | INTRAVENOUS | Status: AC
  Administered 2016-05-12: 11:00:00 via INTRAVENOUS

## 2016-05-12 MED ORDER — POTASSIUM CHLORIDE CRYS ER 20 MEQ PO TBCR: 40.0000 meq | EXTENDED_RELEASE_TABLET | Freq: Once | ORAL | Status: DC

## 2016-05-12 MED ORDER — PANTOPRAZOLE SODIUM 40 MG PO TBEC: 40.0000 mg | DELAYED_RELEASE_TABLET | Freq: Two times a day (BID) | ORAL | 3 refills | Status: DC

## 2016-05-12 MED ORDER — FAMOTIDINE IN NACL 20-0.9 MG/50ML-% IV SOLN
20.0000 mg | Freq: Once | INTRAVENOUS | Status: AC
Start: 2016-05-12 — End: 2016-05-12
  Administered 2016-05-12: 20 mg via INTRAVENOUS
  Filled 2016-05-12: qty 50

## 2016-05-12 MED ORDER — SODIUM CHLORIDE 0.9% FLUSH
10.0000 mL | INTRAVENOUS | Status: DC | PRN
  Administered 2016-05-12: 10 mL
  Filled 2016-05-12: qty 10

## 2016-05-12 NOTE — Progress Notes (Signed)
OK to treat with chemo per Dr.Penland. Will scheduled patient for a blood transfusion.  Tolerated chemo infusion well. Stable on discharge home with husband via wheelchair. Blue blood bank bracelet intact, patient and husband understand not to remove bracelet.

## 2016-05-12 NOTE — Progress Notes (Signed)
CSW identified that pt was asking for Additional Ensure. Had brief conversation with pt. Her weight is down 7 lbs since seen for the first time on  8/1 though she appears to have gained after that initial visit  She is drinking 1-2 a day to supplement her PO intake. She advised to continue to do this. RD will order. Pt will be more closely followed by pt to assess for any further loss and need for more education.   Burtis Junes RD, LDN, CNSC Clinical Nutrition Pager: [phone] 05/12/2016 11:14 AM

## 2016-05-12 NOTE — Patient Instructions (Signed)
[place] at [facility] Discharge Instructions  RECOMMENDATIONS MADE BY THE CONSULTANT AND ANY TEST RESULTS WILL BE SENT TO YOUR REFERRING PHYSICIAN.  You were seen by Gershon Mussel today. Protonix has been escribed Return in 2-3 weeks.  Please call the center with any related concerns.  Thank you for choosing [place] at [facility] to provide your oncology and hematology care.  To afford each patient quality time with our provider, please arrive at least 15 minutes before your scheduled appointment time.   Beginning January 23rd 2017 lab work for the [facility] will be done in the  Main lab at [facility] on 1st floor. If you have a lab appointment with the [facility] please come in thru the  Main Entrance and check in at the main information desk  You need to re-schedule your appointment should you arrive 10 or more minutes late.  We strive to give you quality time with our providers, and arriving late affects you and other patients whose appointments are after yours.  Also, if you no show three or more times for appointments you may be dismissed from the clinic at the providers discretion.     Again, thank you for choosing [facility].  Our hope is that these requests will decrease the amount of time that you wait before being seen by our physicians.       _____________________________________________________________  Should you have questions after your visit to [facility], please contact our office at [phone] between the hours of 8:30 a.m. and 4:30 p.m.  Voicemails left after 4:30 p.m. will not be returned until the following business day.  For prescription refill requests, have your pharmacy contact our office.         Resources For Cancer Patients and their Caregivers ? American Cancer Society: Can assist with transportation, wigs, general needs, runs Look Good Feel Better.         901-680-7889 ? Cancer Care: Provides financial assistance, online support groups, medication/co-pay assistance.  1-800-813-HOPE 513-195-2038) ? [facility] Assists [place] cancer patients and their families through emotional , educational and financial support.  579 231 0460 ? [place] DSS Where to apply for food stamps, Medicaid and utility assistance. 618-729-6819 ? RCATS: Transportation to medical appointments. 564-415-7262 ? Social Security Administration: May apply for disability if have a Stage IV cancer. (828)208-8108 (425)254-3867 ? [employer], Disability and Transit Services: Assists with nutrition, care and transit needs. [place] Support Programs: '@10RELATIVEDAYS'$ @ > Cancer Support Group  2nd Tuesday of the month 1pm-2pm, Journey Room  > Creative Journey  3rd Tuesday of the month 1130am-1pm, Journey Room  > Look Good Feel Better  1st Wednesday of the month 10am-12 noon, Journey Room (Call [place] to register [phone])

## 2016-05-12 NOTE — Patient Instructions (Signed)
[facility] Discharge Instructions for Patients Receiving Chemotherapy   Beginning January 23rd 2017 lab work for the [facility] will be done in the  Main lab at [facility] on 1st floor. If you have a lab appointment with the [facility] please come in thru the  Main Entrance and check in at the main information desk   Today you received the following chemotherapy agents Carbo and Taxol (week 6 of 6).  Blood transfusion Friday August 25,2017 at [facility].   If you develop nausea and vomiting, or diarrhea that is not controlled by your medication, call the clinic.  The clinic phone number is [phone]. Office hours are Monday-Friday 8:30am-5:00pm.  BELOW ARE SYMPTOMS THAT SHOULD BE REPORTED IMMEDIATELY:  *FEVER GREATER THAN 101.0 F  *CHILLS WITH OR WITHOUT FEVER  NAUSEA AND VOMITING THAT IS NOT CONTROLLED WITH YOUR NAUSEA MEDICATION  *UNUSUAL SHORTNESS OF BREATH  *UNUSUAL BRUISING OR BLEEDING  TENDERNESS IN MOUTH AND THROAT WITH OR WITHOUT PRESENCE OF ULCERS  *URINARY PROBLEMS  *BOWEL PROBLEMS  UNUSUAL RASH Items with * indicate a potential emergency and should be followed up as soon as possible. If you have an emergency after office hours please contact your primary care physician or go to the nearest emergency department.  Please call the clinic during office hours if you have any questions or concerns.   You may also contact the Patient Navigator at [phone] should you have any questions or need assistance in obtaining follow up care.      Resources For Cancer Patients and their Caregivers ? American Cancer Society: Can assist with transportation, wigs, general needs, runs Look Good Feel Better.        7275943966 ? Cancer Care: Provides financial assistance, online support groups, medication/co-pay assistance.  1-800-813-HOPE 775-542-8726) ? [facility] Assists [place] cancer patients and  their families through emotional , educational and financial support.  (562)066-0038 ? [place] DSS Where to apply for food stamps, Medicaid and utility assistance. (703)880-8884 ? RCATS: Transportation to medical appointments. 934-550-0179 ? Social Security Administration: May apply for disability if have a Stage IV cancer. (340)393-2548 669-674-1611 ? [employer], Disability and Transit Services: Assists with nutrition, care and transit needs. 248-518-5863

## 2016-05-12 NOTE — Progress Notes (Signed)
Susan Mahoney Clinical Social Work   Clinical Social Work was referred by [place] rounding. Clinical Social Worker met with patient at [facility] to offer support and assess for needs.  Pt shared today was her last chemo and she was quite excited to be done, although she feels pretty bad today. Pt had daughter with her today who has been supportive. Pt requested case of Ensure and CSW made referral to Burtis Junes, dietician for assistance. Pt completes radiation on Thursday. Pt denied other needs currently. CSW assisted pt with food assistance, Cancer Care and emotional support in the past. CSW encouraged pt to reach out to CSW if she thinks of other concerns or needs. CSW will continue to follow and will attempt to be present when pt gets to ring the bell. Pt aware to reach out if other needs arise.    Clinical Social Work interventions: Check in, reassess needs Referral  Loren Racer, [place] Tuesdays   [phone]

## 2016-05-12 NOTE — Assessment & Plan Note (Addendum)
Stage IIB poorly differentiated left pancoast tumor with direct invasion into the chest wall (T3N0M0), measuring 9.6 cm in largest dimension with PET scan on 03/12/2016 demonstrating direct chest wall invasion and findings suspicious for lymphangitic spread.  Biopsy unable identify phenotype but poorly differentiated.  Oncology history updated.  Pre-treatment labs: CBC diff, CMET.  I personally reviewed and went over laboratory results with the patient.  The results are noted within this dictation.  Labs satisfy treatment criteria.  HGB today is 7.8 g/dL and ANC is 1300.  We will transfuse her 2 units of PRBC this week.  Hypokalemia is noted as well and therefore 40 mEq will be given in clinic today and Rx for Kdur 20 mEq daily is escribed.  Today is her final chemotherapy session.  She is anticipating an end date for XRT on 05/14/2016.  She is encouraged to ring our Cancer Center's Jonnie Kind.  Discussed with Dr. Whitney Muse regarding the future treatment plan/management moving forward.  She would like to speak with Dr. Tammi Klippel ([place]) to help formulate ongoing cancer care moving forward.  She will tentatively return in 2-3 weeks for follow-up.

## 2016-05-12 NOTE — Progress Notes (Signed)
Susan Fairy, PA-C [address] [place] [address]  Pancoast tumor of left lung (HCC)  Reflux esophagitis - Plan: pantoprazole (PROTONIX) 40 MG tablet  Hypokalemia - Plan: potassium chloride SA (K-DUR,KLOR-CON) 20 MEQ tablet, DISCONTINUED: potassium chloride SA (K-DUR,KLOR-CON) CR tablet 40 mEq  Anemia due to antineoplastic chemotherapy - Plan: Practitioner attestation of consent, Complete patient signature process for consent form, Care order/instruction, 0.9 %  sodium chloride infusion, sodium chloride flush (NS) 0.9 % injection 10 mL, heparin lock flush 100 unit/mL, heparin lock flush 100 unit/mL, sodium chloride flush (NS) 0.9 % injection 3 mL, Type and screen, Transfuse RBC, acetaminophen (TYLENOL) tablet 650 mg, diphenhydrAMINE (BENADRYL) injection 25 mg, CANCELED: Prepare RBC  CURRENT THERAPY: Weekly Carboplatin/Paclitaxel with concurrent XRT beginning on 03/31/2016  INTERVAL HISTORY: Susan Mahoney 65 y.o. 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  Biopsy unable identify phenotype but poorly differentiated.     Pancoast tumor of left lung ([place])   02/16/2016 - 02/20/2016 Hospital Admission    Symptomatic anemia, lung mass      02/17/2016 Imaging    CT chest with 9.6 x 6.1 x 6.6 cm macrolobulated mass in LUL with evidence of direct chest wall invasion, surrounding opacities ? early lymphangitic spread. No mediastinal or hilar LAD. pleural based lesion R apex prob cystic present 09/19/2013      02/18/2016 Procedure    Colonoscopy with diverticulosis, five polyps      02/19/2016 Initial Biopsy    Ct biopsy in IR      02/19/2016 Pathology Results    poorly diff malignancy, unclear phenotype. carcinoma and melanoma  are ruled out, calretinin positive staining may be seen in mesotheliomas in addition to other tumors      02/19/2016 Procedure    EGD medium sized hiatal hernia, non obstructing schatzki ring, esophagitis      03/12/2016 PET scan    Hypermetabolic LUL lung mass with direct chest wall invasion. Lower level hypermetabolism within the surrounding ground-glass opacity and septal thickening, most consistent with lympangitic spread. No thoracic adenopathy.       03/20/2016 Procedure    Port placed      03/31/2016 - 05/12/2016 Chemotherapy    Weekly Carboplatin/Paclitaxel with XRT      03/31/2016 -  Radiation Therapy    XRT in [place] with Dr. Tammi Klippel for 33 fractions over 6.5 weeks.  Planned to be completed on 05/14/2016      04/28/2016 Imaging    Chest xray- Enlarging left apical mass no occupying about 50% of the left hemithorax, significantly increased from 02/19/2016. Probable bony destruction involving the left anterior second rib.      05/06/2016 Pathology Results    02/18/14 specimen- PDL1 is low positive with 10% tumor score      She is tolerating therapy well at this time.  Her only complaints are fatigue and nausea without vomiting. She is congratulated on nearing completion of current therapy.  Review of Systems  Constitutional: Positive for malaise/fatigue and weight loss. Negative for chills and fever.  HENT: Negative.   Eyes: Negative.  Negative for blurred vision and double vision.  Respiratory: Negative.  Negative for cough.   Cardiovascular: Negative.  Negative for chest pain.  Gastrointestinal: Positive for nausea.  Negative for abdominal pain, constipation, diarrhea and vomiting.  Genitourinary: Negative.   Musculoskeletal: Negative.   Skin: Negative.   Neurological: Positive for weakness. Negative for headaches.  Endo/Heme/Allergies: Negative.   Psychiatric/Behavioral: Negative.     Past Medical History:  Diagnosis Date  . Anemia   . Diabetes mellitus, type 2 ([place])     . Hypertension   . Low vitamin B12 level 02/28/2016  . Obesity   . Pancoast tumor of left lung ([place]) 03/03/2016  . Sinus infection   . Tobacco abuse     Past Surgical History:  Procedure Laterality Date  . ABDOMINAL HYSTERECTOMY    . COLONOSCOPY N/A 02/18/2016   Procedure: COLONOSCOPY;  Surgeon: Daneil Dolin, MD;  Location: [facility];  Service: Endoscopy;  Laterality: N/A;  . ESOPHAGOGASTRODUODENOSCOPY N/A 02/18/2016   Procedure: ESOPHAGOGASTRODUODENOSCOPY (EGD);  Surgeon: Daneil Dolin, MD;  Location: [facility] END[facility];  Service: Endoscopy;  Laterality: N/A;    Family History  Problem Relation Age of Onset  . Cancer Father     Social History   Social History  . Marital status: Married    Spouse name: N/A  . Number of children: N/A  . Years of education: N/A   Social History Main Topics  . Smoking status: Former Smoker    Packs/day: 0.50    Years: 30.00    Types: Cigarettes    Quit date: 03/13/2016  . Smokeless tobacco: Never Used  . Alcohol use No  . Drug use: No  . Sexual activity: No   Other Topics Concern  . None   Social History Narrative  . None     PHYSICAL EXAMINATION  ECOG PERFORMANCE STATUS: 1 - Symptomatic but completely ambulatory  Vitals:   05/12/16 0840  BP: 95/62  Pulse: (!) 122  Resp: (!) 22  Temp: 98.4 F (36.9 C)    GENERAL:alert, well nourished, well developed, comfortable, cooperative, obese, smiling and accompanied by her husband. SKIN: skin color, texture, turgor are normal, no rashes or significant lesions HEAD: Normocephalic, No masses, lesions, tenderness or abnormalities EYES: normal, EOMI, Conjunctiva are pink and non-injected EARS: External ears normal OROPHARYNX:lips, buccal mucosa, and tongue normal and mucous membranes are moist  NECK: supple, trachea midline LYMPH:  no palpable lymphadenopathy BREAST:not examined LUNGS: clear to auscultation  HEART: regular rate & rhythm ABDOMEN:abdomen soft and obese BACK: Back  symmetric, no curvature. EXTREMITIES:less then 2 second capillary refill, no joint deformities, effusion, or inflammation, no skin discoloration, no cyanosis  NEURO: alert & oriented x 3 with fluent speech, no focal motor/sensory deficits, gait normal   LABORATORY DATA: CBC    Component Value Date/Time   WBC 2.0 (L) 05/12/2016 0859   RBC 3.36 (L) 05/12/2016 0859   HGB 7.8 (L) 05/12/2016 0859   HCT 24.8 (L) 05/12/2016 0859   PLT 140 (L) 05/12/2016 0859   MCV 73.8 (L) 05/12/2016 0859   MCH 23.2 (L) 05/12/2016 0859   MCHC 31.5 05/12/2016 0859   RDW 23.7 (H) 05/12/2016 0859   LYMPHSABS 0.5 (L) 05/12/2016 0859   MONOABS 0.2 05/12/2016 0859   EOSABS 0.0 05/12/2016 0859   BASOSABS 0.0 05/12/2016 0859      Chemistry      Component Value Date/Time   NA 136 05/12/2016 0859   K 3.3 (L) 05/12/2016 0859   CL 99 (L) 05/12/2016 0859   CO2 27 05/12/2016 0859   BUN 25 (H) 05/12/2016 0859   CREATININE 1.23 (H) 05/12/2016 0093  Component Value Date/Time   CALCIUM 9.2 05/12/2016 0859   ALKPHOS 113 05/12/2016 0859   AST 19 05/12/2016 0859   ALT 23 05/12/2016 0859   BILITOT 0.9 05/12/2016 0859        PENDING LABS:   RADIOGRAPHIC STUDIES:  Dg Chest 2 View  Result Date: 04/28/2016 CLINICAL DATA:  Lung cancer, hemoptysis.  Pancoast tumor. EXAM: CHEST  2 VIEW COMPARISON:  Multiple exams, including 02/19/2016 FINDINGS: Enlarging Pancoast tumor now involves the top path of the left lung. Questionable indistinctness of the left anterior second rib, early bony destruction suspected. Small contralateral (right) apical mass is apparently chronic and spiculation on prior imaging workup was that this may represent a nerve sheath tumor. Mild but stable enlargement of the cardiopericardial silhouette noted with mild prominence of the central pulmonary vasculature. Atherosclerotic aortic arch. Power injectable right Port-A-Cath tip: SVC. Cephalization of blood flow noted.  Thoracic spondylosis.  IMPRESSION: 1. Enlarging left apical mass no occupying about 50% of the left hemithorax, significantly increased from 02/19/2016. Probable bony destruction involving the left anterior second rib. 2. Stable small mass medially at the contralateral (right) lung apex, speculated to potentially be a nerve sheath tumor on prior imaging, given the stability. 3. New right internal jugular power injectable Port-A-Cath, tip SVC. 4. Mild enlargement of the cardiopericardial silhouette with cephalization of blood flow favoring pulmonary venous hypertension. 5. Hilar prominence possibly from adenopathy or prominent pulmonary vasculature. Electronically Signed   By: Van Clines M.D.   On: 04/28/2016 13:36     PATHOLOGY:    ASSESSMENT AND PLAN:  Pancoast tumor of left lung ([place]) Stage IIB poorly differentiated left pancoast tumor with direct invasion into the chest wall (T3N0M0), measuring 9.6 cm in largest dimension with PET scan on 03/12/2016 demonstrating direct chest wall invasion and findings suspicious for lymphangitic spread.  Biopsy unable identify phenotype but poorly differentiated.  Oncology history updated.  Pre-treatment labs: CBC diff, CMET.  I personally reviewed and went over laboratory results with the patient.  The results are noted within this dictation.  Labs satisfy treatment criteria.  HGB today is 7.8 g/dL and ANC is 1300.  We will transfuse her 2 units of PRBC this week.  Hypokalemia is noted as well and therefore 40 mEq will be given in clinic today and Rx for Kdur 20 mEq daily is escribed.  Today is her final chemotherapy session.  She is anticipating an end date for XRT on 05/14/2016.  She is encouraged to ring our Cancer Center's Jonnie Kind.  Discussed with Dr. Whitney Muse regarding the future treatment plan/management moving forward.  She would like to speak with Dr. Tammi Klippel ([place]) to help formulate ongoing cancer care moving forward.  She will tentatively return in 2-3 weeks  for follow-up.   ORDERS PLACED FOR THIS ENCOUNTER: Orders Placed This Encounter  Procedures  . Practitioner attestation of consent  . Complete patient signature process for consent form  . Care order/instruction  . Type and screen    MEDICATIONS PRESCRIBED THIS ENCOUNTER: Meds ordered this encounter  Medications  . pantoprazole (PROTONIX) 40 MG tablet    Sig: Take 1 tablet (40 mg total) by mouth 2 (two) times daily before a meal.    Dispense:  60 tablet    Refill:  3    Order Specific Question:   Supervising Provider    Answer:   Patrici Ranks [id number]  . DISCONTD: potassium chloride SA (K-DUR,KLOR-CON) CR tablet 40 mEq  . potassium chloride SA (  K-DUR,KLOR-CON) 20 MEQ tablet    Sig: Take 1 tablet (20 mEq total) by mouth 2 (two) times daily.    Dispense:  30 tablet    Refill:  2    Order Specific Question:   Supervising Provider    Answer:   Patrici Ranks [id number]    THERAPY PLAN:  Treatment as outlined above.  All questions were answered. The patient knows to call the clinic with any problems, questions or concerns. We can certainly see the patient much sooner if necessary.  Patient and plan discussed with Dr. Ancil Linsey and she is in agreement with the aforementioned.   This note is electronically signed by: Doy Mince 05/12/2016 6:13 PM

## 2016-05-13 ENCOUNTER — Other Ambulatory Visit (HOSPITAL_COMMUNITY): Payer: Self-pay

## 2016-05-13 ENCOUNTER — Ambulatory Visit
Admission: RE | Admit: 2016-05-13 | Discharge: 2016-05-13 | Disposition: A | Discharge status: Home / Self Care | Payer: Medicaid Other | Source: Ambulatory Visit | Attending: Radiation Oncology | Admitting: Radiation Oncology

## 2016-05-13 DIAGNOSIS — Z510 Encounter for antineoplastic radiation therapy: Secondary | ICD-10-CM | POA: Diagnosis not present

## 2016-05-14 ENCOUNTER — Ambulatory Visit
Admission: RE | Admit: 2016-05-14 | Discharge: 2016-05-14 | Disposition: A | Discharge status: Home / Self Care | Payer: Self-pay | Source: Ambulatory Visit | Attending: Radiation Oncology | Admitting: Radiation Oncology

## 2016-05-14 ENCOUNTER — Encounter: Payer: Self-pay | Admitting: Radiation Oncology

## 2016-05-14 ENCOUNTER — Ambulatory Visit
Admission: RE | Admit: 2016-05-14 | Discharge: 2016-05-14 | Disposition: A | Discharge status: Home / Self Care | Payer: Medicaid Other | Source: Ambulatory Visit | Attending: Radiation Oncology | Admitting: Radiation Oncology

## 2016-05-14 VITALS — BP 109/75 | HR 115 | Temp 98.5°F | Resp 18 | Ht <= 58 in | Wt 165.6 lb

## 2016-05-14 DIAGNOSIS — C3412 Malignant neoplasm of upper lobe, left bronchus or lung: Secondary | ICD-10-CM

## 2016-05-14 DIAGNOSIS — Z510 Encounter for antineoplastic radiation therapy: Secondary | ICD-10-CM | POA: Diagnosis not present

## 2016-05-14 NOTE — Progress Notes (Signed)
  Radiation Oncology         [phone]   Name: Susan Mahoney MRN: [id number]   Date: 05/14/2016  DOB: 26-Apr-1951   Weekly Radiation Therapy Management      ICD-9-CM ICD-10-CM   1. Pancoast tumor of left lung (HCC) 162.3 C34.12     Current Dose: 66 Gy  Planned Dose:  66 Gy  Narrative The patient is here for weekly assessment.   Weight and vitals stable. Denies pain. Reports frequency and intensity of cough are less. Reports in the morning her cough is productive with clear sputum that is blood tinged. Reports occasional difficulty swallowing. Requesting medication to manage difficulty swallowing. Confirms overall breathing is better. Reports SOB continues to improve. The patient was encouraged to use radiaplex bid as directed.  Set-up films were reviewed. The chart was checked.  Physical Findings  height is '4\' 8"'$  (1.422 m) and weight is 165 lb 9.6 oz (75.1 kg). Her oral temperature is 98.5 F (36.9 C). Her blood pressure is 109/75 and her pulse is 115 (abnormal). Her respiration is 18 and oxygen saturation is 100%. . Weight essentially stable.  Erythema and hyperpigmentation without desquamation noted in the treatment field.   Impression The patient is tolerating radiation.  Plan The patient has completed radiation treatment. She was given a 1 month appointment card for follow up.          Sheral Apley Tammi Klippel, M.D.    This document serves as a record of services personally performed by Tyler Pita, MD. It was created on his behalf by Bethann Humble, a trained medical scribe. The creation of this record is based on the scribe's personal observations and the provider's statements to them. This document has been checked and approved by the attending provider.

## 2016-05-14 NOTE — Progress Notes (Signed)
Weight and vitals stable. Denies pain.  Reports  frequency and intensity of cough are less. Reports in the morning her cough is productive with clear sputum that is blood tinged. Reports occasional difficulty swallowing. Requesting medication to manage difficulty swallowing. Confirms overall she is breathing better. Reports SOB continues to improve. Hyperpigmentation without desquamation within treatment field. Encouraged patient to use radiaplex bid as directed. Patient verbalized understanding.  EOT instruction given to continue to use Radiaplex gel for two more weeks then purchase a lotion with vitamin E.  Card given to dee Dr. Tammi Klippel in one month. Wt Readings from Last 3 Encounters:  05/14/16 165 lb 9.6 oz (75.1 kg)  05/12/16 165 lb (74.8 kg)  05/07/16 168 lb 9.6 oz (76.5 kg)  BP 109/75 (BP Location: Right Arm, Patient Position: Sitting, Cuff Size: Normal)   Pulse (!) 115   Temp 98.5 F (36.9 C) (Oral)   Resp 18   Ht '4\' 8"'$  (1.422 m)   Wt 165 lb 9.6 oz (75.1 kg)   SpO2 100%   BMI 37.13 kg/m

## 2016-05-15 ENCOUNTER — Encounter (HOSPITAL_COMMUNITY)
Admission: RE | Admit: 2016-05-15 | Discharge: 2016-05-15 | Disposition: A | Discharge status: Home / Self Care | Payer: Medicaid Other | Source: Ambulatory Visit | Attending: Hematology & Oncology | Admitting: Hematology & Oncology

## 2016-05-15 ENCOUNTER — Other Ambulatory Visit (HOSPITAL_COMMUNITY): Payer: Self-pay | Admitting: Oncology

## 2016-05-15 DIAGNOSIS — C3412 Malignant neoplasm of upper lobe, left bronchus or lung: Secondary | ICD-10-CM | POA: Diagnosis not present

## 2016-05-15 DIAGNOSIS — D649 Anemia, unspecified: Secondary | ICD-10-CM

## 2016-05-15 DIAGNOSIS — D6481 Anemia due to antineoplastic chemotherapy: Secondary | ICD-10-CM

## 2016-05-15 DIAGNOSIS — T451X5A Adverse effect of antineoplastic and immunosuppressive drugs, initial encounter: Principal | ICD-10-CM

## 2016-05-15 MED ORDER — SODIUM CHLORIDE 0.9% FLUSH: 3.0000 mL | INTRAVENOUS | Status: DC | PRN

## 2016-05-15 MED ORDER — ACETAMINOPHEN 325 MG PO TABS
ORAL_TABLET | ORAL | Status: AC
  Filled 2016-05-15: qty 2

## 2016-05-15 MED ORDER — DIPHENHYDRAMINE HCL 50 MG/ML IJ SOLN
INTRAMUSCULAR | Status: AC
  Filled 2016-05-15: qty 1

## 2016-05-15 MED ORDER — DIPHENHYDRAMINE HCL 50 MG/ML IJ SOLN
25.0000 mg | Freq: Once | INTRAMUSCULAR | Status: AC
  Administered 2016-05-15: 25 mg via INTRAVENOUS

## 2016-05-15 MED ORDER — HEPARIN SOD (PORK) LOCK FLUSH 100 UNIT/ML IV SOLN
INTRAVENOUS | Status: AC
  Filled 2016-05-15: qty 5

## 2016-05-15 MED ORDER — SODIUM CHLORIDE 0.9 % IV SOLN
250.0000 mL | Freq: Once | INTRAVENOUS | Status: AC
  Administered 2016-05-15: 250 mL via INTRAVENOUS

## 2016-05-15 MED ORDER — HEPARIN SOD (PORK) LOCK FLUSH 100 UNIT/ML IV SOLN: 250.0000 [IU] | INTRAVENOUS | Status: DC | PRN

## 2016-05-15 MED ORDER — SODIUM CHLORIDE 0.9% FLUSH: 10.0000 mL | INTRAVENOUS | Status: DC | PRN

## 2016-05-15 MED ORDER — HEPARIN SOD (PORK) LOCK FLUSH 100 UNIT/ML IV SOLN
500.0000 [IU] | Freq: Every day | INTRAVENOUS | Status: AC | PRN
  Administered 2016-05-15: 500 [IU]

## 2016-05-15 MED ORDER — ACETAMINOPHEN 325 MG PO TABS
650.0000 mg | ORAL_TABLET | Freq: Once | ORAL | Status: AC
Start: 2016-05-15 — End: 2016-05-15
  Administered 2016-05-15: 650 mg via ORAL

## 2016-05-16 LAB — TYPE AND SCREEN
ABO/RH(D): O POS
ANTIBODY SCREEN: NEGATIVE
UNIT DIVISION: 0
Unit division: 0

## 2016-05-17 NOTE — Progress Notes (Signed)
  Radiation Oncology         [phone] ________________________________  Name: Susan Mahoney MRN: [id number]  Date: 05/14/2016  DOB: 1951-01-23  End of Treatment Note  Diagnosis:   65 yo woman with T3 N0 PD NSCLC with Left chest wall invasion     Indication for treatment:  Curative       Radiation treatment dates:   03/31/16-05/14/16  Site/dose:   Left Lung Tumor to 66 Gy in 33 fractions  Beams/energy:   6 MV and 10 MV X-rays // 3D Plan  Narrative: The patient tolerated radiation treatment relatively well.   Denied pain. Reported frequency and intensity of cough were less. Reported in the morning her cough was productive with clear sputum that is blood tinged. Reported occasional difficulty swallowing. Requested medication to manage difficulty swallowing. Confirmed overall breathing is better. Reported SOB continues to improve. The patient was encouraged to use radiaplex bid as directed  Plan: The patient has completed radiation treatment. The patient will return to radiation oncology clinic for routine followup in one month. I advised her to call or return sooner if she has any questions or concerns related to her recovery or treatment. ________________________________  Sheral Apley. Tammi Klippel, M.D.

## 2016-05-29 ENCOUNTER — Telehealth: Payer: Self-pay | Admitting: *Deleted

## 2016-05-29 NOTE — Telephone Encounter (Signed)
On 05-29-16 fax  Medical records to [facility] it was consult note, end of tx note

## 2016-06-01 ENCOUNTER — Ambulatory Visit (HOSPITAL_COMMUNITY): Payer: Self-pay | Admitting: Hematology & Oncology

## 2016-06-02 ENCOUNTER — Other Ambulatory Visit: Payer: Self-pay | Admitting: Radiation Oncology

## 2016-06-02 ENCOUNTER — Telehealth: Payer: Self-pay | Admitting: Radiation Oncology

## 2016-06-02 DIAGNOSIS — E538 Deficiency of other specified B group vitamins: Secondary | ICD-10-CM

## 2016-06-02 DIAGNOSIS — D649 Anemia, unspecified: Secondary | ICD-10-CM

## 2016-06-02 DIAGNOSIS — R918 Other nonspecific abnormal finding of lung field: Secondary | ICD-10-CM

## 2016-06-02 MED ORDER — HYDROCODONE-ACETAMINOPHEN 5-325 MG PO TABS: 1.0000 | ORAL_TABLET | ORAL | 0 refills | Status: DC | PRN

## 2016-06-02 NOTE — Telephone Encounter (Signed)
Ms. Haning wanted pain medication and I have let Bryson Ha know. Bryson Ha tried to write out a rx, but the patient stated that she usually gets this filled over the phone at the [facility]. I will ask Sam tomorrow, 9/13, and the patient is agreeable to this. Ms. Madariaga also stated that she has a grant to get her meds free, so this will also be looked into for her and a return call made.

## 2016-06-03 ENCOUNTER — Telehealth: Payer: Self-pay | Admitting: Radiation Oncology

## 2016-06-03 NOTE — Telephone Encounter (Signed)
Phoned patient informing her Norco script is ready for pick up in radiation oncology nursing station. Explained that prescription for controlled substance must be picked up and signed for. Also, explained the lung cancer grant only covered her prescription cost while she was actively under treatment. Went onto explain that since she is no longer receiving treatment she will have to pay out of pocket to have the norco filled. Patient verbalized understanding of all discussed.

## 2016-06-10 ENCOUNTER — Encounter (HOSPITAL_COMMUNITY): Payer: Medicaid Other

## 2016-06-10 ENCOUNTER — Encounter (HOSPITAL_COMMUNITY): Payer: Self-pay | Admitting: Hematology & Oncology

## 2016-06-10 ENCOUNTER — Encounter (HOSPITAL_COMMUNITY): Payer: Medicaid Other | Attending: Hematology & Oncology | Admitting: Hematology & Oncology

## 2016-06-10 VITALS — BP 105/51 | HR 112 | Temp 98.0°F | Resp 16 | Wt 159.6 lb

## 2016-06-10 DIAGNOSIS — Z95828 Presence of other vascular implants and grafts: Secondary | ICD-10-CM

## 2016-06-10 DIAGNOSIS — Z9889 Other specified postprocedural states: Secondary | ICD-10-CM | POA: Insufficient documentation

## 2016-06-10 DIAGNOSIS — Z90710 Acquired absence of both cervix and uterus: Secondary | ICD-10-CM | POA: Diagnosis not present

## 2016-06-10 DIAGNOSIS — I10 Essential (primary) hypertension: Secondary | ICD-10-CM | POA: Insufficient documentation

## 2016-06-10 DIAGNOSIS — Z23 Encounter for immunization: Secondary | ICD-10-CM

## 2016-06-10 DIAGNOSIS — R634 Abnormal weight loss: Secondary | ICD-10-CM

## 2016-06-10 DIAGNOSIS — E118 Type 2 diabetes mellitus with unspecified complications: Secondary | ICD-10-CM | POA: Insufficient documentation

## 2016-06-10 DIAGNOSIS — F17210 Nicotine dependence, cigarettes, uncomplicated: Secondary | ICD-10-CM | POA: Insufficient documentation

## 2016-06-10 DIAGNOSIS — R042 Hemoptysis: Secondary | ICD-10-CM

## 2016-06-10 DIAGNOSIS — D649 Anemia, unspecified: Secondary | ICD-10-CM

## 2016-06-10 DIAGNOSIS — Z8571 Personal history of Hodgkin lymphoma: Secondary | ICD-10-CM | POA: Insufficient documentation

## 2016-06-10 DIAGNOSIS — R630 Anorexia: Secondary | ICD-10-CM

## 2016-06-10 DIAGNOSIS — C3412 Malignant neoplasm of upper lobe, left bronchus or lung: Secondary | ICD-10-CM

## 2016-06-10 LAB — COMPREHENSIVE METABOLIC PANEL
ALK PHOS: 60 U/L (ref 38–126)
ALT: 7 U/L — AB (ref 14–54)
ANION GAP: 5 (ref 5–15)
AST: 13 U/L — ABNORMAL LOW (ref 15–41)
Albumin: 2.7 g/dL — ABNORMAL LOW (ref 3.5–5.0)
BILIRUBIN TOTAL: 0.8 mg/dL (ref 0.3–1.2)
BUN: 18 mg/dL (ref 6–20)
CALCIUM: 8.8 mg/dL — AB (ref 8.9–10.3)
CO2: 27 mmol/L (ref 22–32)
CREATININE: 0.98 mg/dL (ref 0.44–1.00)
Chloride: 105 mmol/L (ref 101–111)
Glucose, Bld: 167 mg/dL — ABNORMAL HIGH (ref 65–99)
Potassium: 3.3 mmol/L — ABNORMAL LOW (ref 3.5–5.1)
SODIUM: 137 mmol/L (ref 135–145)
TOTAL PROTEIN: 6.6 g/dL (ref 6.5–8.1)

## 2016-06-10 LAB — CBC WITH DIFFERENTIAL/PLATELET
Basophils Absolute: 0 10*3/uL (ref 0.0–0.1)
Basophils Relative: 0 %
EOS ABS: 0.1 10*3/uL (ref 0.0–0.7)
Eosinophils Relative: 2 %
HEMATOCRIT: 23.8 % — AB (ref 36.0–46.0)
HEMOGLOBIN: 7.1 g/dL — AB (ref 12.0–15.0)
LYMPHS ABS: 0.9 10*3/uL (ref 0.7–4.0)
LYMPHS PCT: 15 %
MCH: 25 pg — AB (ref 26.0–34.0)
MCHC: 29.8 g/dL — AB (ref 30.0–36.0)
MCV: 83.8 fL (ref 78.0–100.0)
MONOS PCT: 12 %
Monocytes Absolute: 0.8 10*3/uL (ref 0.1–1.0)
NEUTROS PCT: 71 %
Neutro Abs: 4.5 10*3/uL (ref 1.7–7.7)
Platelets: 222 10*3/uL (ref 150–400)
RBC: 2.84 MIL/uL — AB (ref 3.87–5.11)
RDW: 23.6 % — ABNORMAL HIGH (ref 11.5–15.5)
WBC: 6.4 10*3/uL (ref 4.0–10.5)

## 2016-06-10 LAB — PREPARE RBC (CROSSMATCH)

## 2016-06-10 MED ORDER — LEVOFLOXACIN 250 MG PO TABS: ORAL_TABLET | ORAL | 0 refills | Status: DC

## 2016-06-10 MED ORDER — HEPARIN SOD (PORK) LOCK FLUSH 100 UNIT/ML IV SOLN
INTRAVENOUS | Status: AC
  Filled 2016-06-10: qty 5

## 2016-06-10 MED ORDER — INFLUENZA VAC SPLIT QUAD 0.5 ML IM SUSY
0.5000 mL | PREFILLED_SYRINGE | Freq: Once | INTRAMUSCULAR | Status: AC
  Administered 2016-06-10: 0.5 mL via INTRAMUSCULAR
  Filled 2016-06-10: qty 0.5

## 2016-06-10 MED ORDER — MEGESTROL ACETATE 400 MG/10ML PO SUSP: 400.0000 mg | Freq: Every day | ORAL | 1 refills | Status: AC

## 2016-06-10 MED ORDER — HEPARIN SOD (PORK) LOCK FLUSH 100 UNIT/ML IV SOLN
500.0000 [IU] | Freq: Once | INTRAVENOUS | Status: AC
  Administered 2016-06-10: 500 [IU] via INTRAVENOUS

## 2016-06-10 MED ORDER — SODIUM CHLORIDE 0.9% FLUSH
10.0000 mL | INTRAVENOUS | Status: DC | PRN
  Administered 2016-06-10: 10 mL via INTRAVENOUS
  Filled 2016-06-10: qty 10

## 2016-06-10 NOTE — Progress Notes (Signed)
Susan Mahoney presented for Portacath access and flush.  Portacath located right chest wall accessed with  H 20 needle.  Good blood return present. Portacath flushed with 6m NS and 500U/566mHeparin and needle removed intact.  Fluarix administration without incident; see MAR for injection details.  Patient tolerated procedures well and without incident.  No questions or complaints noted at this time.

## 2016-06-10 NOTE — Patient Instructions (Addendum)
[place] [facility] Discharge Instructions  RECOMMENDATIONS MADE BY THE CONSULTANT AND ANY TEST RESULTS WILL BE SENT TO YOUR REFERRING PHYSICIAN.  You saw Dr. Whitney Muse today.  You need 2 units of PRBCs tomorrow.  Dr. Servando Snare office to call you for appointment on Friday.  Return to clinic after CT scan 06/26/16.  Levaquin '250mg'$  tablet. Take 2 tablets today then 1 tablet daily x 10 days.   Megestrol acetate. Take as directed to increase appetite. Do not stop taking this if you begin to eat. The Megestrol is the reason you are eating.    Thank you for choosing [place] [facility] to provide your oncology and hematology care.  To afford each patient quality time with our provider, please arrive at least 15 minutes before your scheduled appointment time.   Beginning January 23rd 2017 lab work for the [facility] will be done in the  Main lab at [facility] on 1st floor. If you have a lab appointment with the [facility] please come in thru the  Main Entrance and check in at the main information desk  You need to re-schedule your appointment should you arrive 10 or more minutes late.  We strive to give you quality time with our providers, and arriving late affects you and other patients whose appointments are after yours.  Also, if you no show three or more times for appointments you may be dismissed from the clinic at the providers discretion.     Again, thank you for choosing [facility].  Our hope is that these requests will decrease the amount of time that you wait before being seen by our physicians.       _____________________________________________________________  Should you have questions after your visit to [facility], please contact our office at [phone] between the hours of 8:30 a.m. and 4:30 p.m.  Voicemails left after 4:30 p.m. will not be returned until the following business day.  For  prescription refill requests, have your pharmacy contact our office.         Resources For Cancer Patients and their Caregivers ? American Cancer Society: Can assist with transportation, wigs, general needs, runs Look Good Feel Better.        (862)402-9268 ? Cancer Care: Provides financial assistance, online support groups, medication/co-pay assistance.  1-800-813-HOPE 8637319421) ? [place] Assists [place] cancer patients and their families through emotional , educational and financial support.  (401)079-1944 ? [place] DSS Where to apply for food stamps, Medicaid and utility assistance. (660)421-9300 ? [facility]: Transportation to medical appointments. 669-050-0709 ? Social Security Administration: May apply for disability if have a Stage IV cancer. 2256731426 (909)703-1642 ? [employer], Disability and Transit Services: Assists with nutrition, care and transit needs. [place] Support Programs: '@10RELATIVEDAYS'$ @ > Cancer Support Group  2nd Tuesday of the month 1pm-2pm, Journey Room  > Creative Journey  3rd Tuesday of the month 1130am-1pm, Journey Room  > Look Good Feel Better  1st Wednesday of the month 10am-12 noon, Journey Room (Call [place] to register [phone])

## 2016-06-10 NOTE — Progress Notes (Signed)
[facility]  Progress Note  Patient Care Team: [name], PA-C as PCP - General (Physician Assistant) Daneil Dolin, MD as Consulting Physician (Gastroenterology)  CHIEF COMPLAINTS:     Pancoast tumor of left lung [facility])   02/16/2016 - 02/20/2016 Hospital Admission    Symptomatic anemia, lung mass      02/17/2016 Imaging    CT chest with 9.6 x 6.1 x 6.6 cm macrolobulated mass in LUL with evidence of direct chest wall invasion, surrounding opacities ? early lymphangitic spread. No mediastinal or hilar LAD. pleural based lesion R apex prob cystic present 09/19/2013      02/18/2016 Procedure    Colonoscopy with diverticulosis, five polyps      02/19/2016 Initial Biopsy    Ct biopsy in IR      02/19/2016 Pathology Results    poorly diff malignancy, unclear phenotype. carcinoma and melanoma are ruled out, calretinin positive staining may be seen in mesotheliomas in addition to other tumors      02/19/2016 Procedure    EGD medium sized hiatal hernia, non obstructing schatzki ring, esophagitis      03/12/2016 PET scan    Hypermetabolic LUL lung mass with direct chest wall invasion. Lower level hypermetabolism within the surrounding ground-glass opacity and septal thickening, most consistent with lympangitic spread. No thoracic adenopathy.       03/20/2016 Procedure    Port placed      03/31/2016 - 05/12/2016 Chemotherapy    Weekly Carboplatin/Paclitaxel with XRT      03/31/2016 -  Radiation Therapy    XRT in [place] with Dr. Tammi Klippel for 33 fractions over 6.5 weeks.  Planned to be completed on 05/14/2016      04/28/2016 Imaging    Chest xray- Enlarging left apical mass no occupying about 50% of the left hemithorax, significantly increased from 02/19/2016. Probable bony destruction involving the left anterior second rib.      05/06/2016 Pathology Results    02/18/14 specimen- PDL1 is low positive with 10% tumor score      05/13/2016 Pathology Results   [facility]- Alterations identified: CDKN2A/B loss, SLIT2 G715, TP53 P151T.  Additional findings: MS-Stable, tumor mutation burden- intermediate (11Muts/Mb).      06/11/2016 Imaging    CT chest- Increased size of left lung apex mass with persistent invasion into the lateral chest wall. 2. Worsening ground-glass opacity within the left lung, now including most of the left upper lobe and lingula, likely indicating progression of lymphangitic tumor spread. 3. New area of ground-glass opacity in the right upper lobe is also concerning for lymphangitic tumor spread. 4. Slight increase in size of right apical mass.      06/11/2016 Progression    CT scan of chest demonstrates lymphangitic spread.       HISTORY OF PRESENTING ILLNESS:  Susan Mahoney 65 y.o. female is here for follow-up of pancoast tumor of the L lung.   She has completed concurrent carboplatin/taxol and XRT.   Ms. Veale is accompanied by her husband and daughter.   When asked about her weight loss, the patient states she has a taste for food but then when she gets the food she eats a little and no longer has a taste for it. She tries to eat little meals throughout the day and has tried nutritional drinks including Ensure. She drinks an Ensure some days.   When asked if she feels depressed, she states she just feels weak and tired all the time. Her  daughter and husband agree that Ms. Tewell smiles - stating that this past week she was perky. The main concern is the appetite loss.  She reports intermittent nausea. She has anti-nausea pills but has not felt the need to take them. She typically becomes nauseated in the morning and before going to bed. She denies vomiting.   She continues to cough up blood and believes it is getting worse, "it comes and goes and don't be like overwhelming". She describes the color as bright red. This happens every day and several times a day. She keeps a spit cup with her at all times because  she feels congested and spits up blood. It is mucous with a little bit of blood in it and sometimes it is thick and looks like just blood. This hemoptysis sometimes wakes her up at night when she feels congested. This hemoptysis got worse with radiation treatment - her final radiation treatment was 05/14/2016.  The last time she coughed up blood was this morning and was about a teaspoon of blood, "like spitting tobacco".   She still experiences some pain in her left chest wall and will take a pain pill sometimes. Occasionally it feels like someone is pinching her back. She no longer feels the prior shoulder pain. She feels her pain is overall improved.   She denies fever or chills.   MEDICAL HISTORY:  Past Medical History:  Diagnosis Date  . Anemia   . Diabetes mellitus, type 2 ([place])   . Hypertension   . Low vitamin B12 level 02/28/2016  . Obesity   . Pancoast tumor of left lung ([place]) 03/03/2016  . Sinus infection   . Tobacco abuse     SURGICAL HISTORY: Past Surgical History:  Procedure Laterality Date  . ABDOMINAL HYSTERECTOMY    . COLONOSCOPY N/A 02/18/2016   Procedure: COLONOSCOPY;  Surgeon: Daneil Dolin, MD;  Location: [facility] ENDO SUITE;  Service: Endoscopy;  Laterality: N/A;  . ESOPHAGOGASTRODUODENOSCOPY N/A 02/18/2016   Procedure: ESOPHAGOGASTRODUODENOSCOPY (EGD);  Surgeon: Daneil Dolin, MD;  Location: [facility] ENDO SUITE;  Service: Endoscopy;  Laterality: N/A;    SOCIAL HISTORY: Social History   Social History  . Marital status: Married    Spouse name: N/A  . Number of children: N/A  . Years of education: N/A   Occupational History  . Not on file.   Social History Main Topics  . Smoking status: Former Smoker    Packs/day: 0.50    Years: 30.00    Types: Cigarettes    Quit date: 03/13/2016  . Smokeless tobacco: Never Used  . Alcohol use No  . Drug use: No  . Sexual activity: No   Other Topics Concern  . Not on file   Social History Narrative  . No narrative on file    Born in [place] Married 43 years in September of this year. 1 child, daughter. 2 grandchildren, both grandsons.  Smokes half a pack a day. Started smoking in her early 20's. Denies problems with alcohol.  Hobbies include reading, sewing, crafting.  FAMILY HISTORY: Family History  Problem Relation Age of Onset  . Cancer Father    Mother & father deceased. Mother was 84; died from natural causes. Father died of colon cancer, was 5-59. 5 sisters. Oldest is diabetic. Baby sister is borderline diabetic. All of them have some form of heart disease. All on some form of blood pressure medicine. Mother also had high blood pressure; her mother had it too.  ALLERGIES:  is allergic to ace inhibitors and indomethacin.  MEDICATIONS:  Current Outpatient Prescriptions  Medication Sig Dispense Refill  . acetaminophen (TYLENOL) 500 MG tablet Take 1,000 mg by mouth every 6 (six) hours as needed for mild pain or moderate pain.     Marland Kitchen albuterol (PROVENTIL HFA;VENTOLIN HFA) 108 (90 BASE) MCG/ACT inhaler Inhale 1-2 puffs into the lungs every 6 (six) hours as needed for wheezing or shortness of breath. 1 Inhaler 0  . CARBOPLATIN IV Inject into the vein. Weekly with radiation    . Cetirizine HCl 10 MG CAPS Take 1 capsule (10 mg total) by mouth at bedtime. Restart after you've completed a three-day course of Benadryl.    . Cholecalciferol (VITAMIN D PO) Take 1 tablet by mouth daily. Reported on 03/31/2016    . ferrous gluconate (FERGON) 324 MG tablet Take 1 tablet (324 mg total) by mouth 2 (two) times daily with a meal. 60 tablet 3  . glipiZIDE (GLUCOTROL) 10 MG tablet Take 5 mg by mouth daily after supper.    Marland Kitchen HYDROcodone-acetaminophen (NORCO) 5-325 MG tablet Take 1-2 tablets by mouth every 4 (four) hours as needed for moderate pain. 120 tablet 0  . lidocaine-prilocaine (EMLA) cream Apply a quarter size amount to port site 1 hour prior to chemo. Do not rub in. Cover with plastic wrap. 30 g 3  .  ondansetron (ZOFRAN) 8 MG tablet Take 1 tablet (8 mg total) by mouth every 8 (eight) hours as needed for nausea or vomiting. 30 tablet 2  . oxymetazoline (NASAL SPRAY 12 HOUR) 0.05 % nasal spray Place 1 spray into both nostrils daily as needed for congestion. Reported on 02/28/2016    . PACLitaxel (TAXOL) 300 MG/50ML injection Inject into the vein. Weekly with radiation    . pantoprazole (PROTONIX) 40 MG tablet Take 1 tablet (40 mg total) by mouth 2 (two) times daily before a meal. 60 tablet 3  . potassium chloride SA (K-DUR,KLOR-CON) 20 MEQ tablet Take 1 tablet (20 mEq total) by mouth 2 (two) times daily. 30 tablet 2  . prochlorperazine (COMPAZINE) 10 MG tablet Take 1 tablet (10 mg total) by mouth every 6 (six) hours as needed for nausea or vomiting. 30 tablet 2  . sucralfate (CARAFATE) 1 g tablet Take 1 tablet (1 g total) by mouth 4 (four) times daily -  with meals and at bedtime. 120 tablet 0  . triamterene-hydrochlorothiazide (DYAZIDE) 37.5-25 MG per capsule Take 1 each (1 capsule total) by mouth daily.    Marland Kitchen levofloxacin (LEVAQUIN) 250 MG tablet Take 2 tablets ('500mg'$  total) on Day 1. Then take 1 tablet every 24 hours x 10 days. 12 tablet 0  . megestrol (MEGACE) 400 MG/10ML suspension Take 10 mLs (400 mg total) by mouth daily. 480 mL 1   Current Facility-Administered Medications  Medication Dose Route Frequency Provider Last Rate Last Dose  . sodium chloride flush (NS) 0.9 % injection 10 mL  10 mL Intravenous PRN Patrici Ranks, MD   10 mL at 06/10/16 1100    Review of Systems  Constitutional: Positive for malaise/fatigue and weight loss (6 lbs since 05/14/16). Negative for diaphoresis.       Appetite loss  HENT: Negative for hearing loss and tinnitus.   Eyes: Negative.  Negative for blurred vision, double vision, photophobia, pain, discharge and redness.  Respiratory: Positive for hemoptysis (worsening) and sputum production. Negative for shortness of breath and wheezing.        Bright red  hemoptysis every day, several  times a day.  Cardiovascular: Negative.  Negative for chest pain, palpitations, orthopnea, claudication, leg swelling and PND.  Gastrointestinal: Positive for nausea. Negative for abdominal pain, blood in stool, constipation, diarrhea, heartburn, melena and vomiting.       Intermittent nausea  Genitourinary: Negative.  Negative for dysuria, flank pain, frequency, hematuria and urgency.  Musculoskeletal: Positive for joint pain. Negative for back pain, falls, myalgias and neck pain.       Chronic R knee pain. L underarm pain  Skin: Negative.  Negative for itching and rash.  Neurological: Positive for weakness. Negative for dizziness, tingling, tremors, sensory change, speech change, focal weakness, seizures, loss of consciousness and headaches.  Endo/Heme/Allergies: Negative.   Psychiatric/Behavioral: Negative for depression, hallucinations, memory loss, substance abuse and suicidal ideas. The patient has insomnia. The patient is not nervous/anxious.   All other systems reviewed and are negative.  14 point ROS was done and is otherwise as detailed above or in HPI  PHYSICAL EXAMINATION: ECOG PERFORMANCE STATUS: 1 - Symptomatic but completely ambulatory  Vitals:   06/10/16 1015  BP: (!) 105/51  Pulse: (!) 112  Resp: 16  Temp: 98 F (36.7 C)   Filed Weights   06/10/16 1015  Weight: 159 lb 9.6 oz (72.4 kg)    Physical Exam  Constitutional: She is oriented to person, place, and time and well-developed, well-nourished, and in no distress.  Obese  HENT:  Head: Normocephalic and atraumatic.  Nose: Nose normal.  Mouth/Throat: Oropharynx is clear and moist. No oropharyngeal exudate.  Eyes: Conjunctivae and EOM are normal. Pupils are equal, round, and reactive to light. Right eye exhibits no discharge. Left eye exhibits no discharge. No scleral icterus.  Neck: Normal range of motion. Neck supple. No tracheal deviation present. No thyromegaly present.    Cardiovascular: Normal rate, regular rhythm, normal heart sounds and intact distal pulses.  Exam reveals no gallop and no friction rub.   No murmur heard. Pulmonary/Chest: Effort normal and breath sounds normal. She has no wheezes. She has no rales.  Abdominal: Soft. Bowel sounds are normal. She exhibits no distension and no mass. There is no tenderness. There is no rebound and no guarding.  Musculoskeletal: Normal range of motion. She exhibits no edema.  Lymphadenopathy:    She has no cervical adenopathy.  Neurological: She is alert and oriented to person, place, and time. She has normal reflexes. No cranial nerve deficit. Gait normal. Coordination normal.  Skin: Skin is warm and dry. No rash noted.  Psychiatric: Mood, memory, affect and judgment normal.  Nursing note and vitals reviewed.   LABORATORY DATA:  I have reviewed the data as listed Lab Results  Component Value Date   WBC 6.4 06/10/2016   HGB 7.1 (L) 06/10/2016   HCT 23.8 (L) 06/10/2016   MCV 83.8 06/10/2016   PLT 222 06/10/2016   CMP     Component Value Date/Time   NA 137 06/10/2016 1057   K 3.3 (L) 06/10/2016 1057   CL 105 06/10/2016 1057   CO2 27 06/10/2016 1057   GLUCOSE 167 (H) 06/10/2016 1057   BUN 18 06/10/2016 1057   CREATININE 0.98 06/10/2016 1057   CALCIUM 8.8 (L) 06/10/2016 1057   PROT 6.6 06/10/2016 1057   ALBUMIN 2.7 (L) 06/10/2016 1057   AST 13 (L) 06/10/2016 1057   ALT 7 (L) 06/10/2016 1057   ALKPHOS 60 06/10/2016 1057   BILITOT 0.8 06/10/2016 1057   GFRNONAA >60 06/10/2016 1057   GFRAA >60 06/10/2016 1057  RADIOGRAPHIC STUDIES: I have personally reviewed the radiological images as listed and agreed with the findings in the report. No results found. Study Result   CLINICAL DATA:  Lung cancer, hemoptysis.  Pancoast tumor.  EXAM: CHEST  2 VIEW  COMPARISON:  Multiple exams, including 02/19/2016  FINDINGS: Enlarging Pancoast tumor now involves the top path of the left  lung. Questionable indistinctness of the left anterior second rib, early bony destruction suspected.  Small contralateral (right) apical mass is apparently chronic and spiculation on prior imaging workup was that this may represent a nerve sheath tumor.  Mild but stable enlargement of the cardiopericardial silhouette noted with mild prominence of the central pulmonary vasculature. Atherosclerotic aortic arch. Power injectable right Port-A-Cath tip: SVC.  Cephalization of blood flow noted.  Thoracic spondylosis.  IMPRESSION: 1. Enlarging left apical mass no occupying about 50% of the left hemithorax, significantly increased from 02/19/2016. Probable bony destruction involving the left anterior second rib. 2. Stable small mass medially at the contralateral (right) lung apex, speculated to potentially be a nerve sheath tumor on prior imaging, given the stability. 3. New right internal jugular power injectable Port-A-Cath, tip SVC. 4. Mild enlargement of the cardiopericardial silhouette with cephalization of blood flow favoring pulmonary venous hypertension. 5. Hilar prominence possibly from adenopathy or prominent pulmonary vasculature.   Electronically Signed   By: Van Clines M.D.   On: 04/28/2016 13:36     PATHOLOGY:       ASSESSMENT & PLAN:  LUL pancoast tumor with chest wall invasion Symptomatic Anemia Iron deficiency  Low B12 Cancer related pain Concurrent Chemo/XRT Hyperglycemia Anxiety about health Hypotension due to drug Hemoptysis Weight loss Cancer cachexia   Stage IIB poorly differentiated left pancoast tumor with direct invasion into the chest wall (T3N0M0), measuring 9.6 cm in largest dimension with PET scan on 03/12/2016 demonstrating direct chest wall invasion and findings suspicious for lymphangitic spread. Biopsy unable identify phenotype but poorly differentiated. She completed concurrent chemo/xrt. Imaging is scheduled in the next  several weeks.  I am concerned about her hemoptysis. I have talked with Dr. Tammi Klippel today. Will see if Dr. Servando Snare thinks it is reasonable to bronch her. I am also going to start her on Levaquin X 10 days for the possibility of infection.   Her anemia is persistent to worse.  No further evidence of iron deficiency. She has completed B12 X 4. Will continue B12 monthly. I am going to arrange for transfusion. At some point if she continues to require transfusion, may need to consider BMBX.  Weight loss of 6 lbs since 05/14/2016.  She reports persistent hemoptysis. She  I have written her a prescription for megace, hopefully this will help with her appetite. I will have her meet with nutrition at follow-up if needed.   She is scheduled for follow up in radiation oncology with Dr. Tammi Klippel on 06/15/16.  She is scheduled for CT Chest on 06/26/16.  She will return for follow up in 2 weeks. She was advised to go to the ED if symptoms worsen.  Orders Placed This Encounter  Procedures  . CBC with Differential    Standing Status:   Future    Number of Occurrences:   1    Standing Expiration Date:   06/10/2017  . Comprehensive metabolic panel    Standing Status:   Future    Number of Occurrences:   1    Standing Expiration Date:   06/10/2017  . Practitioner attestation of consent  I, the ordering practitioner, attest that I have discussed with the patient the benefits, risks, side effects, alternatives, likelihood of achieving goals and potential problems during recovery for the procedure listed.    Standing Status:   Future    Standing Expiration Date:   06/10/2017    Order Specific Question:   Procedure    Answer:   Blood Product(s)  . Complete patient signature process for consent form    Standing Status:   Future    Standing Expiration Date:   06/10/2017  . Care order/instruction    Transfuse Parameters    Standing Status:   Future    Standing Expiration Date:   06/10/2017  . Type and  screen    Standing Status:   Future    Number of Occurrences:   1    Standing Expiration Date:   06/10/2017  . Prepare RBC    Standing Status:   Standing    Number of Occurrences:   1    Order Specific Question:   # of Units    Answer:   2 units    Order Specific Question:   Transfusion Indications    Answer:   Symptomatic Anemia    Order Specific Question:   If emergent release call blood bank    Answer:   Not emergent release    Order Specific Question:   Date/Time blood product needed    Answer:   06/11/16 @ 0730 in Short Stay    All questions were answered. The patient knows to call the clinic with any problems, questions or concerns.  This document serves as a record of services personally performed by Ancil Linsey, MD. It was created on her behalf by Arlyce Harman, a trained medical scribe. The creation of this record is based on the scribe's personal observations and the provider's statements to them. This document has been checked and approved by the attending provider.  I have reviewed the above documentation for accuracy and completeness and I agree with the above.  This note was electronically signed.    Molli Hazard, MD  06/19/2016 8:01 PM

## 2016-06-11 ENCOUNTER — Ambulatory Visit (HOSPITAL_COMMUNITY)
Admission: RE | Admit: 2016-06-11 | Discharge: 2016-06-11 | Disposition: A | Discharge status: Home / Self Care | Payer: Medicaid Other | Source: Ambulatory Visit | Attending: Oncology | Admitting: Oncology

## 2016-06-11 ENCOUNTER — Other Ambulatory Visit (HOSPITAL_COMMUNITY): Payer: Self-pay | Admitting: Oncology

## 2016-06-11 ENCOUNTER — Encounter (HOSPITAL_COMMUNITY)
Admission: RE | Admit: 2016-06-11 | Discharge: 2016-06-11 | Disposition: A | Discharge status: Home / Self Care | Payer: Medicaid Other | Source: Ambulatory Visit | Attending: Hematology & Oncology | Admitting: Hematology & Oncology

## 2016-06-11 DIAGNOSIS — C3412 Malignant neoplasm of upper lobe, left bronchus or lung: Secondary | ICD-10-CM | POA: Insufficient documentation

## 2016-06-11 DIAGNOSIS — R042 Hemoptysis: Secondary | ICD-10-CM | POA: Insufficient documentation

## 2016-06-11 DIAGNOSIS — I700 Atherosclerosis of aorta: Secondary | ICD-10-CM | POA: Insufficient documentation

## 2016-06-11 MED ORDER — HEPARIN SOD (PORK) LOCK FLUSH 100 UNIT/ML IV SOLN
500.0000 [IU] | Freq: Every day | INTRAVENOUS | Status: AC | PRN
  Administered 2016-06-11: 500 [IU]
  Filled 2016-06-11: qty 5

## 2016-06-11 MED ORDER — DIPHENHYDRAMINE HCL 25 MG PO CAPS
25.0000 mg | ORAL_CAPSULE | Freq: Once | ORAL | Status: AC
Start: 2016-06-11 — End: 2016-06-11
  Administered 2016-06-11: 25 mg via ORAL
  Filled 2016-06-11: qty 1

## 2016-06-11 MED ORDER — IOPAMIDOL (ISOVUE-300) INJECTION 61%
75.0000 mL | Freq: Once | INTRAVENOUS | Status: AC | PRN
  Administered 2016-06-11: 75 mL via INTRAVENOUS

## 2016-06-11 MED ORDER — ACETAMINOPHEN 325 MG PO TABS
650.0000 mg | ORAL_TABLET | Freq: Once | ORAL | Status: AC
  Administered 2016-06-11: 650 mg via ORAL
  Filled 2016-06-11: qty 2

## 2016-06-11 MED ORDER — SODIUM CHLORIDE 0.9 % IV SOLN
250.0000 mL | Freq: Once | INTRAVENOUS | Status: AC
  Administered 2016-06-11: 250 mL via INTRAVENOUS

## 2016-06-11 MED ORDER — SODIUM CHLORIDE 0.9% FLUSH: 10.0000 mL | INTRAVENOUS | Status: DC | PRN

## 2016-06-12 LAB — TYPE AND SCREEN
ABO/RH(D): O POS
ANTIBODY SCREEN: NEGATIVE
UNIT DIVISION: 0
Unit division: 0

## 2016-06-15 ENCOUNTER — Telehealth: Payer: Self-pay | Admitting: Radiation Oncology

## 2016-06-15 ENCOUNTER — Ambulatory Visit: Admission: RE | Admit: 2016-06-15 | Payer: Self-pay | Source: Ambulatory Visit | Admitting: Radiation Oncology

## 2016-06-15 NOTE — Telephone Encounter (Signed)
Patient did not show for 0800 appointment. Phoned patient's home to inquire. Reports hemoptysis continues. Scheduled for CT chest on 10/6 and follow up with Dr. Sheldon Silvan on 10/10. Patient understands to contact this RN with future needs.

## 2016-06-19 ENCOUNTER — Encounter (HOSPITAL_COMMUNITY): Payer: Self-pay | Admitting: Hematology & Oncology

## 2016-06-22 ENCOUNTER — Telehealth (HOSPITAL_COMMUNITY): Payer: Self-pay | Admitting: *Deleted

## 2016-06-22 DIAGNOSIS — E538 Deficiency of other specified B group vitamins: Secondary | ICD-10-CM

## 2016-06-22 DIAGNOSIS — R918 Other nonspecific abnormal finding of lung field: Secondary | ICD-10-CM

## 2016-06-22 DIAGNOSIS — D649 Anemia, unspecified: Secondary | ICD-10-CM

## 2016-06-22 MED ORDER — HYDROCODONE-ACETAMINOPHEN 5-325 MG PO TABS: 1.0000 | ORAL_TABLET | ORAL | 0 refills | Status: DC | PRN

## 2016-06-22 NOTE — Telephone Encounter (Signed)
Called patient, she said she had not heard any results from her CT. Explained to patient that she would get the results at her next MD visit which is 06/30/16. Patient also requested refill on pain medication. Chart checked and refilled.

## 2016-06-26 ENCOUNTER — Ambulatory Visit (HOSPITAL_COMMUNITY): Payer: Self-pay

## 2016-06-30 ENCOUNTER — Encounter (HOSPITAL_COMMUNITY): Payer: Medicaid Other | Attending: Hematology & Oncology | Admitting: Oncology

## 2016-06-30 ENCOUNTER — Encounter (HOSPITAL_COMMUNITY): Payer: Self-pay | Admitting: Oncology

## 2016-06-30 ENCOUNTER — Encounter: Payer: Self-pay | Admitting: Dietician

## 2016-06-30 ENCOUNTER — Encounter (HOSPITAL_COMMUNITY): Payer: Medicaid Other

## 2016-06-30 VITALS — BP 106/67 | HR 97 | Temp 98.7°F | Resp 16 | Wt 158.0 lb

## 2016-06-30 DIAGNOSIS — D649 Anemia, unspecified: Secondary | ICD-10-CM

## 2016-06-30 DIAGNOSIS — E118 Type 2 diabetes mellitus with unspecified complications: Secondary | ICD-10-CM | POA: Diagnosis not present

## 2016-06-30 DIAGNOSIS — Z90710 Acquired absence of both cervix and uterus: Secondary | ICD-10-CM | POA: Diagnosis not present

## 2016-06-30 DIAGNOSIS — Z8571 Personal history of Hodgkin lymphoma: Secondary | ICD-10-CM | POA: Diagnosis not present

## 2016-06-30 DIAGNOSIS — C3412 Malignant neoplasm of upper lobe, left bronchus or lung: Secondary | ICD-10-CM | POA: Diagnosis not present

## 2016-06-30 DIAGNOSIS — Z9889 Other specified postprocedural states: Secondary | ICD-10-CM | POA: Insufficient documentation

## 2016-06-30 DIAGNOSIS — I10 Essential (primary) hypertension: Secondary | ICD-10-CM | POA: Insufficient documentation

## 2016-06-30 DIAGNOSIS — F17210 Nicotine dependence, cigarettes, uncomplicated: Secondary | ICD-10-CM | POA: Insufficient documentation

## 2016-06-30 LAB — CBC WITH DIFFERENTIAL/PLATELET
BASOS PCT: 0 %
Basophils Absolute: 0 10*3/uL (ref 0.0–0.1)
EOS PCT: 4 %
Eosinophils Absolute: 0.2 10*3/uL (ref 0.0–0.7)
HEMATOCRIT: 23.6 % — AB (ref 36.0–46.0)
HEMOGLOBIN: 7 g/dL — AB (ref 12.0–15.0)
LYMPHS PCT: 19 %
Lymphs Abs: 1.1 10*3/uL (ref 0.7–4.0)
MCH: 24.8 pg — AB (ref 26.0–34.0)
MCHC: 29.7 g/dL — ABNORMAL LOW (ref 30.0–36.0)
MCV: 83.7 fL (ref 78.0–100.0)
MONOS PCT: 6 %
Monocytes Absolute: 0.4 10*3/uL (ref 0.1–1.0)
NEUTROS PCT: 71 %
Neutro Abs: 4.3 10*3/uL (ref 1.7–7.7)
PLATELETS: 249 10*3/uL (ref 150–400)
RBC: 2.82 MIL/uL — AB (ref 3.87–5.11)
RDW: 21.5 % — ABNORMAL HIGH (ref 11.5–15.5)
WBC: 6 10*3/uL (ref 4.0–10.5)

## 2016-06-30 LAB — COMPREHENSIVE METABOLIC PANEL
ALK PHOS: 65 U/L (ref 38–126)
ALT: 8 U/L — AB (ref 14–54)
AST: 14 U/L — AB (ref 15–41)
Albumin: 2.9 g/dL — ABNORMAL LOW (ref 3.5–5.0)
Anion gap: 9 (ref 5–15)
BUN: 17 mg/dL (ref 6–20)
CALCIUM: 9.4 mg/dL (ref 8.9–10.3)
CHLORIDE: 106 mmol/L (ref 101–111)
CO2: 22 mmol/L (ref 22–32)
CREATININE: 0.99 mg/dL (ref 0.44–1.00)
GFR calc Af Amer: >60 mL/min (ref >60)
GFR, EST NON AFRICAN AMERICAN: 59 mL/min — AB (ref >60)
Glucose, Bld: 115 mg/dL — ABNORMAL HIGH (ref 65–99)
Potassium: 3.4 mmol/L — ABNORMAL LOW (ref 3.5–5.1)
Sodium: 137 mmol/L (ref 135–145)
Total Bilirubin: 0.9 mg/dL (ref 0.3–1.2)
Total Protein: 7.1 g/dL (ref 6.5–8.1)

## 2016-06-30 MED ORDER — LEVOFLOXACIN 250 MG PO TABS: 250.0000 mg | ORAL_TABLET | Freq: Every day | ORAL | 0 refills | Status: DC

## 2016-06-30 MED ORDER — LEVOFLOXACIN 250 MG PO TABS: ORAL_TABLET | ORAL | 0 refills | Status: DC

## 2016-06-30 NOTE — Progress Notes (Signed)
Follow up with patient due to continued wt loss and poor appetite.  Contacted Susan Mahoney by visiting prior to office visit.   Wt Readings from Last 10 Encounters:  06/30/16 158 lb (71.7 kg)  06/10/16 159 lb 9.6 oz (72.4 kg)  05/14/16 165 lb 9.6 oz (75.1 kg)  05/12/16 165 lb (74.8 kg)  05/07/16 168 lb 9.6 oz (76.5 kg)  05/05/16 169 lb (76.7 kg)  05/01/16 176 lb 3.2 oz (79.9 kg)  04/30/16 175 lb (79.4 kg)  04/28/16 175 lb 9.6 oz (79.7 kg)  04/24/16 174 lb 11.2 oz (79.2 kg)   Patient weight has decrease by 20 lbs in 2 months, but seems to be stable x 3 weeks.   Patient reports oral intake as much improved and is currently not suffering from any significant symptoms or side effects.  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 She says she is consistently eating 3-4 meals a day now. No snacks. She mostly drinks soda and tea.   RD went through her dietary recall. Her eating habits are very good. She is eating eggs in the morning and toast with added butter. She is adding condiments to her food. She notes she is not eating a lot of bread and says she will eat hotdogs without the bun. This is a good habit.  2 hotdogs without the bun would be better then 1 hotdog with the bun. She ate a chicken potpie last night with green beans. She says she left out the vegetables from the pot pie and added more chicken. Again, her habits are very good.   She denies any significant n/v/c/d. She says she "feels better" overall.   She is presented with an Ensure Case today. She is encouraged to sip on these between meals. She is still eligible for 1 case of Ensure.   Susan Mahoney seemed to be in a much improved state today. She denies any inhibitors to PO intake at this time.   Left my contact info, coupons, Ensure Case, and handouts titled "Poor appetite"  Burtis Junes RD, LDN, [facility] Pager: [phone] 06/30/2016 10:48 AM

## 2016-06-30 NOTE — Progress Notes (Signed)
Susan Fairy, PA-C [address] [place] [address]  Pancoast tumor of left lung [facility]) - Plan: CT Chest W Contrast, levofloxacin (LEVAQUIN) 250 MG tablet, DISCONTINUED: levofloxacin (LEVAQUIN) 250 MG tablet  CURRENT THERAPY: Active surveillance  INTERVAL HISTORY: Susan Mahoney 65 y.o. 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  Biopsy unable identify phenotype but poorly differentiated. S/P weekly Carboplatin/Paclitaxel with concomitant XRT (Dr. Tammi Klippel), finishing on 05/14/2016.    Pancoast tumor of left lung ([place])   02/16/2016 - 02/20/2016 Hospital Admission    Symptomatic anemia, lung mass      02/17/2016 Imaging    CT chest with 9.6 x 6.1 x 6.6 cm macrolobulated mass in LUL with evidence of direct chest wall invasion, surrounding opacities ? early lymphangitic spread. No mediastinal or hilar LAD. pleural based lesion R apex prob cystic present 09/19/2013      02/18/2016 Procedure    Colonoscopy with diverticulosis, five polyps      02/19/2016 Initial Biopsy    Ct biopsy in IR      02/19/2016 Pathology Results    poorly diff malignancy, unclear phenotype. carcinoma and melanoma are ruled out, calretinin positive staining may be seen in mesotheliomas in addition to other tumors      02/19/2016 Procedure    EGD medium sized hiatal hernia, non obstructing schatzki ring, esophagitis      03/12/2016 PET scan    Hypermetabolic LUL lung mass with direct chest wall invasion. Lower level hypermetabolism within the surrounding ground-glass opacity and septal thickening, most consistent with lympangitic spread. No thoracic adenopathy.       03/20/2016 Procedure    Port placed      03/31/2016 - 05/12/2016 Chemotherapy   Weekly Carboplatin/Paclitaxel with XRT      03/31/2016 - 05/14/2016 Radiation Therapy    XRT in [place] with Dr. Tammi Klippel for 33 fractions over 6.5 weeks.        04/28/2016 Imaging    Chest xray- Enlarging left apical mass no occupying about 50% of the left hemithorax, significantly increased from 02/19/2016. Probable bony destruction involving the left anterior second rib.      05/06/2016 Pathology Results    02/18/14 specimen- PDL1 is low positive with 10% tumor score      05/13/2016 Pathology Results    [facility]- Alterations identified: CDKN2A/B loss, SLIT2 G715, TP53 P151T.  Additional findings: MS-Stable, tumor mutation burden- intermediate (11Muts/Mb).      06/11/2016 Imaging    CT chest- Increased size of left lung apex mass with persistent invasion into the lateral chest wall. 2. Worsening ground-glass opacity within the left lung, now including most of the left upper lobe and lingula, likely indicating progression of lymphangitic tumor spread. 3. New area of ground-glass opacity in the right upper lobe is also concerning for lymphangitic tumor spread. 4. Slight increase in size of right apical mass.      06/11/2016 Progression    CT scan of chest demonstrates lymphangitic spread.       She notes that her her hemoptysis is improved.  She notes that she will go 24 hours without hemoptysis.  She notes that her sputum is blood tinged/streaked when it occurs.  She notes that she otherwise feels well.  Review of Systems  Constitutional: Negative for chills and fever.  HENT: Negative.   Eyes: Negative.  Negative for blurred vision.  Respiratory: Positive for hemoptysis. Negative for cough and sputum production.   Cardiovascular: Negative.  Negative for chest pain.  Gastrointestinal: Negative.  Negative for nausea and vomiting.  Genitourinary: Negative.   Musculoskeletal: Negative.   Skin: Negative.   Neurological: Negative.   Endo/Heme/Allergies: Negative.     Psychiatric/Behavioral: Negative.     Past Medical History:  Diagnosis Date  . Anemia   . Diabetes mellitus, type 2 ([place])   . Hypertension   . Low vitamin B12 level 02/28/2016  . Obesity   . Pancoast tumor of left lung ([place]) 03/03/2016  . Sinus infection   . Tobacco abuse     Past Surgical History:  Procedure Laterality Date  . ABDOMINAL HYSTERECTOMY    . COLONOSCOPY N/A 02/18/2016   Procedure: COLONOSCOPY;  Surgeon: Daneil Dolin, MD;  Location[facility];  Service: Endoscopy;  Laterality: N/A;  . ESOPHAGOGASTRODUODENOSCOPY N/A 02/18/2016   Procedure: ESOPHAGOGASTRODUODENOSCOPY (EGD);  Surgeon: Daneil Dolin, MD;  Location: [facility];  Service: Endoscopy;  Laterality: N/A;    Family History  Problem Relation Age of Onset  . Cancer Father     Social History   Social History  . Marital status: Married    Spouse name: N/A  . Number of children: N/A  . Years of education: N/A   Social History Main Topics  . Smoking status: Former Smoker    Packs/day: 0.50    Years: 30.00    Types: Cigarettes    Quit date: 03/13/2016  . Smokeless tobacco: Never Used  . Alcohol use No  . Drug use: No  . Sexual activity: No   Other Topics Concern  . None   Social History Narrative  . None     PHYSICAL EXAMINATION  ECOG PERFORMANCE STATUS: 1 - Symptomatic but completely ambulatory  Vitals:   06/30/16 1000  BP: 106/67  Pulse: 97  Resp: 16  Temp: 98.7 F (37.1 C)    GENERAL:alert, no distress, well nourished, well developed, comfortable, cooperative, obese, smiling and accompanied by husband SKIN: skin color, texture, turgor are normal, no rashes or significant lesions HEAD: Normocephalic, No masses, lesions, tenderness or abnormalities EYES: normal, EOMI, Conjunctiva are pink and non-injected EARS: External ears normal OROPHARYNX:lips, buccal mucosa, and tongue normal and mucous membranes are moist  NECK: supple, no adenopathy, trachea midline LYMPH:  no  palpable lymphadenopathy BREAST:not examined LUNGS: clear to auscultation , decreased breath sounds on left HEART: regular rate & rhythm, no murmurs and no gallops ABDOMEN:abdomen soft and normal bowel sounds BACK: Back symmetric, no curvature. EXTREMITIES:less then 2 second capillary refill, no joint deformities, effusion, or inflammation, no skin discoloration, no cyanosis  NEURO: alert & oriented x 3 with fluent speech, no focal motor/sensory deficits, gait normal   LABORATORY DATA: CBC    Component Value Date/Time   WBC 6.4 06/10/2016 1057   RBC 2.84 (L) 06/10/2016 1057   HGB 7.1 (L) 06/10/2016 1057   HCT 23.8 (L) 06/10/2016 1057   PLT 222 06/10/2016 1057   MCV 83.8 06/10/2016 1057   MCH 25.0 (L) 06/10/2016 1057   MCHC 29.8 (L) 06/10/2016 1057   RDW 23.6 (H) 06/10/2016 1057   LYMPHSABS 0.9 06/10/2016 1057   MONOABS 0.8 06/10/2016 1057   EOSABS 0.1 06/10/2016 1057   BASOSABS 0.0 06/10/2016 1057      Chemistry      Component Value  Date/Time   NA 137 06/10/2016 1057   K 3.3 (L) 06/10/2016 1057   CL 105 06/10/2016 1057   CO2 27 06/10/2016 1057   BUN 18 06/10/2016 1057   CREATININE 0.98 06/10/2016 1057      Component Value Date/Time   CALCIUM 8.8 (L) 06/10/2016 1057   ALKPHOS 60 06/10/2016 1057   AST 13 (L) 06/10/2016 1057   ALT 7 (L) 06/10/2016 1057   BILITOT 0.8 06/10/2016 1057        PENDING LABS:   RADIOGRAPHIC STUDIES:  Ct Chest W Contrast  Result Date: 06/11/2016 CLINICAL DATA:  Hemoptysis.  Pancoast tumor. EXAM: CT CHEST WITH CONTRAST TECHNIQUE: Multidetector CT imaging of the chest was performed during intravenous contrast administration. CONTRAST:  32m ISOVUE-300 IOPAMIDOL (ISOVUE-300) INJECTION 61% COMPARISON:  Chest CT 02/19/2016 and PETCT 03/12/2016 FINDINGS: Cardiovascular: There is a right chest wall Port-A-Cath with tip at the cavoatrial junction. Heart size is within normal limits. No pericardial effusion. The central pulmonary arteries are  normal. There is calcification within the aortic arch. The left common carotid and brachiocephalic arteries share a common origin. Mediastinum/Nodes: No mediastinal, hilar or axillary lymphadenopathy. Multiple hypodense nodules within the thyroid gland. Unremarkable thoracic esophagus. Lungs/Pleura: The trachea and proximal airways are patent. Left apical mass measures 11.9 x 6.8 cm, enlarged from a 10.2 x 6.1 cm previously. There is persistent chest wall invasion laterally. Surrounding area of ground-glass attenuation has increased and now occupies most of the left upper lobe and lingula. Mass in the right upper lobe apex measures 3.9 x 2.8 cm, 3.4 x 2.4 cm previously. There is extensive right upper lobe ground-glass opacity that is new from the prior study. There is a small area of ground-glass opacity in the right middle lobe. No pleural effusion. Upper Abdomen: The incompletely visualized left renal pelvis appears dilated. There are multiple renal cysts. Visualized upper abdominal organs are otherwise unremarkable. Musculoskeletal: No lytic or blastic lesions. No bony spinal canal stenosis. IMPRESSION: 1. Increased size of left lung apex mass with persistent invasion into the lateral chest wall. 2. Worsening ground-glass opacity within the left lung, now including most of the left upper lobe and lingula, likely indicating progression of lymphangitic tumor spread. 3. New area of ground-glass opacity in the right upper lobe is also concerning for lymphangitic tumor spread. 4. Slight increase in size of right apical mass. 5. Aortic atherosclerosis. Electronically Signed   By: KUlyses JarredM.D.   On: 06/11/2016 18:28     PATHOLOGY:    ASSESSMENT AND PLAN:  Pancoast tumor of left lung ([place] Stage IIB poorly differentiated left pancoast tumor with direct invasion into the chest wall (T3N0M0), measuring 9.6 cm in largest dimension with PET scan on 03/12/2016 demonstrating direct chest wall invasion and  findings suspicious for lymphangitic spread.  Biopsy unable identify phenotype but poorly differentiated. S/P weekly Carboplatin/Paclitaxel with concomitant XRT (Dr. MTammi Klippel, finishing on 05/14/2016.  Oncology history updated.  Labs today: CBC diff, CMET.  I personally reviewed and went over laboratory results with the patient.  The results are noted within this dictation.    I personally reviewed and went over radiographic studies with the patient.  The results are noted within this dictation.  CT of chest on 06/11/2016 demonstrates concerning findings for resistant disease with progression.  Due to clinical findings ~ 2 weeks ago, she was started on 10 day course of Levaquin.  She notes that her hemoptysis is improved and she goes days without hemoptysis.  Will give  an additional week course of Levaquin.  Rx is printed for the patient.  Repeat CT scan in 2 weeks: Order is placed for CT of chest w contrast.  Return in 2-3 weeks for labs, review of CT scans, and further medical oncology recommendations.  Addendum: At the conclusion of today's office visit, I noted that labs were not performed today and therefore, I signed off to nursing requesting labs today.  Mr. Chopra did not believe the nurse that I wanted labs today and demanded that I tell him whether labs were needed for the patient as he wants to hear it from a "professional."  I have educated Mr. Nolte that labs are wanted today and that nursing is considered a healthcare provider and thus "professional."   [place]: Orders Placed This Encounter  Procedures  . CT Chest W Contrast    MEDICATIONS PRESCRIBED THIS ENCOUNTER: Meds ordered this encounter  Medications  . DISCONTD: levofloxacin (LEVAQUIN) 250 MG tablet    Sig: Take 2 tablets (572m total) on Day 1. Then take 1 tablet every 24 hours x 10 days.    Dispense:  7 tablet    Refill:  0    Order Specific Question:   Supervising Provider    Answer:    PPatrici Ranks[[id number] . levofloxacin (LEVAQUIN) 250 MG tablet    Sig: Take 1 tablet (250 mg total) by mouth daily.    Dispense:  7 tablet    Refill:  0    Order Specific Question:   Supervising Provider    Answer:   PPatrici Ranks[[id number]   THERAPY PLAN:  Repeat imaging in ~ 2 weeks.  All questions were answered. The patient knows to call the clinic with any problems, questions or concerns. We can certainly see the patient much sooner if necessary.  Patient and plan discussed with Dr. SAncil Linseyand she is in agreement with the aforementioned.   This note is electronically signed by: KDoy Mince10/06/2016 10:48 AM

## 2016-06-30 NOTE — Assessment & Plan Note (Addendum)
Stage IIB poorly differentiated left pancoast tumor with direct invasion into the chest wall (T3N0M0), measuring 9.6 cm in largest dimension with PET scan on 03/12/2016 demonstrating direct chest wall invasion and findings suspicious for lymphangitic spread.  Biopsy unable identify phenotype but poorly differentiated. S/P weekly Carboplatin/Paclitaxel with concomitant XRT (Dr. Tammi Klippel), finishing on 05/14/2016.  Oncology history updated.  Labs today: CBC diff, CMET.  I personally reviewed and went over laboratory results with the patient.  The results are noted within this dictation.    I personally reviewed and went over radiographic studies with the patient.  The results are noted within this dictation.  CT of chest on 06/11/2016 demonstrates concerning findings for resistant disease with progression.  Due to clinical findings ~ 2 weeks ago, she was started on 10 day course of Levaquin.  She notes that her hemoptysis is improved and she goes days without hemoptysis.  Will give an additional week course of Levaquin.  Rx is printed for the patient.  Repeat CT scan in 2 weeks: Order is placed for CT of chest w contrast.  Return in 2-3 weeks for labs, review of CT scans, and further medical oncology recommendations.  Addendum: At the conclusion of today's office visit, I noted that labs were not performed today and therefore, I signed off to nursing requesting labs today.  Mr. Speltz did not believe the nurse that I wanted labs today and demanded that I tell him whether labs were needed for the patient as he wants to hear it from a "professional."  I have educated Mr. Bocchino that labs are wanted today and that nursing is considered a healthcare provider and thus "professional."  Addendum: HGB returned at 7.0 g/dL.  Patient will be called and offered 2 unit PRBCs.  She will be recalled for additional anemia evaluation: CBC diff, anemia panel, retic, haptoglobin, EPO level, ESR, CRP, SPEP+IFE, light chain  assay, IgG, IgM, IgA, and stool cards x 3.  If negative work-up then she will need a bone marrow aspiration and biopsy for further evaluation.

## 2016-06-30 NOTE — Patient Instructions (Signed)
[place] at [facility] Discharge Instructions  RECOMMENDATIONS MADE BY THE CONSULTANT AND ANY TEST RESULTS WILL BE SENT TO YOUR REFERRING PHYSICIAN.  You were seen today by Kirby Crigler PA-C. Levaquin was given today. Ct in about 2 weeks. Follow up and labs in 3 weeks. Labs today.   Thank you for choosing [place] at [facility] to provide your oncology and hematology care.  To afford each patient quality time with our provider, please arrive at least 15 minutes before your scheduled appointment time.   Beginning January 23rd 2017 lab work for the [facility] will be done in the  Main lab at [facility] on 1st floor. If you have a lab appointment with the [facility] please come in thru the  Main Entrance and check in at the main information desk  You need to re-schedule your appointment should you arrive 10 or more minutes late.  We strive to give you quality time with our providers, and arriving late affects you and other patients whose appointments are after yours.  Also, if you no show three or more times for appointments you may be dismissed from the clinic at the providers discretion.     Again, thank you for choosing [facility].  Our hope is that these requests will decrease the amount of time that you wait before being seen by our physicians.       _____________________________________________________________  Should you have questions after your visit to [facility], please contact our office at [phone] between the hours of 8:30 a.m. and 4:30 p.m.  Voicemails left after 4:30 p.m. will not be returned until the following business day.  For prescription refill requests, have your pharmacy contact our office.         Resources For Cancer Patients and their Caregivers ? American Cancer Society: Can assist with transportation, wigs, general needs, runs Look Good Feel Better.         251-789-4376 ? Cancer Care: Provides financial assistance, online support groups, medication/co-pay assistance.  1-800-813-HOPE (613) 670-8553) ? [facility] Assists [place] cancer patients and their families through emotional , educational and financial support.  717-578-7649 ? [place] DSS Where to apply for food stamps, Medicaid and utility assistance. (775)093-9189 ? RCATS: Transportation to medical appointments. 989 449 4584 ? Social Security Administration: May apply for disability if have a Stage IV cancer. 7721016154 (878)523-0136 ? [employer], Disability and Transit Services: Assists with nutrition, care and transit needs. [place] Support Programs: '@10RELATIVEDAYS'$ @ > Cancer Support Group  2nd Tuesday of the month 1pm-2pm, Journey Room  > Creative Journey  3rd Tuesday of the month 1130am-1pm, Journey Room  > Look Good Feel Better  1st Wednesday of the month 10am-12 noon, Journey Room (Call [place] to register [phone])

## 2016-07-01 NOTE — Addendum Note (Signed)
Addended by: Baird [facility] on: 07/01/2016 06:42 PM   Modules accepted: Orders, SmartSet

## 2016-07-02 ENCOUNTER — Other Ambulatory Visit (HOSPITAL_COMMUNITY): Payer: Self-pay

## 2016-07-02 ENCOUNTER — Encounter (HOSPITAL_COMMUNITY): Payer: Medicaid Other

## 2016-07-02 DIAGNOSIS — D649 Anemia, unspecified: Secondary | ICD-10-CM

## 2016-07-02 DIAGNOSIS — C3412 Malignant neoplasm of upper lobe, left bronchus or lung: Secondary | ICD-10-CM | POA: Diagnosis not present

## 2016-07-02 LAB — RETICULOCYTES
RBC.: 2.49 MIL/uL — AB (ref 3.87–5.11)
Retic Count, Absolute: 117 10*3/uL (ref 19.0–186.0)
Retic Ct Pct: 4.7 % — ABNORMAL HIGH (ref 0.4–3.1)

## 2016-07-02 LAB — IRON AND TIBC
IRON: 34 ug/dL (ref 28–170)
Saturation Ratios: 11 % (ref 10.4–31.8)
TIBC: 308 ug/dL (ref 250–450)
UIBC: 274 ug/dL

## 2016-07-02 LAB — C-REACTIVE PROTEIN: CRP: 5.5 mg/dL — ABNORMAL HIGH (ref <1.0)

## 2016-07-02 LAB — CBC WITH DIFFERENTIAL/PLATELET
BASOS PCT: 0 %
Basophils Absolute: 0 10*3/uL (ref 0.0–0.1)
EOS ABS: 0.1 10*3/uL (ref 0.0–0.7)
EOS PCT: 1 %
HEMATOCRIT: 20.8 % — AB (ref 36.0–46.0)
Hemoglobin: 6.3 g/dL — CL (ref 12.0–15.0)
LYMPHS ABS: 0.9 10*3/uL (ref 0.7–4.0)
Lymphocytes Relative: 11 %
MCH: 24.9 pg — AB (ref 26.0–34.0)
MCHC: 29.8 g/dL — ABNORMAL LOW (ref 30.0–36.0)
MCV: 83.5 fL (ref 78.0–100.0)
MONO ABS: 0.3 10*3/uL (ref 0.1–1.0)
Monocytes Relative: 4 %
NEUTROS ABS: 7.2 10*3/uL (ref 1.7–7.7)
NEUTROS PCT: 84 %
PLATELETS: 256 10*3/uL (ref 150–400)
RBC: 2.49 MIL/uL — ABNORMAL LOW (ref 3.87–5.11)
RDW: 21.4 % — AB (ref 11.5–15.5)
WBC: 8.5 10*3/uL (ref 4.0–10.5)

## 2016-07-02 LAB — FOLATE: Folate: 9.4 ng/mL (ref >5.9)

## 2016-07-02 LAB — SEDIMENTATION RATE: Sed Rate: 128 mm/hr — ABNORMAL HIGH (ref 0–22)

## 2016-07-02 LAB — LACTATE DEHYDROGENASE: LDH: 301 U/L — AB (ref 98–192)

## 2016-07-02 LAB — FERRITIN: FERRITIN: 288 ng/mL (ref 11–307)

## 2016-07-02 LAB — VITAMIN B12: Vitamin B-12: 594 pg/mL (ref 180–914)

## 2016-07-02 LAB — PREPARE RBC (CROSSMATCH)

## 2016-07-02 NOTE — Progress Notes (Signed)
CRITICAL VALUE ALERT Critical value received:  Hgb.-6.2 Date of notification:  07/02/2016 Time of notification: 4287 Critical value read back:  Yes.   Nurse who received alert:  M.Rielly Corlett, LPN MD notified (1st page):  T.Kefalas, PA-C

## 2016-07-03 ENCOUNTER — Encounter (HOSPITAL_BASED_OUTPATIENT_CLINIC_OR_DEPARTMENT_OTHER): Payer: Medicaid Other

## 2016-07-03 DIAGNOSIS — C3412 Malignant neoplasm of upper lobe, left bronchus or lung: Secondary | ICD-10-CM | POA: Diagnosis not present

## 2016-07-03 DIAGNOSIS — D649 Anemia, unspecified: Secondary | ICD-10-CM

## 2016-07-03 LAB — IMMUNOFIXATION ELECTROPHORESIS
IgA: 244 mg/dL (ref 87–352)
IgG (Immunoglobin G), Serum: 768 mg/dL (ref 700–1600)
IgM, Serum: 31 mg/dL (ref 26–217)
Total Protein ELP: 6.1 g/dL (ref 6.0–8.5)

## 2016-07-03 LAB — PROTEIN ELECTROPHORESIS, SERUM
A/G Ratio: 0.8 (ref 0.7–1.7)
Albumin ELP: 2.7 g/dL — ABNORMAL LOW (ref 2.9–4.4)
Alpha-1-Globulin: 0.4 g/dL (ref 0.0–0.4)
Alpha-2-Globulin: 1.1 g/dL — ABNORMAL HIGH (ref 0.4–1.0)
BETA GLOBULIN: 1.2 g/dL (ref 0.7–1.3)
GAMMA GLOBULIN: 0.8 g/dL (ref 0.4–1.8)
Globulin, Total: 3.6 g/dL (ref 2.2–3.9)
Total Protein ELP: 6.3 g/dL (ref 6.0–8.5)

## 2016-07-03 LAB — IGG, IGA, IGM
IGA: 255 mg/dL (ref 87–352)
IGM, SERUM: 29 mg/dL (ref 26–217)
IgG (Immunoglobin G), Serum: 770 mg/dL (ref 700–1600)

## 2016-07-03 LAB — KAPPA/LAMBDA LIGHT CHAINS
KAPPA, LAMDA LIGHT CHAIN RATIO: 1.27 (ref 0.26–1.65)
Kappa free light chain: 45.3 mg/L — ABNORMAL HIGH (ref 3.3–19.4)
LAMDA FREE LIGHT CHAINS: 35.7 mg/L — AB (ref 5.7–26.3)

## 2016-07-03 LAB — HAPTOGLOBIN: Haptoglobin: 438 mg/dL — ABNORMAL HIGH (ref 34–200)

## 2016-07-03 MED ORDER — DIPHENHYDRAMINE HCL 50 MG/ML IJ SOLN
25.0000 mg | Freq: Once | INTRAMUSCULAR | Status: AC
  Administered 2016-07-03: 25 mg via INTRAVENOUS

## 2016-07-03 MED ORDER — SODIUM CHLORIDE 0.9% FLUSH
10.0000 mL | INTRAVENOUS | Status: AC | PRN
  Administered 2016-07-03: 10 mL

## 2016-07-03 MED ORDER — SODIUM CHLORIDE 0.9 % IV SOLN
250.0000 mL | Freq: Once | INTRAVENOUS | Status: AC
  Administered 2016-07-03: 250 mL via INTRAVENOUS

## 2016-07-03 MED ORDER — ACETAMINOPHEN 325 MG PO TABS
650.0000 mg | ORAL_TABLET | Freq: Once | ORAL | Status: AC
  Administered 2016-07-03: 650 mg via ORAL

## 2016-07-03 MED ORDER — HEPARIN SOD (PORK) LOCK FLUSH 100 UNIT/ML IV SOLN
500.0000 [IU] | Freq: Every day | INTRAVENOUS | Status: AC | PRN
  Administered 2016-07-03: 500 [IU]

## 2016-07-03 NOTE — Progress Notes (Signed)
Patient tolerated transfusion well.  VSS.  Patient stable upon discharge from clinic, wheeled out by husband via wheelchair per patient preference.

## 2016-07-03 NOTE — Patient Instructions (Signed)
[place] at [facility] Discharge Instructions  RECOMMENDATIONS MADE BY THE CONSULTANT AND ANY TEST RESULTS WILL BE SENT TO YOUR REFERRING PHYSICIAN.  Two units of blood today.    Thank you for choosing [place] at [facility] to provide your oncology and hematology care.  To afford each patient quality time with our provider, please arrive at least 15 minutes before your scheduled appointment time.   Beginning January 23rd 2017 lab work for the [facility] will be done in the  Main lab at [facility] on 1st floor. If you have a lab appointment with the [facility] please come in thru the  Main Entrance and check in at the main information desk  You need to re-schedule your appointment should you arrive 10 or more minutes late.  We strive to give you quality time with our providers, and arriving late affects you and other patients whose appointments are after yours.  Also, if you no show three or more times for appointments you may be dismissed from the clinic at the providers discretion.     Again, thank you for choosing [facility].  Our hope is that these requests will decrease the amount of time that you wait before being seen by our physicians.       _____________________________________________________________  Should you have questions after your visit to [facility], please contact our office at [phone] between the hours of 8:30 a.m. and 4:30 p.m.  Voicemails left after 4:30 p.m. will not be returned until the following business day.  For prescription refill requests, have your pharmacy contact our office.         Resources For Cancer Patients and their Caregivers ? American Cancer Society: Can assist with transportation, wigs, general needs, runs Look Good Feel Better.        559-639-0628 ? Cancer Care: Provides financial assistance, online support groups, medication/co-pay assistance.   1-800-813-HOPE 9385331522) ? [place] Assists [place] cancer patients and their families through emotional , educational and financial support.  (317) 182-7554 ? [place] DSS Where to apply for food stamps, Medicaid and utility assistance. 763-859-0420 ? RCATS: Transportation to medical appointments. (219)580-3194 ? Social Security Administration: May apply for disability if have a Stage IV cancer. 330-145-5279 9517145312 ? [employer], Disability and Transit Services: Assists with nutrition, care and transit needs. [place] Support Programs: '@10RELATIVEDAYS'$ @ > Cancer Support Group  2nd Tuesday of the month 1pm-2pm, Journey Room  > Creative Journey  3rd Tuesday of the month 1130am-1pm, Journey Room  > Look Good Feel Better  1st Wednesday of the month 10am-12 noon, Journey Room (Call [place] to register [phone])

## 2016-07-04 LAB — TYPE AND SCREEN
ABO/RH(D): O POS
Antibody Screen: NEGATIVE
UNIT DIVISION: 0
Unit division: 0

## 2016-07-07 ENCOUNTER — Encounter (HOSPITAL_COMMUNITY): Payer: Self-pay

## 2016-07-13 ENCOUNTER — Other Ambulatory Visit (HOSPITAL_COMMUNITY): Payer: Self-pay | Admitting: Oncology

## 2016-07-13 ENCOUNTER — Telehealth (HOSPITAL_COMMUNITY): Payer: Self-pay | Admitting: *Deleted

## 2016-07-13 DIAGNOSIS — R918 Other nonspecific abnormal finding of lung field: Secondary | ICD-10-CM

## 2016-07-13 DIAGNOSIS — D649 Anemia, unspecified: Secondary | ICD-10-CM

## 2016-07-13 MED ORDER — HYDROCODONE-ACETAMINOPHEN 5-325 MG PO TABS: 1.0000 | ORAL_TABLET | ORAL | 0 refills | Status: DC | PRN

## 2016-07-13 NOTE — Telephone Encounter (Signed)
Rx printed.  Robynn Pane, PA-C 07/13/2016 3:59 PM

## 2016-07-14 ENCOUNTER — Ambulatory Visit (HOSPITAL_COMMUNITY)
Admission: RE | Admit: 2016-07-14 | Discharge: 2016-07-14 | Disposition: A | Discharge status: Home / Self Care | Payer: Medicaid Other | Source: Ambulatory Visit | Attending: Oncology | Admitting: Oncology

## 2016-07-14 DIAGNOSIS — C3412 Malignant neoplasm of upper lobe, left bronchus or lung: Secondary | ICD-10-CM

## 2016-07-17 ENCOUNTER — Ambulatory Visit (HOSPITAL_COMMUNITY)
Admission: RE | Admit: 2016-07-17 | Discharge: 2016-07-17 | Disposition: A | Discharge status: Home / Self Care | Payer: Medicaid Other | Source: Ambulatory Visit | Attending: Oncology | Admitting: Oncology

## 2016-07-17 DIAGNOSIS — C3412 Malignant neoplasm of upper lobe, left bronchus or lung: Secondary | ICD-10-CM | POA: Insufficient documentation

## 2016-07-17 MED ORDER — IOPAMIDOL (ISOVUE-300) INJECTION 61%
75.0000 mL | Freq: Once | INTRAVENOUS | Status: AC | PRN
  Administered 2016-07-17: 75 mL via INTRAVENOUS

## 2016-07-21 ENCOUNTER — Other Ambulatory Visit (HOSPITAL_COMMUNITY): Payer: Self-pay

## 2016-07-21 ENCOUNTER — Ambulatory Visit (HOSPITAL_COMMUNITY): Payer: Self-pay | Admitting: Oncology

## 2016-07-21 ENCOUNTER — Encounter (HOSPITAL_COMMUNITY): Payer: Medicaid Other

## 2016-07-21 DIAGNOSIS — C3412 Malignant neoplasm of upper lobe, left bronchus or lung: Secondary | ICD-10-CM

## 2016-07-21 LAB — CBC WITH DIFFERENTIAL/PLATELET
Basophils Absolute: 0 10*3/uL (ref 0.0–0.1)
Basophils Relative: 0 %
EOS PCT: 2 %
Eosinophils Absolute: 0.1 10*3/uL (ref 0.0–0.7)
HCT: 32 % — ABNORMAL LOW (ref 36.0–46.0)
HEMOGLOBIN: 10 g/dL — AB (ref 12.0–15.0)
LYMPHS ABS: 1.5 10*3/uL (ref 0.7–4.0)
LYMPHS PCT: 27 %
MCH: 25.4 pg — AB (ref 26.0–34.0)
MCHC: 31.3 g/dL (ref 30.0–36.0)
MCV: 81.4 fL (ref 78.0–100.0)
Monocytes Absolute: 0.5 10*3/uL (ref 0.1–1.0)
Monocytes Relative: 8 %
Neutro Abs: 3.5 10*3/uL (ref 1.7–7.7)
Neutrophils Relative %: 63 %
PLATELETS: 297 10*3/uL (ref 150–400)
RBC: 3.93 MIL/uL (ref 3.87–5.11)
RDW: 17.3 % — ABNORMAL HIGH (ref 11.5–15.5)
WBC: 5.5 10*3/uL (ref 4.0–10.5)

## 2016-07-21 LAB — COMPREHENSIVE METABOLIC PANEL
ALK PHOS: 88 U/L (ref 38–126)
ALT: 21 U/L (ref 14–54)
AST: 23 U/L (ref 15–41)
Albumin: 3.1 g/dL — ABNORMAL LOW (ref 3.5–5.0)
Anion gap: 8 (ref 5–15)
BILIRUBIN TOTAL: 0.5 mg/dL (ref 0.3–1.2)
BUN: 24 mg/dL — ABNORMAL HIGH (ref 6–20)
CALCIUM: 9.9 mg/dL (ref 8.9–10.3)
CO2: 21 mmol/L — ABNORMAL LOW (ref 22–32)
CREATININE: 1.2 mg/dL — AB (ref 0.44–1.00)
Chloride: 110 mmol/L (ref 101–111)
GFR, EST AFRICAN AMERICAN: 55 mL/min — AB (ref >60)
GFR, EST NON AFRICAN AMERICAN: 47 mL/min — AB (ref >60)
Glucose, Bld: 131 mg/dL — ABNORMAL HIGH (ref 65–99)
Potassium: 3.6 mmol/L (ref 3.5–5.1)
Sodium: 139 mmol/L (ref 135–145)
Total Protein: 7.6 g/dL (ref 6.5–8.1)

## 2016-07-23 ENCOUNTER — Other Ambulatory Visit (HOSPITAL_COMMUNITY): Payer: Self-pay

## 2016-07-23 ENCOUNTER — Encounter (HOSPITAL_COMMUNITY): Payer: Medicaid Other | Attending: Hematology & Oncology | Admitting: Hematology & Oncology

## 2016-07-23 ENCOUNTER — Ambulatory Visit (HOSPITAL_COMMUNITY): Payer: Self-pay | Admitting: Hematology & Oncology

## 2016-07-23 ENCOUNTER — Encounter (HOSPITAL_COMMUNITY): Payer: Self-pay | Admitting: Hematology & Oncology

## 2016-07-23 VITALS — BP 132/65 | HR 69 | Temp 98.5°F | Resp 16 | Wt 157.4 lb

## 2016-07-23 DIAGNOSIS — R918 Other nonspecific abnormal finding of lung field: Secondary | ICD-10-CM | POA: Diagnosis not present

## 2016-07-23 DIAGNOSIS — F17210 Nicotine dependence, cigarettes, uncomplicated: Secondary | ICD-10-CM | POA: Insufficient documentation

## 2016-07-23 DIAGNOSIS — E538 Deficiency of other specified B group vitamins: Secondary | ICD-10-CM

## 2016-07-23 DIAGNOSIS — D649 Anemia, unspecified: Secondary | ICD-10-CM

## 2016-07-23 DIAGNOSIS — Z9889 Other specified postprocedural states: Secondary | ICD-10-CM | POA: Insufficient documentation

## 2016-07-23 DIAGNOSIS — R634 Abnormal weight loss: Secondary | ICD-10-CM | POA: Diagnosis not present

## 2016-07-23 DIAGNOSIS — E118 Type 2 diabetes mellitus with unspecified complications: Secondary | ICD-10-CM | POA: Insufficient documentation

## 2016-07-23 DIAGNOSIS — R042 Hemoptysis: Secondary | ICD-10-CM

## 2016-07-23 DIAGNOSIS — Z90710 Acquired absence of both cervix and uterus: Secondary | ICD-10-CM | POA: Insufficient documentation

## 2016-07-23 DIAGNOSIS — C3412 Malignant neoplasm of upper lobe, left bronchus or lung: Secondary | ICD-10-CM | POA: Diagnosis not present

## 2016-07-23 DIAGNOSIS — Z8571 Personal history of Hodgkin lymphoma: Secondary | ICD-10-CM | POA: Insufficient documentation

## 2016-07-23 DIAGNOSIS — I10 Essential (primary) hypertension: Secondary | ICD-10-CM | POA: Insufficient documentation

## 2016-07-23 NOTE — Progress Notes (Signed)
[facility]  Progress Note  Patient Care Team: [name], PA-C as PCP - General (Physician Assistant) Daneil Dolin, MD as Consulting Physician (Gastroenterology)  CHIEF COMPLAINTS:     Pancoast tumor of left lung [facility])   02/16/2016 - 02/20/2016 Hospital Admission    Symptomatic anemia, lung mass      02/17/2016 Imaging    CT chest with 9.6 x 6.1 x 6.6 cm macrolobulated mass in LUL with evidence of direct chest wall invasion, surrounding opacities ? early lymphangitic spread. No mediastinal or hilar LAD. pleural based lesion R apex prob cystic present 09/19/2013      02/18/2016 Procedure    Colonoscopy with diverticulosis, five polyps      02/19/2016 Initial Biopsy    Ct biopsy in IR      02/19/2016 Pathology Results    poorly diff malignancy, unclear phenotype. carcinoma and melanoma are ruled out, calretinin positive staining may be seen in mesotheliomas in addition to other tumors      02/19/2016 Procedure    EGD medium sized hiatal hernia, non obstructing schatzki ring, esophagitis      03/12/2016 PET scan    Hypermetabolic LUL lung mass with direct chest wall invasion. Lower level hypermetabolism within the surrounding ground-glass opacity and septal thickening, most consistent with lympangitic spread. No thoracic adenopathy.       03/20/2016 Procedure    Port placed      03/31/2016 - 05/12/2016 Chemotherapy    Weekly Carboplatin/Paclitaxel with XRT      03/31/2016 - 05/14/2016 Radiation Therapy    XRT in [place] with Dr. Tammi Klippel for 33 fractions over 6.5 weeks.        04/28/2016 Imaging    Chest xray- Enlarging left apical mass no occupying about 50% of the left hemithorax, significantly increased from 02/19/2016. Probable bony destruction involving the left anterior second rib.      05/06/2016 Pathology Results    02/18/14 specimen- PDL1 is low positive with 10% tumor score      05/13/2016 Pathology Results    [facility]- Alterations identified:  CDKN2A/B loss, SLIT2 G715, TP53 P151T.  Additional findings: MS-Stable, tumor mutation burden- intermediate (11Muts/Mb).      06/11/2016 Imaging    CT chest- Increased size of left lung apex mass with persistent invasion into the lateral chest wall. 2. Worsening ground-glass opacity within the left lung, now including most of the left upper lobe and lingula, likely indicating progression of lymphangitic tumor spread. 3. New area of ground-glass opacity in the right upper lobe is also concerning for lymphangitic tumor spread. 4. Slight increase in size of right apical mass.      06/11/2016 Progression    CT scan of chest demonstrates lymphangitic spread.      07/17/2016 Imaging    CT chest- No significant change in dominant necrotic left apical mass with chest wall invasion and destruction of the left second rib. 2. Surrounding fluctuating airspace opacities are favored to be secondary to postobstructive pneumonitis and radiation therapy. The distribution, appearance and fluctuation are not typical for lymphangitic spread of tumor.       HISTORY OF PRESENTING ILLNESS:  Susan Mahoney 65 y.o. female is here for follow-up of pancoast tumor of the L lung. She also has a R apical nodule that has not been biopsied. In spite of concurrent therapy CT imaging shows stability of disease, no significant change. Clinically she has had improvement in pain.   Patient is accompanied by husband  and daughter.   She has not seen Dr. Tammi Klippel yet.   Susan Mahoney states she has been eating more than she was, but still has a poor appetite. Patient states her pain is well managed.  Hemoptysis has improved since completed the antibiotics that were given to her in the last visit. She says she spits up a little blood occassionally in the morning but it is much better than it was previously.   She needs refill on pain medication. Energy is ok. She can do most of her ADL's. She dresses daily.   MEDICAL  HISTORY:  Past Medical History:  Diagnosis Date  . Anemia   . Diabetes mellitus, type 2 ([place])   . Hypertension   . Low vitamin B12 level 02/28/2016  . Obesity   . Pancoast tumor of left lung ([place]) 03/03/2016  . Sinus infection   . Tobacco abuse     SURGICAL HISTORY: Past Surgical History:  Procedure Laterality Date  . ABDOMINAL HYSTERECTOMY    . COLONOSCOPY N/A 02/18/2016   Procedure: COLONOSCOPY;  Surgeon: Daneil Dolin, MD;  Location[facility];  Service: Endoscopy;  Laterality: N/A;  . ESOPHAGOGASTRODUODENOSCOPY N/A 02/18/2016   Procedure: ESOPHAGOGASTRODUODENOSCOPY (EGD);  Surgeon: Daneil Dolin, MD;  Location: [facility];  Service: Endoscopy;  Laterality: N/A;    SOCIAL HISTORY: Social History   Social History  . Marital status: Married    Spouse name: N/A  . Number of children: N/A  . Years of education: N/A   Occupational History  . Not on file.   Social History Main Topics  . Smoking status: Former Smoker    Packs/day: 0.50    Years: 30.00    Types: Cigarettes    Quit date: 03/13/2016  . Smokeless tobacco: Never Used  . Alcohol use No  . Drug use: No  . Sexual activity: No   Other Topics Concern  . Not on file   Social History Narrative  . No narrative on file   Born in [place] Married 43 years in September of this year. 1 child, daughter. 2 grandchildren, both grandsons.  Smokes half a pack a day. Started smoking in her early 20's. Denies problems with alcohol.  Hobbies include reading, sewing, crafting.  FAMILY HISTORY: Family History  Problem Relation Age of Onset  . Cancer Father    Mother & father deceased. Mother was 83; died from natural causes. Father died of colon cancer, was 41-59. 5 sisters. Oldest is diabetic. Baby sister is borderline diabetic. All of them have some form of heart disease. All on some form of blood pressure medicine. Mother also had high blood pressure; her mother had it too.  ALLERGIES:  is allergic  to ace inhibitors and indomethacin.  MEDICATIONS:  Current Outpatient Prescriptions  Medication Sig Dispense Refill  . acetaminophen (TYLENOL) 500 MG tablet Take 1,000 mg by mouth every 6 (six) hours as needed for mild pain or moderate pain.     Marland Kitchen albuterol (PROVENTIL HFA;VENTOLIN HFA) 108 (90 BASE) MCG/ACT inhaler Inhale 1-2 puffs into the lungs every 6 (six) hours as needed for wheezing or shortness of breath. 1 Inhaler 0  . CARBOPLATIN IV Inject into the vein. Weekly with radiation    . Cetirizine HCl 10 MG CAPS Take 1 capsule (10 mg total) by mouth at bedtime. Restart after you've completed a three-day course of Benadryl.    . Cholecalciferol (VITAMIN D PO) Take 1 tablet by mouth daily. Reported on 03/31/2016    .  ferrous gluconate (FERGON) 324 MG tablet Take 1 tablet (324 mg total) by mouth 2 (two) times daily with a meal. 60 tablet 3  . glipiZIDE (GLUCOTROL) 10 MG tablet Take 5 mg by mouth daily after supper.    Marland Kitchen HYDROcodone-acetaminophen (NORCO) 5-325 MG tablet Take 1-2 tablets by mouth every 4 (four) hours as needed for moderate pain. 120 tablet 0  . levofloxacin (LEVAQUIN) 250 MG tablet Take 1 tablet (250 mg total) by mouth daily. 7 tablet 0  . lidocaine-prilocaine (EMLA) cream Apply a quarter size amount to port site 1 hour prior to chemo. Do not rub in. Cover with plastic wrap. 30 g 3  . megestrol (MEGACE) 400 MG/10ML suspension Take 10 mLs (400 mg total) by mouth daily. 480 mL 1  . ondansetron (ZOFRAN) 8 MG tablet Take 1 tablet (8 mg total) by mouth every 8 (eight) hours as needed for nausea or vomiting. 30 tablet 2  . oxymetazoline (NASAL SPRAY 12 HOUR) 0.05 % nasal spray Place 1 spray into both nostrils daily as needed for congestion. Reported on 02/28/2016    . PACLitaxel (TAXOL) 300 MG/50ML injection Inject into the vein. Weekly with radiation    . pantoprazole (PROTONIX) 40 MG tablet Take 1 tablet (40 mg total) by mouth 2 (two) times daily before a meal. 60 tablet 3  . potassium  chloride SA (K-DUR,KLOR-CON) 20 MEQ tablet Take 1 tablet (20 mEq total) by mouth 2 (two) times daily. 30 tablet 2  . prochlorperazine (COMPAZINE) 10 MG tablet Take 1 tablet (10 mg total) by mouth every 6 (six) hours as needed for nausea or vomiting. 30 tablet 2  . sucralfate (CARAFATE) 1 g tablet Take 1 tablet (1 g total) by mouth 4 (four) times daily -  with meals and at bedtime. 120 tablet 0  . triamterene-hydrochlorothiazide (DYAZIDE) 37.5-25 MG per capsule Take 1 each (1 capsule total) by mouth daily.     No current facility-administered medications for this visit.     Review of Systems  Constitutional: Positive for malaise/fatigue and weight loss (6 lbs since 05/14/16). Negative for diaphoresis.       Appetite loss  HENT: Negative for hearing loss and tinnitus.   Eyes: Negative.  Negative for blurred vision, double vision, photophobia, pain, discharge and redness.  Respiratory: Positive for hemoptysis (significantly improved). Negative for shortness of breath and wheezing.        Occasional hemoptysis  Cardiovascular: Negative.  Negative for chest pain, palpitations, orthopnea, claudication, leg swelling and PND.  Gastrointestinal: Positive for nausea. Negative for abdominal pain, blood in stool, constipation, diarrhea, heartburn, melena and vomiting.       Intermittent nausea  Genitourinary: Negative.  Negative for dysuria, flank pain, frequency, hematuria and urgency.  Musculoskeletal: Negative for back pain, falls, myalgias and neck pain.       Chronic R knee pain.   Skin: Negative.  Negative for itching and rash.  Neurological: Positive for weakness. Negative for dizziness, tingling, tremors, sensory change, speech change, focal weakness, seizures, loss of consciousness and headaches.  Endo/Heme/Allergies: Negative.   Psychiatric/Behavioral: Negative for depression, hallucinations, memory loss, substance abuse and suicidal ideas. The patient is not nervous/anxious.   All other  systems reviewed and are negative.  14 point ROS was done and is otherwise as detailed above or in HPI  PHYSICAL EXAMINATION: ECOG PERFORMANCE STATUS: 1 - Symptomatic but completely ambulatory  Vitals:   07/23/16 1340  BP: 132/65  Pulse: 69  Resp: 16  Temp: 98.5 F (  36.9 C)   Filed Weights   07/23/16 1340  Weight: 157 lb 6.4 oz (71.4 kg)    Physical Exam  Constitutional: She is oriented to person, place, and time and well-developed, well-nourished, and in no distress.  Obese  HENT:  Head: Normocephalic and atraumatic.  Nose: Nose normal.  Mouth/Throat: Oropharynx is clear and moist. No oropharyngeal exudate.  Eyes: Conjunctivae and EOM are normal. Pupils are equal, round, and reactive to light. Right eye exhibits no discharge. Left eye exhibits no discharge. No scleral icterus.  Neck: Normal range of motion. Neck supple. No tracheal deviation present. No thyromegaly present.  Cardiovascular: Normal rate, regular rhythm, normal heart sounds and intact distal pulses.  Exam reveals no gallop and no friction rub.   No murmur heard. Pulmonary/Chest: Effort normal and breath sounds normal. She has no wheezes. She has no rales.  Abdominal: Soft. Bowel sounds are normal. She exhibits no distension and no mass. There is no tenderness. There is no rebound and no guarding.  Musculoskeletal: Normal range of motion. She exhibits no edema.  Lymphadenopathy:    She has no cervical adenopathy.  Neurological: She is alert and oriented to person, place, and time. She has normal reflexes. No cranial nerve deficit. Gait normal. Coordination normal.  Skin: Skin is warm and dry. No rash noted.  Psychiatric: Mood, memory, affect and judgment normal.  Nursing note and vitals reviewed.   LABORATORY DATA:  I have reviewed the data as listed Lab Results  Component Value Date   WBC 5.5 07/21/2016   HGB 10.0 (L) 07/21/2016   HCT 32.0 (L) 07/21/2016   MCV 81.4 07/21/2016   PLT 297 07/21/2016    CMP     Component Value Date/Time   NA 139 07/21/2016 1359   K 3.6 07/21/2016 1359   CL 110 07/21/2016 1359   CO2 21 (L) 07/21/2016 1359   GLUCOSE 131 (H) 07/21/2016 1359   BUN 24 (H) 07/21/2016 1359   CREATININE 1.20 (H) 07/21/2016 1359   CALCIUM 9.9 07/21/2016 1359   PROT 7.6 07/21/2016 1359   ALBUMIN 3.1 (L) 07/21/2016 1359   AST 23 07/21/2016 1359   ALT 21 07/21/2016 1359   ALKPHOS 88 07/21/2016 1359   BILITOT 0.5 07/21/2016 1359   GFRNONAA 47 (L) 07/21/2016 1359   GFRAA 55 (L) 07/21/2016 1359     RADIOGRAPHIC STUDIES: I have personally reviewed the radiological images as listed and agreed with the findings in the report. No results found.  Study Result   CLINICAL DATA:  History of left lung Pancoast tumor diagnosed in May of 2017. Restaging post chemotherapy and radiation therapy.  EXAM: CT CHEST WITH CONTRAST  TECHNIQUE: Multidetector CT imaging of the chest was performed during intravenous contrast administration.  CONTRAST:  53m ISOVUE-300 IOPAMIDOL (ISOVUE-300) INJECTION 61%  COMPARISON:  Chest CT 09/19/2013, 02/17/2016 and 06/11/2016. PET-CT 03/12/2016.  FINDINGS: Cardiovascular: No acute vascular findings are seen. There is diffuse atherosclerosis of the aorta, great vessels and coronary arteries. There is a right IJ Port-A-Cath with its tip near the SVC right atrial junction. The heart size is normal. There is no pericardial effusion.  Mediastinum/Nodes: There are no enlarged mediastinal, hilar or axillary lymph nodes. There is stable diffuse thyroid nodularity. The esophagus and trachea demonstrate no significant findings.  Lungs/Pleura: Trace pleural fluid dependently on the left. No significant pleural fluid on the right. No pneumothorax. Large necrotic left apical mass is again noted with associated chest wall invasion. This currently measures approximately 11.5 x  6.8 x 8.6 cm (previously 11.9 x 6.8 x 8.8 cm). Surrounding  parenchymal opacity in the left upper lobe appears about the same, although has mildly worsened in the superior segment of the lower lobe. Patchy airspace opacity in the right upper lobe has improved. 3.3 x 2.9 cm right apical lesion appears unchanged. No new pulmonary nodules are seen.  Upper abdomen: The visualized upper abdomen appears stable without suspicious findings. There is no adrenal mass. Bilateral renal cysts are present. Chronic collaterals in the porta hepatis.  Musculoskeletal/Chest wall: Left apical chest wall invasion and partial destruction/pathologic fracture of the left second rib appear unchanged. No new osseous findings seen.  IMPRESSION: 1. No significant change in dominant necrotic left apical mass with chest wall invasion and destruction of the left second rib. 2. Surrounding fluctuating airspace opacities are favored to be secondary to postobstructive pneumonitis and radiation therapy. The distribution, appearance and fluctuation are not typical for lymphangitic spread of tumor. 3. Stable thyroid nodularity. 4. Diffuse atherosclerosis.   Electronically Signed   By: Richardean Sale M.D.   On: 07/17/2016 15:11    PATHOLOGY:    ASSESSMENT & PLAN:  LUL pancoast tumor with chest wall invasion Symptomatic Anemia Iron deficiency  Low B12 Cancer related pain Concurrent Chemo/XRT Hyperglycemia Anxiety about health Hypotension due to drug Hemoptysis Weight loss Cancer cachexia  Left pancoast tumor with direct invasion into the chest wall (T3N0M0), measuring 9.6 cm in largest dimension with PET scan on 03/12/2016 demonstrating direct chest wall invasion and findings suspicious for lymphangitic spread. Biopsy unable identify phenotype but poorly differentiated. She completed concurrent chemo/xrt, imaging suggests stable disease. She also has a RUL nodule, not biopsied, but certainly suspicious for stage IV disease. She has had hemoptysis, now  improved since recent course of antibiotics.   Her anemia is persistent.  No further evidence of iron deficiency. She has completed B12 X 4. Will continue B12 monthly.At some point if she continues to require transfusion, may need to consider BMBX.  [facility] testing was reviewed with the patient in detial. 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 I would recommend proceeding with Opdivo.   We discussed immunotherapy in detail. I reviewed risks/benefits. She will be set up for formal teaching.   I have refilled pain medication.  Follow up after she has started immunotherapy.  All questions were answered. The patient knows to call the clinic with any problems, questions or concerns.  This document serves as a record of services personally performed by Ancil Linsey, MD. It was created on her behalf by Elmyra Ricks, a trained medical scribe. The creation of this record is based on the scribe's personal observations and the provider's statements to them. This document has been checked and approved by the attending provider.  I have reviewed the above documentation for accuracy and completeness and I agree with the above.  This note was electronically signed.    Molli Hazard, MD  07/23/2016 1:52 PM

## 2016-07-23 NOTE — Progress Notes (Addendum)
ALERT: Recent Pathways Treatment decision is outdated. Please await next Pathways decision 

## 2016-07-23 NOTE — Progress Notes (Signed)
START ON PATHWAY REGIMEN - Non-Small Cell Lung  TMY111: Nivolumab 240 mg q14 Days Until Progression or Unacceptable Toxicity   A cycle is every 14 days:     Nivolumab (Opdivo(R)) 240 mg flat dose in 100 mL NS IV over 60 minutes. Inline filter required (low protein binding) Dose Mod: None Additional Orders: Severe immune-mediated reactions can occur (e.g. pneumonitis, colitis, and hepatitis). See prescribing information for more details including monitoring and required immediate management with steroids. Monitor thyroid, renal, liver  function tests, glucose, and sodium at baseline and periodically during therapy.  **Always confirm dose/schedule in your pharmacy ordering system**    Patient Characteristics: Stage IV Metastatic, Non Squamous, Second Line - Chemotherapy/Immunotherapy, PS = 0, 1, No Prior PD-1/PD-L1  Inhibitor and Immunotherapy Candidate AJCC M Stage: X AJCC N Stage: X AJCC T Stage: X Current Disease Status: Distant Metastases AJCC Stage Grouping: IV Histology: Non Squamous Cell ROS1 Rearrangement Status: Negative T790M Mutation Status: Not Applicable - EGFR Mutation Negative/Unknown Other Mutations/Biomarkers: No Other Actionable Mutations PD-L1 Expression Status: Quantity Not Sufficient Chemotherapy/Immunotherapy LOT: Second Line Chemotherapy/Immunotherapy Molecular Targeted Therapy: Not Appropriate ALK Translocation Status: Negative Would you be surprised if this patient died  in the next year? I would be surprised if this patient died in the next year EGFR Mutation Status: Non-Sensitizing BRAF V600E Mutation Status: Negative Performance Status: PS = 0, 1 Immunotherapy Candidate Status: Candidate for Immunotherapy Prior Immunotherapy Status: No Prior PD-1/PD-L1 Inhibitor  Intent of Therapy: Non-Curative / Palliative Intent, Discussed with Patient

## 2016-07-23 NOTE — Progress Notes (Signed)
Met with pt face to face.  Introduced myself and explain a little bit about my role as the patient navigator.  Pt given a card with all my information on it.  Told pt to call if they had any questions or concerns.  Pt verbalized understanding.  Set pt up for chemo teaching next Wednesday and first treatment will begin next Friday.

## 2016-07-23 NOTE — Patient Instructions (Addendum)
[place] at [facility] Discharge Instructions  RECOMMENDATIONS MADE BY THE CONSULTANT AND ANY TEST RESULTS WILL BE SENT TO YOUR REFERRING PHYSICIAN.  You saw Dr.Penland today. Chemo teaching next Wednesday You will start opdivo next Friday.  See Amy at checkout for appointments.   Thank you for choosing [place] at [facility] to provide your oncology and hematology care.  To afford each patient quality time with our provider, please arrive at least 15 minutes before your scheduled appointment time.   Beginning January 23rd 2017 lab work for the [facility] will be done in the  Main lab at [facility] on 1st floor. If you have a lab appointment with the [facility] please come in thru the  Main Entrance and check in at the main information desk  You need to re-schedule your appointment should you arrive 10 or more minutes late.  We strive to give you quality time with our providers, and arriving late affects you and other patients whose appointments are after yours.  Also, if you no show three or more times for appointments you may be dismissed from the clinic at the providers discretion.     Again, thank you for choosing [facility].  Our hope is that these requests will decrease the amount of time that you wait before being seen by our physicians.       _____________________________________________________________  Should you have questions after your visit to [facility], please contact our office at [phone] between the hours of 8:30 a.m. and 4:30 p.m.  Voicemails left after 4:30 p.m. will not be returned until the following business day.  For prescription refill requests, have your pharmacy contact our office.         Resources For Cancer Patients and their Caregivers ? American Cancer Society: Can assist with transportation, wigs, general needs, runs Look Good Feel Better.         662 476 7142 ? Cancer Care: Provides financial assistance, online support groups, medication/co-pay assistance.  1-800-813-HOPE 404-338-3942) ? [place] Assists [place] cancer patients and their families through emotional , educational and financial support.  848-825-8110 ? [place] DSS Where to apply for food stamps, Medicaid and utility assistance. 618-798-6543 ? [facility]: Transportation to medical appointments. 289-793-3494 ? Social Security Administration: May apply for disability if have a Stage IV cancer. (619)493-4506 859-791-6785 ? [employer], Disability and Transit Services: Assists with nutrition, care and transit needs. [facility] Support Programs: '@10RELATIVEDAYS'$ @ > Cancer Support Group  2nd Tuesday of the month 1pm-2pm, Journey Room  > Creative Journey  3rd Tuesday of the month 1130am-1pm, Journey Room  > Look Good Feel Better  1st Wednesday of the month 10am-12 noon, Journey Room (Call [place] to register [phone])

## 2016-07-24 ENCOUNTER — Encounter (HOSPITAL_COMMUNITY): Payer: Self-pay | Admitting: Emergency Medicine

## 2016-07-24 MED ORDER — PREDNISONE 10 MG PO TABS: ORAL_TABLET | ORAL | 0 refills | Status: DC

## 2016-07-24 NOTE — Progress Notes (Signed)
Chemotherapy education pulled together, meds e-scribed, labs entered, chemo/doctors appts made.

## 2016-07-24 NOTE — Patient Instructions (Signed)
[facility]  We completed [facility] testing and based on this you are a candidate for an immunotherapy called Opdivo (Nivolumab).  This treatment is with palliative intent.  This means that your lung cancer is treatable but not curable.  You will receive opdivo every 2 weeks until you progress or you can no longer tolerate the drug. You will see the doctor regularly throughout treatment.  We monitor your lab work prior to every treatment.  The doctor monitors your response to treatment by the way you are feeling, your blood work, and scans periodically.   Nivolumab (Generic Name) Other Names: Opdivo  About this Drug Nivolumab is used to treat cancer. It is given in the vein (IV). (Takes about 1 hour to infuse and requires no pre-medications)   POTENTIAL SIDE EFFECTS OF TREATMENT:  Possible Side Effects (More Common) . Rash . Changes in your liver function. Your doctor will check your liver function as needed. . This drug may affect how your kidneys work. Your kidney function will be checked as needed. . Electrolyte changes. Your blood will be checked for electrolyte changes as needed.  Possible Side Effects (Less Common) . Colitis, which is swelling in the colon. The symptoms are loose bowel movements (diarrhea) stomach cramping, and sometimes blood in the bowel movements. (you have been prescribed prednisone if this does happen, but you are to still notify the [facility] that you are having the diarrhea and you started to take the prednisone) . Changes in the lung tissue may happen. These changes may not last forever, and your lung tissue may go back to normal. Sometimes these changes may not be seen for many years. You may get a cough or have trouble catching your breath.  Treating Side Effects . If you get a rash do not put anything on it unless your doctor or nurse says you may. Keep the area around the rash clean and dry. Ask your  doctor for medicine if your rash bothers you. . Drink 6-8 cups of fluids each day unless your doctor has told you to limit your fluid intake due to some other health problem. A cup is 8 ounces of fluid. If you throw up or have loose bowel movements, you should drink more fluids so that you do not become dehydrated (lack of water in the body from losing too much fluid). . Ask your doctor or nurse about medicine that is available to help stop or lessen the loose bowel movements  Food and Drug Interactions There are no known interactions of nivolumab with food. This drug may interact with other medicines. Tell your doctor and pharmacist about all the medicines and dietary supplements (vitamins, minerals, herbs and others) that you are taking at this time. The safety and use of dietary supplements and alternative diets are often not known. Using these might affect your cancer or interfere with your treatment. Until more is known, you should not use dietary supplements or alternative diets without your cancer doctor's help.  When to Call the Doctor Call your doctor or nurse right away if you have any of these symptoms: . Loose bowel movements (diarrhea) 4 times in one day or diarrhea with lack of strength or a feeling of being dizzy . Fever of 100.5 F (38 C) or higher . Chills . Easy bleeding or bruising . Wheezing or trouble breathing . Rash or itching . Feeling dizzy or lightheaded . Nausea that stops you from eating or  drinking . Signs of liver problems: dark urine, pale bowel movements, bad stomach pain, feeling very tired and weak, unusual itching, or yellowing of the eyes or skin  Call your doctor or nurse as soon as possible if any of these symptoms happen: . Decreased Urine . Blurred vision or other changes in eyesight . Rash that is not relieved by prescribed medicines . Swelling of legs, ankles, or feet . Weight gain of 5 pounds in one week (fluid retention) . Fatigue that interferes  with your daily activities . Headache that does not go away . Extreme weakness that interferes with normal activities  Reproduction Concerns . Pregnancy warning: This drug may have harmful effects on the unborn child, so effective methods of birth control should be used during your cancer treatment and for at least 5 months after treatment has been stopped. . Breast feeding warning: It is not known if this drug passes into breast milk. For this reason, women should talk to their doctor about the risks and benefits of breast feeding during treatment with this drug because this drug may enter the breast milk and badly harm a breast feeding baby    EDUCATIONAL MATERIALS GIVEN AND REVIEWED: Chemotherapy and you book, nutrition book, and information on opdivo    SELF CARE ACTIVITIES WHILE ON CHEMOTHERAPY: Hydration Increase your fluid intake 48 hours prior to treatment and drink at least 8 to 12 cups (64 ounces) of water/decaff beverages per day after treatment. You can still have your cup of coffee or soda but these beverages do not count as part of your 8 to 12 cups that you need to drink daily. No alcohol intake.  Medications Continue taking your normal prescription medication as prescribed.  If you start any new herbal or new supplements please let us know first to make sure it is safe.  Mouth Care Have teeth cleaned professionally before starting treatment. Keep dentures and partial plates clean. Use soft toothbrush and do not use mouthwashes that contain alcohol. Biotene is a good mouthwash that is available at most pharmacies or may be ordered by calling [phone]. Use warm salt water gargles (1 teaspoon salt per 1 quart warm water) before and after meals and at bedtime. Or you may rinse with 2 tablespoons of three-percent hydrogen peroxide mixed in eight ounces of water. If you are still having problems with your mouth or sores in your mouth please call the clinic. If you need dental  work, please let Dr. Whitney Muse know before you go for your appointment so that we can coordinate the best possible time for you in regards to your chemo regimen. You need to also let your dentist know that you are actively taking chemo. We may need to do labs prior to your dental appointment.   Skin Care Always use sunscreen that has not expired and with SPF (Sun Protection Factor) of 50 or higher. Wear hats to protect your head from the sun. Remember to use sunscreen on your hands, ears, face, & feet.  Use good moisturizing lotions such as udder cream, eucerin, or even Vaseline. Some chemotherapies can cause dry skin, color changes in your skin and nails.    . Avoid long, hot showers or baths. . Use gentle, fragrance-free soaps and laundry detergent. . Use moisturizers, preferably creams or ointments rather than lotions because the thicker consistency is better at preventing skin dehydration. Apply the cream or ointment within 15 minutes of showering. Reapply moisturizer at night, and moisturize your hands every  time after you wash them.  Hair Loss (if your doctor says your hair will fall out)  . If your doctor says that your hair is likely to fall out, decide before you begin chemo whether you want to wear a wig. You may want to shop before treatment to match your hair color. . Hats, turbans, and scarves can also camouflage hair loss, although some people prefer to leave their heads uncovered. If you go bare-headed outdoors, be sure to use sunscreen on your scalp. . Cut your hair short. It eases the inconvenience of shedding lots of hair, but it also can reduce the emotional impact of watching your hair fall out. . Don't perm or color your hair during chemotherapy. Those chemical treatments are already damaging to hair and can enhance hair loss. Once your chemo treatments are done and your hair has grown back, it's OK to resume dyeing or perming hair. With chemotherapy, hair loss is almost always  temporary. But when it grows back, it may be a different color or texture. In older adults who still had hair color before chemotherapy, the new growth may be completely gray.  Often, new hair is very fine and soft.  Infection Prevention Please wash your hands for at least 30 seconds using warm soapy water. Handwashing is the #1 way to prevent the spread of germs. Stay away from sick people or people who are getting over a cold. If you develop respiratory systems such as green/yellow mucus production or productive cough or persistent cough let us know and we will see if you need an antibiotic. It is a good idea to keep a pair of gloves on when going into grocery stores/[employer] to decrease your risk of coming into contact with germs on the carts, etc. Carry alcohol hand gel with you at all times and use it frequently if out in public. If your temperature reaches 100.5 or higher please call the clinic and let us know.  If it is after hours or on the weekend please go to the ER if your temperature is over 100.5.  Please have your own personal thermometer at home to use.    Sex and bodily fluids If you are going to have sex, a condom must be used to protect the person that isn't taking chemotherapy. Chemo can decrease your libido (sex drive). For a few days after chemotherapy, chemotherapy can be excreted through your bodily fluids.  When using the toilet please close the lid and flush the toilet twice.  Do this for a few day after you have had chemotherapy.     Effects of chemotherapy on your sex life Some changes are simple and won't last long. They won't affect your sex life permanently. Sometimes you may feel: . too tired . not strong enough to be very active . sick or sore  . not in the mood . anxious or low Your anxiety might not seem related to sex. For example, you may be worried about the cancer and how your treatment is going. Or you may be worried about money, or about how you family are  coping with your illness. These things can cause stress, which can affect your interest in sex. It's important to talk to your partner about how you feel. Remember - the changes to your sex life don't usually last long. There's usually no medical reason to stop having sex during chemo. The drugs won't have any long term physical effects on your performance or enjoyment of sex.  Cancer can't be passed on to your partner during sex  Contraception It's important to use reliable contraception during treatment. Avoid getting pregnant while you or your partner are having chemotherapy. This is because the drugs may harm the baby. Sometimes chemotherapy drugs can leave a man or woman infertile.  This means you would not be able to have children in the future. You might want to talk to someone about permanent infertility. It can be very difficult to learn that you may no longer be able to have children. Some people find counselling helpful. There might be ways to preserve your fertility, although this is easier for men than for women. You may want to speak to a fertility expert. You can talk about sperm banking or harvesting your eggs. You can also ask about other fertility options, such as donor eggs. If you have or have had breast cancer, your doctor might advise you not to take the contraceptive pill. This is because the hormones in it might affect the cancer.  It is not known for sure whether or not chemotherapy drugs can be passed on through semen or secretions from the vagina. Because of this some doctors advise people to use a barrier method (such as condoms, femidoms or dental dams) if you have sex during treatment. This applies to vaginal, anal or oral sex. Generally, doctors advise a barrier method only for the time you are actually having the treatment and for about a week after your treatment. Advice like this can be worrying, but this does not mean that you have to avoid being intimate with your  partner. You can still have close contact with your partner and continue to enjoy sex.  Animals If you have cats or birds we just ask that you not change the litter or change the cage.  Please have someone else do this for you while you are on chemotherapy.   Food Safety During and After Cancer Treatment Food safety is important for people both during and after cancer treatment. Cancer and cancer treatments, such as chemotherapy, radiation therapy, and stem cell/bone marrow transplantation, often weaken the immune system. This makes it harder for your body to protect itself from foodborne illness, also called food poisoning. Foodborne illness is caused by eating food that contains harmful bacteria, parasites, or viruses.  Foods to avoid Some foods have a higher risk of becoming tainted with bacteria. These include: Marland Kitchen Unwashed fresh fruit and vegetables, especially leafy vegetables that can hide dirt and other contaminants . Raw sprouts, such as alfalfa sprouts . Raw or undercooked beef, especially ground beef, or other raw or undercooked meat and poultry . Cold hot dogs or deli lunch meat (cold cuts), including dry-cured, uncooked salami. Always cook or reheat these foods until they are steaming hot. . Fatty, fried, or spicy foods immediately before or after treatment.  These can sit heavy on your stomach and make you feel nauseous. . Raw or undercooked shellfish, such as oysters. . Sushi and sashimi, which often contain raw fish.  . Unpasteurized beverages, such as unpasteurized fruit juices, raw milk, raw yogurt, or cider . Soft cheeses made from unpasteurized milk, such as blue-veined (a type of blue cheese), Brie, Camembert, feta, goat cheese, and queso fresco or blanco . Undercooked eggs, such as soft boiled, over easy, and poached; raw, unpasteurized eggs; or foods made with raw egg, such as homemade raw cookie dough and homemade mayonnaise . Deli-prepared salads with egg, ham, chicken, or  seafood Simple steps for  food Education officer, museum. . Do not buy food stored or displayed in an unclean area. . Do not buy bruised or damaged fruits or vegetables. . Do not buy cans that have cracks, dents, or bulges. . Pick up foods that can spoil at the end of your shopping trip and store them in a cooler on the way home. Prepare and clean up foods carefully. . Rinse all fresh fruits and vegetables under running water, and dry them with a clean towel or paper towel. . Clean the top of cans before opening them. . After preparing food, wash your hands for 20 seconds with hot water and soap. Pay special attention to areas between fingers and under nails. . Clean your utensils and dishes with hot water and soap. Marland Kitchen Disinfect your kitchen and cutting boards using 1 teaspoon of liquid, unscented bleach mixed into 1 quart of water.   Prevent cross-contamination. Marland Kitchen Keep raw meat, poultry, and fish or their juices away from other food. Bacteria can spread through contact with the food or its liquid, causing cross-contamination. . Do not rinse raw meat or poultry because it can spread bacteria to nearby surfaces. Wendee Copp all items you used for preparing raw foods, including utensils, cutting board, and plates, before using them for other foods or cooked meat. . Set aside a specific cutting board for preparing uncooked meat, fish, and chicken. Never use it for uncooked fruits, vegetables, or other foods. Dispose of old food. . Eat canned and packaged food before its expiration date (the "use by" or "best before" date). . Consume refrigerated leftovers within 3 to 4 days. After that time, throw out the food. Even if the food does not smell or look spoiled, it still may be unsafe. Some bacteria, such as Listeria, can grow even on foods stored in the refrigerator if they are kept for too long. Take precautions when eating out. . At restaurants, avoid buffets and salad bars where food sits out for a long time  and comes in contact with many people. Food can become contaminated when someone with a virus, often a norovirus, or another "bug" handles it. . Put any leftover food in a "to-go" container yourself, rather than having the server do it. And, refrigerate leftovers as soon as you get home. . Choose restaurants that are clean and that are willing to prepare your food as you order it cooked. Cook food to the right temperature. Use a food thermometer to check for a safe internal temperature of all poultry and meat. For instance, a hamburger should be cooked to at least medium (160?F or 71?C). Get a full list of recommended internal cooking temperatures on the website of the U.S. Food and Drug Administration (FDA).  Chill food promptly. Refrigerate or freeze perishable food within 2 hours of cooking or buying it (sooner in warm weather.) Proper cooking destroys bacteria, but they can still grow on cooked food that is left out too long. Food stored in the refrigerator should be kept at below 40?F (4?C). And, food stored in the freezer should be kept below 32?F (0?C). Thaw food properly. Thaw frozen food in the refrigerator rather than at room temperature. You can also thaw food in frequently changed cold water or in the microwave, but cook it as soon as it thaws.   MEDICATIONS: Prednisone 10 mg tablet:  Take at the onset of diarrhea.  Take 7 tablets (70 mg) at one time.  Then call the [facility].    Zofran/Ondansetron  $'8mg'l$  tablet. Take 1 tablet every 8 hours as needed for nausea/vomiting. (#1 nausea med to take, this can constipate)  Compazine/Prochlorperazine '10mg'$  tablet. Take 1 tablet every 6 hours as needed for nausea/vomiting. (#2 nausea med to take, this can make you sleepy)   EMLA cream. Apply a quarter size amount to port site 1 hour prior to chemo. Do not rub in. Cover with plastic wrap.   Over-the-Counter Meds:  Miralax 1 capful in 8 oz of fluid daily. May increase to two times a day if  needed. This is a stool softener. If this doesn't work proceed you can add:  Senokot S-start with 1 tablet two times a day and increase to 4 tablets two times a day if needed. (total of 8 tablets in a 24 hour period). This is a stimulant laxative.   Call us if this does not help your bowels move.   Imodium '2mg'$  capsule. Take 2 capsules after the 1st loose stool and then 1 capsule every 2 hours until you go a total of 12 hours without having a loose stool. Call the [facility] if loose stools continue. If diarrhea occurs @ bedtime, take 2 capsules @ bedtime. Then take 2 capsules every 4 hours until morning. Call [facility].     Diarrhea Sheet  If you are having loose stools/diarrhea, please purchase Imodium and begin taking as outlined:  At the first sign of poorly formed or loose stools you should begin taking Imodium(loperamide) 2 mg capsules.  Take two caplets ('4mg'$ ) followed by one caplet ('2mg'$ ) every 2 hours until you have had no diarrhea for 12 hours.  During the night take two caplets ('4mg'$ ) at bedtime and continue every 4 hours during the night until the morning.  Stop taking Imodium only after there is no sign of diarrhea for 12 hours.    Always call the [facility] if you are having loose stools/diarrhea that you can't get under control.  Loose stools/disrrhea leads to dehydration (loss of water) in your body.  We have other options of trying to get the loose stools/diarrhea to stopped but you must let us know!      Constipation Sheet *Miralax in 8 oz of fluid daily.  May increase to two times a day if needed.  This is a stool softener.  If this not enough to keep your bowel regular:  You can add:  *Senokot S, start with one tablet twice a day and can increase to 4 tablets twice a day if needed.  This is a stimulant laxative.   Sometimes when you take pain medication you need BOTH a medicine to keep your stool soft and a medicine to help your bowel push it out!  Please call  if the above does not work for you.   Do not go more than 2 days without a bowel movement.  It is very important that you do not become constipated.  It will make you feel sick to your stomach (nausea) and can cause abdominal pain and vomiting.      Nausea Sheet  Zofran/Ondansetron '8mg'$  tablet. Take 1 tablet every 8 hours as needed for nausea/vomiting. (#1 nausea med to take, this can constipate)  Compazine/Prochlorperazine '10mg'$  tablet. Take 1 tablet every 6 hours as needed for nausea/vomiting. (#2 nausea med to take, this can make you sleepy)  You can take these medications together or separately.  We would first like for you to try the Ondansetron by itself and then take the Prochloperizine if  needed. But you are allowed to take both medications at the same time if your nausea is that severe.  If you are having persistent nausea (nausea that does not stop) please take these medications on a staggered schedule so that the nausea medication stays in your body.  Please call the [facility] and let us know the amount of nausea that you are experiencing.  If you begin to vomit, you need to call the [facility] and if it is the weekend and you have vomited more than one time and cant get it to stop-go to the Emergency Room.  Persistent nausea/vomiting can lead to dehydration (loss of fluid in your body) and will make you feel terrible.   Ice chips, sips of clear liquids, foods that are @ room temperature, crackers, and toast tend to be better tolerated.      SYMPTOMS TO REPORT AS SOON AS POSSIBLE AFTER TREATMENT:  FEVER GREATER THAN 100.5 F  CHILLS WITH OR WITHOUT FEVER  NAUSEA AND VOMITING THAT IS NOT CONTROLLED WITH YOUR NAUSEA MEDICATION  UNUSUAL SHORTNESS OF BREATH  UNUSUAL BRUISING OR BLEEDING  TENDERNESS IN MOUTH AND THROAT WITH OR WITHOUT PRESENCE OF ULCERS  URINARY PROBLEMS  BOWEL PROBLEMS  UNUSUAL RASH    Wear comfortable clothing and clothing appropriate for easy  access to any Portacath or PICC line. Let us know if there is anything that we can do to make your therapy better!    What to do if you need assistance after hours or on the weekends: CALL [phone].  HOLD on the line, do not hang up.  You will hear multiple messages but at the end you will be connected with a nurse triage line.  They will contact Dr Whitney Muse if necessary.  Most of the time they will be able to assist you.    Do not call the hospital operator.  Dr Whitney Muse will not answer phone calls received by them.     I have been informed and understand all of the instructions given to me and have received a copy. I have been instructed to call the clinic [phone]  or my family physician as soon as possible for continued medical care, if indicated. I do not have any more questions at this time but understand that I may call the [facility] at [phone] during office hours should I have questions or need assistance in obtaining follow-up care.

## 2016-07-28 ENCOUNTER — Other Ambulatory Visit (HOSPITAL_COMMUNITY): Payer: Self-pay | Admitting: Oncology

## 2016-07-28 DIAGNOSIS — R918 Other nonspecific abnormal finding of lung field: Secondary | ICD-10-CM

## 2016-07-28 DIAGNOSIS — D649 Anemia, unspecified: Secondary | ICD-10-CM

## 2016-07-28 MED ORDER — HYDROCODONE-ACETAMINOPHEN 5-325 MG PO TABS: 1.0000 | ORAL_TABLET | ORAL | 0 refills | Status: DC | PRN

## 2016-07-29 ENCOUNTER — Encounter (HOSPITAL_COMMUNITY): Payer: Medicaid Other

## 2016-07-29 DIAGNOSIS — C3412 Malignant neoplasm of upper lobe, left bronchus or lung: Secondary | ICD-10-CM

## 2016-07-29 DIAGNOSIS — D649 Anemia, unspecified: Secondary | ICD-10-CM

## 2016-07-31 ENCOUNTER — Encounter (HOSPITAL_BASED_OUTPATIENT_CLINIC_OR_DEPARTMENT_OTHER): Payer: Medicaid Other

## 2016-07-31 VITALS — BP 108/73 | HR 90 | Temp 98.6°F | Resp 18 | Wt 157.4 lb

## 2016-07-31 DIAGNOSIS — C3412 Malignant neoplasm of upper lobe, left bronchus or lung: Secondary | ICD-10-CM

## 2016-07-31 DIAGNOSIS — Z5111 Encounter for antineoplastic chemotherapy: Secondary | ICD-10-CM | POA: Diagnosis present

## 2016-07-31 DIAGNOSIS — Z90710 Acquired absence of both cervix and uterus: Secondary | ICD-10-CM | POA: Diagnosis not present

## 2016-07-31 DIAGNOSIS — F17210 Nicotine dependence, cigarettes, uncomplicated: Secondary | ICD-10-CM | POA: Diagnosis not present

## 2016-07-31 DIAGNOSIS — Z9889 Other specified postprocedural states: Secondary | ICD-10-CM | POA: Diagnosis not present

## 2016-07-31 DIAGNOSIS — E118 Type 2 diabetes mellitus with unspecified complications: Secondary | ICD-10-CM | POA: Diagnosis not present

## 2016-07-31 DIAGNOSIS — I10 Essential (primary) hypertension: Secondary | ICD-10-CM | POA: Diagnosis not present

## 2016-07-31 DIAGNOSIS — Z8571 Personal history of Hodgkin lymphoma: Secondary | ICD-10-CM | POA: Diagnosis not present

## 2016-07-31 LAB — CBC WITH DIFFERENTIAL/PLATELET
BASOS ABS: 0 10*3/uL (ref 0.0–0.1)
Basophils Relative: 0 %
EOS PCT: 2 %
Eosinophils Absolute: 0.1 10*3/uL (ref 0.0–0.7)
HEMATOCRIT: 29.3 % — AB (ref 36.0–46.0)
Hemoglobin: 8.8 g/dL — ABNORMAL LOW (ref 12.0–15.0)
LYMPHS PCT: 15 %
Lymphs Abs: 1 10*3/uL (ref 0.7–4.0)
MCH: 24.4 pg — ABNORMAL LOW (ref 26.0–34.0)
MCHC: 30 g/dL (ref 30.0–36.0)
MCV: 81.2 fL (ref 78.0–100.0)
MONO ABS: 0.5 10*3/uL (ref 0.1–1.0)
MONOS PCT: 8 %
NEUTROS ABS: 4.8 10*3/uL (ref 1.7–7.7)
Neutrophils Relative %: 75 %
PLATELETS: 318 10*3/uL (ref 150–400)
RBC: 3.61 MIL/uL — ABNORMAL LOW (ref 3.87–5.11)
RDW: 17.9 % — AB (ref 11.5–15.5)
WBC: 6.4 10*3/uL (ref 4.0–10.5)

## 2016-07-31 LAB — COMPREHENSIVE METABOLIC PANEL
ALT: 15 U/L (ref 14–54)
ANION GAP: 9 (ref 5–15)
AST: 21 U/L (ref 15–41)
Albumin: 2.9 g/dL — ABNORMAL LOW (ref 3.5–5.0)
Alkaline Phosphatase: 87 U/L (ref 38–126)
BILIRUBIN TOTAL: 0.3 mg/dL (ref 0.3–1.2)
BUN: 19 mg/dL (ref 6–20)
CHLORIDE: 107 mmol/L (ref 101–111)
CO2: 21 mmol/L — ABNORMAL LOW (ref 22–32)
Calcium: 9.6 mg/dL (ref 8.9–10.3)
Creatinine, Ser: 1.33 mg/dL — ABNORMAL HIGH (ref 0.44–1.00)
GFR, EST AFRICAN AMERICAN: 48 mL/min — AB (ref >60)
GFR, EST NON AFRICAN AMERICAN: 42 mL/min — AB (ref >60)
Glucose, Bld: 210 mg/dL — ABNORMAL HIGH (ref 65–99)
POTASSIUM: 3.6 mmol/L (ref 3.5–5.1)
Sodium: 137 mmol/L (ref 135–145)
TOTAL PROTEIN: 7.2 g/dL (ref 6.5–8.1)

## 2016-07-31 LAB — TSH: TSH: 1.41 u[IU]/mL (ref 0.350–4.500)

## 2016-07-31 MED ORDER — HEPARIN SOD (PORK) LOCK FLUSH 100 UNIT/ML IV SOLN
500.0000 [IU] | Freq: Once | INTRAVENOUS | Status: AC | PRN
  Administered 2016-07-31: 500 [IU]
  Filled 2016-07-31: qty 5

## 2016-07-31 MED ORDER — SODIUM CHLORIDE 0.9% FLUSH
10.0000 mL | INTRAVENOUS | Status: DC | PRN
Start: 2016-07-31 — End: 2016-07-31
  Administered 2016-07-31: 10 mL
  Filled 2016-07-31: qty 10

## 2016-07-31 MED ORDER — SODIUM CHLORIDE 0.9 % IV SOLN
Freq: Once | INTRAVENOUS | Status: AC
  Administered 2016-07-31: 10:00:00 via INTRAVENOUS

## 2016-07-31 MED ORDER — SODIUM CHLORIDE 0.9 % IV SOLN
240.0000 mg | Freq: Once | INTRAVENOUS | Status: AC
  Administered 2016-07-31: 240 mg via INTRAVENOUS
  Filled 2016-07-31: qty 4

## 2016-07-31 NOTE — Progress Notes (Signed)
Susan Mahoney tolerated chemo tx well without complaints or incident. Labs reviewed prior to administration of chemotherapy. VSS upon discharge. Pt discharged via wheelchair in satisfactory condition accompanied by husband

## 2016-07-31 NOTE — Patient Instructions (Signed)
[facility] Discharge Instructions for Patients Receiving Chemotherapy   Beginning January 23rd 2017 lab work for the [facility] will be done in the  Main lab at [facility] on 1st floor. If you have a lab appointment with the [facility] please come in thru the  Main Entrance and check in at the main information desk   Today you received the following chemotherapy agents Opdivo. Follow-up as scheduled. Call clinic for any questions or concerns  To help prevent nausea and vomiting after your treatment, we encourage you to take your nausea medication   If you develop nausea and vomiting, or diarrhea that is not controlled by your medication, call the clinic.  The clinic phone number is [phone]. Office hours are Monday-Friday 8:30am-5:00pm.  BELOW ARE SYMPTOMS THAT SHOULD BE REPORTED IMMEDIATELY:  *FEVER GREATER THAN 101.0 F  *CHILLS WITH OR WITHOUT FEVER  NAUSEA AND VOMITING THAT IS NOT CONTROLLED WITH YOUR NAUSEA MEDICATION  *UNUSUAL SHORTNESS OF BREATH  *UNUSUAL BRUISING OR BLEEDING  TENDERNESS IN MOUTH AND THROAT WITH OR WITHOUT PRESENCE OF ULCERS  *URINARY PROBLEMS  *BOWEL PROBLEMS  UNUSUAL RASH Items with * indicate a potential emergency and should be followed up as soon as possible. If you have an emergency after office hours please contact your primary care physician or go to the nearest emergency department.  Please call the clinic during office hours if you have any questions or concerns.   You may also contact the Patient Navigator at [phone] should you have any questions or need assistance in obtaining follow up care.      Resources For Cancer Patients and their Caregivers ? American Cancer Society: Can assist with transportation, wigs, general needs, runs Look Good Feel Better.        316-501-4829 ? Cancer Care: Provides financial assistance, online support groups, medication/co-pay assistance.  1-800-813-HOPE  339-858-3888) ? [place] Assists [place] cancer patients and their families through emotional , educational and financial support.  203-629-3344 ? [place] DSS Where to apply for food stamps, Medicaid and utility assistance. (629)770-2527 ? RCATS: Transportation to medical appointments. 801-817-0924 ? Social Security Administration: May apply for disability if have a Stage IV cancer. 210-512-7714 252-769-8486 ? [employer], Disability and Transit Services: Assists with nutrition, care and transit needs. [phone]

## 2016-08-07 ENCOUNTER — Other Ambulatory Visit (HOSPITAL_COMMUNITY): Payer: Self-pay | Admitting: *Deleted

## 2016-08-07 ENCOUNTER — Encounter (HOSPITAL_BASED_OUTPATIENT_CLINIC_OR_DEPARTMENT_OTHER): Payer: Medicaid Other | Admitting: Hematology & Oncology

## 2016-08-07 ENCOUNTER — Encounter (HOSPITAL_COMMUNITY): Payer: Medicaid Other

## 2016-08-07 ENCOUNTER — Encounter (HOSPITAL_COMMUNITY): Payer: Self-pay | Admitting: Hematology & Oncology

## 2016-08-07 VITALS — BP 122/61 | HR 94 | Temp 98.6°F | Resp 16 | Wt 156.5 lb

## 2016-08-07 DIAGNOSIS — R634 Abnormal weight loss: Secondary | ICD-10-CM

## 2016-08-07 DIAGNOSIS — E538 Deficiency of other specified B group vitamins: Secondary | ICD-10-CM

## 2016-08-07 DIAGNOSIS — D649 Anemia, unspecified: Secondary | ICD-10-CM

## 2016-08-07 DIAGNOSIS — C3412 Malignant neoplasm of upper lobe, left bronchus or lung: Secondary | ICD-10-CM

## 2016-08-07 DIAGNOSIS — G893 Neoplasm related pain (acute) (chronic): Secondary | ICD-10-CM

## 2016-08-07 DIAGNOSIS — R042 Hemoptysis: Secondary | ICD-10-CM

## 2016-08-07 LAB — COMPREHENSIVE METABOLIC PANEL
ALBUMIN: 3.2 g/dL — AB (ref 3.5–5.0)
ALK PHOS: 88 U/L (ref 38–126)
ALT: 12 U/L — ABNORMAL LOW (ref 14–54)
ANION GAP: 11 (ref 5–15)
AST: 16 U/L (ref 15–41)
BILIRUBIN TOTAL: 0.5 mg/dL (ref 0.3–1.2)
BUN: 17 mg/dL (ref 6–20)
CALCIUM: 9.8 mg/dL (ref 8.9–10.3)
CO2: 20 mmol/L — ABNORMAL LOW (ref 22–32)
Chloride: 108 mmol/L (ref 101–111)
Creatinine, Ser: 1.3 mg/dL — ABNORMAL HIGH (ref 0.44–1.00)
GFR calc non Af Amer: 43 mL/min — ABNORMAL LOW (ref >60)
GFR, EST AFRICAN AMERICAN: 50 mL/min — AB (ref >60)
Glucose, Bld: 141 mg/dL — ABNORMAL HIGH (ref 65–99)
POTASSIUM: 3.5 mmol/L (ref 3.5–5.1)
SODIUM: 139 mmol/L (ref 135–145)
TOTAL PROTEIN: 7.2 g/dL (ref 6.5–8.1)

## 2016-08-07 LAB — CBC WITH DIFFERENTIAL/PLATELET
BASOS PCT: 0 %
Basophils Absolute: 0 10*3/uL (ref 0.0–0.1)
EOS ABS: 0.2 10*3/uL (ref 0.0–0.7)
EOS PCT: 3 %
HEMATOCRIT: 30.3 % — AB (ref 36.0–46.0)
HEMOGLOBIN: 9.1 g/dL — AB (ref 12.0–15.0)
Lymphocytes Relative: 18 %
Lymphs Abs: 1.2 10*3/uL (ref 0.7–4.0)
MCH: 24.1 pg — AB (ref 26.0–34.0)
MCHC: 30 g/dL (ref 30.0–36.0)
MCV: 80.4 fL (ref 78.0–100.0)
MONO ABS: 0.6 10*3/uL (ref 0.1–1.0)
Monocytes Relative: 9 %
NEUTROS ABS: 4.4 10*3/uL (ref 1.7–7.7)
NEUTROS PCT: 70 %
Platelets: 289 10*3/uL (ref 150–400)
RBC: 3.77 MIL/uL — ABNORMAL LOW (ref 3.87–5.11)
RDW: 18.3 % — AB (ref 11.5–15.5)
WBC: 6.4 10*3/uL (ref 4.0–10.5)

## 2016-08-07 LAB — SAMPLE TO BLOOD BANK

## 2016-08-07 MED ORDER — MORPHINE SULFATE ER 15 MG PO TBCR: 15.0000 mg | EXTENDED_RELEASE_TABLET | Freq: Two times a day (BID) | ORAL | 0 refills | Status: DC

## 2016-08-07 NOTE — Progress Notes (Signed)
[facility]  Progress Note  Patient Care Team: [name], PA-C as PCP - General (Physician Assistant) Daneil Dolin, MD as Consulting Physician (Gastroenterology)  CHIEF COMPLAINTS:     Pancoast tumor of left lung [facility])   02/16/2016 - 02/20/2016 Hospital Admission    Symptomatic anemia, lung mass      02/17/2016 Imaging    CT chest with 9.6 x 6.1 x 6.6 cm macrolobulated mass in LUL with evidence of direct chest wall invasion, surrounding opacities ? early lymphangitic spread. No mediastinal or hilar LAD. pleural based lesion R apex prob cystic present 09/19/2013      02/18/2016 Procedure    Colonoscopy with diverticulosis, five polyps      02/19/2016 Initial Biopsy    Ct biopsy in IR      02/19/2016 Pathology Results    poorly diff malignancy, unclear phenotype. carcinoma and melanoma are ruled out, calretinin positive staining may be seen in mesotheliomas in addition to other tumors      02/19/2016 Procedure    EGD medium sized hiatal hernia, non obstructing schatzki ring, esophagitis      03/12/2016 PET scan    Hypermetabolic LUL lung mass with direct chest wall invasion. Lower level hypermetabolism within the surrounding ground-glass opacity and septal thickening, most consistent with lympangitic spread. No thoracic adenopathy.       03/20/2016 Procedure    Port placed      03/31/2016 - 05/12/2016 Chemotherapy    Weekly Carboplatin/Paclitaxel with XRT      03/31/2016 - 05/14/2016 Radiation Therapy    XRT in [place] with Dr. Tammi Klippel for 33 fractions over 6.5 weeks.        04/28/2016 Imaging    Chest xray- Enlarging left apical mass no occupying about 50% of the left hemithorax, significantly increased from 02/19/2016. Probable bony destruction involving the left anterior second rib.      05/06/2016 Pathology Results    02/18/14 specimen- PDL1 is low positive with 10% tumor score      05/13/2016 Pathology Results    [facility]- Alterations identified:  CDKN2A/B loss, SLIT2 G715, TP53 P151T.  Additional findings: MS-Stable, tumor mutation burden- intermediate (11Muts/Mb).      06/11/2016 Imaging    CT chest- Increased size of left lung apex mass with persistent invasion into the lateral chest wall. 2. Worsening ground-glass opacity within the left lung, now including most of the left upper lobe and lingula, likely indicating progression of lymphangitic tumor spread. 3. New area of ground-glass opacity in the right upper lobe is also concerning for lymphangitic tumor spread. 4. Slight increase in size of right apical mass.      06/11/2016 Progression    CT scan of chest demonstrates lymphangitic spread.      07/17/2016 Imaging    CT chest- No significant change in dominant necrotic left apical mass with chest wall invasion and destruction of the left second rib. 2. Surrounding fluctuating airspace opacities are favored to be secondary to postobstructive pneumonitis and radiation therapy. The distribution, appearance and fluctuation are not typical for lymphangitic spread of tumor.       HISTORY OF PRESENTING ILLNESS:  Susan Mahoney 65 y.o. female is here for follow-up of pancoast tumor of the L lung.   She has completed concurrent carboplatin/taxol and XRT. She is here for a follow up after her first round of Nivolumab.  Susan Mahoney is accompanied by her husband. I have reviewed the labs with the patient.  She denies coughing up blood. She notes that this has not been a problem since completing antibiotics a while back.    She says that she did well with the treatment. She had slight nausea in the evening, but it went away fast. She denies fatigue.   She says that she is sleeping at night.  She takes a pain pill every 4-4.5 hours. She can make it through most of the night before she takes a pill.   She says her appetite is okay. She says sometimes she'll eat a bite and then she doesn't want it anymore.   She denies  constipation. She denies diarrhea.   She denies chest pain. She occasionally has pain on her left side.   She denies black or bloody stools.   MEDICAL HISTORY:  Past Medical History:  Diagnosis Date  . Anemia   . Diabetes mellitus, type 2 ([place])   . Hypertension   . Low vitamin B12 level 02/28/2016  . Obesity   . Pancoast tumor of left lung ([place]) 03/03/2016  . Sinus infection   . Tobacco abuse     SURGICAL HISTORY: Past Surgical History:  Procedure Laterality Date  . ABDOMINAL HYSTERECTOMY    . COLONOSCOPY N/A 02/18/2016   Procedure: COLONOSCOPY;  Surgeon: Daneil Dolin, MD;  Location: [facility] END[facility];  Service: Endoscopy;  Laterality: N/A;  . ESOPHAGOGASTRODUODENOSCOPY N/A 02/18/2016   Procedure: ESOPHAGOGASTRODUODENOSCOPY (EGD);  Surgeon: Daneil Dolin, MD;  Location: [facility] ENDO SUITE;  Service: Endoscopy;  Laterality: N/A;    SOCIAL HISTORY: Social History   Social History  . Marital status: Married    Spouse name: N/A  . Number of children: N/A  . Years of education: N/A   Occupational History  . Not on file.   Social History Main Topics  . Smoking status: Former Smoker    Packs/day: 0.50    Years: 30.00    Types: Cigarettes    Quit date: 03/13/2016  . Smokeless tobacco: Never Used  . Alcohol use No  . Drug use: No  . Sexual activity: No   Other Topics Concern  . Not on file   Social History Narrative  . No narrative on file   Born in [place] Married 43 years in September of this year. 1 child, daughter. 2 grandchildren, both grandsons.  Smokes half a pack a day. Started smoking in her early 20's. Denies problems with alcohol.  Hobbies include reading, sewing, crafting.  FAMILY HISTORY: Family History  Problem Relation Age of Onset  . Cancer Father    Mother & father deceased. Mother was 77; died from natural causes. Father died of colon cancer, was 51-59. 5 sisters. Oldest is diabetic. Baby sister is borderline diabetic. All of them have some  form of heart disease. All on some form of blood pressure medicine. Mother also had high blood pressure; her mother had it too.  ALLERGIES:  is allergic to ace inhibitors and indomethacin.  MEDICATIONS:  Current Outpatient Prescriptions  Medication Sig Dispense Refill  . acetaminophen (TYLENOL) 500 MG tablet Take 1,000 mg by mouth every 6 (six) hours as needed for mild pain or moderate pain.     Marland Kitchen albuterol (PROVENTIL HFA;VENTOLIN HFA) 108 (90 BASE) MCG/ACT inhaler Inhale 1-2 puffs into the lungs every 6 (six) hours as needed for wheezing or shortness of breath. 1 Inhaler 0  . Cetirizine HCl 10 MG CAPS Take 1 capsule (10 mg total) by mouth at bedtime. Restart after you've completed a three-day  course of Benadryl.    . Cholecalciferol (VITAMIN D PO) Take 1 tablet by mouth daily. Reported on 03/31/2016    . ferrous gluconate (FERGON) 324 MG tablet Take 1 tablet (324 mg total) by mouth 2 (two) times daily with a meal. 60 tablet 3  . glipiZIDE (GLUCOTROL) 10 MG tablet Take 5 mg by mouth daily after supper.    Marland Kitchen HYDROcodone-acetaminophen (NORCO) 5-325 MG tablet Take 1-2 tablets by mouth every 4 (four) hours as needed for moderate pain. 120 tablet 0  . lidocaine-prilocaine (EMLA) cream Apply a quarter size amount to port site 1 hour prior to chemo. Do not rub in. Cover with plastic wrap. 30 g 3  . magic mouthwash w/lidocaine SOLN Equal parts of: Benadryl 12.35m/5mL, Viscous lidocaine 2%, and Maalox. Swish and swallow 5 mL four times a day. 360 mL 1  . megestrol (MEGACE) 400 MG/10ML suspension Take 10 mLs (400 mg total) by mouth daily. 480 mL 1  . morphine (MS CONTIN) 15 MG 12 hr tablet Take 1 tablet (15 mg total) by mouth every 12 (twelve) hours. 60 tablet 0  . Nivolumab (OPDIVO IV) Inject into the vein. Every 2 weeks    . ondansetron (ZOFRAN) 8 MG tablet Take 1 tablet (8 mg total) by mouth every 8 (eight) hours as needed for nausea or vomiting. 30 tablet 2  . oxymetazoline (NASAL SPRAY 12 HOUR) 0.05  % nasal spray Place 1 spray into both nostrils daily as needed for congestion. Reported on 02/28/2016    . pantoprazole (PROTONIX) 40 MG tablet Take 1 tablet (40 mg total) by mouth 2 (two) times daily before a meal. 60 tablet 3  . potassium chloride SA (K-DUR,KLOR-CON) 20 MEQ tablet TAKE 1 TABLET BY MOUTH TWICE DAILY 60 tablet 0  . predniSONE (DELTASONE) 10 MG tablet Take at the onset of diarrhea.  Take 7 tablets (70 mg) at one time.  Then call the [facility] 35 tablet 0  . prochlorperazine (COMPAZINE) 10 MG tablet Take 1 tablet (10 mg total) by mouth every 6 (six) hours as needed for nausea or vomiting. 30 tablet 2  . sucralfate (CARAFATE) 1 g tablet Take 1 tablet (1 g total) by mouth 4 (four) times daily -  with meals and at bedtime. 120 tablet 0  . triamterene-hydrochlorothiazide (DYAZIDE) 37.5-25 MG per capsule Take 1 each (1 capsule total) by mouth daily.     No current facility-administered medications for this visit.     Review of Systems  Constitutional: Negative.  Negative for malaise/fatigue.       Reduction in appetite.   HENT: Negative.   Eyes: Negative.   Respiratory: Negative.  Negative for hemoptysis.   Cardiovascular: Negative.  Negative for chest pain.  Gastrointestinal: Positive for nausea. Negative for blood in stool, constipation and melena.       Nausea in evening. Goes away fast.  Genitourinary: Negative.   Musculoskeletal: Negative.        Chronic R knee pain.  Occasional left side pain.  Skin: Negative.   Neurological: Negative.   Endo/Heme/Allergies: Negative.   Psychiatric/Behavioral: Negative.  The patient does not have insomnia.   All other systems reviewed and are negative. 14 point ROS was done and is otherwise as detailed above or in HPI   PHYSICAL EXAMINATION: ECOG PERFORMANCE STATUS: 1 - Symptomatic but completely ambulatory  Vitals:   08/07/16 1115  BP: 122/61  Pulse: 94  Resp: 16  Temp: 98.6 F (37 C)   Filed Weights  08/07/16 1115    Weight: 156 lb 8 oz (71 kg)     Physical Exam  Constitutional: She is oriented to person, place, and time and well-developed, well-nourished, and in no distress.  Patient was able to get on exam table without assistance.   HENT:  Head: Normocephalic and atraumatic.  Nose: Nose normal.  Mouth/Throat: Oropharynx is clear and moist. No oropharyngeal exudate.  Eyes: Conjunctivae and EOM are normal. Pupils are equal, round, and reactive to light. Right eye exhibits no discharge. Left eye exhibits no discharge. No scleral icterus.  Neck: Normal range of motion. Neck supple. No tracheal deviation present. No thyromegaly present.  Cardiovascular: Normal rate, regular rhythm, normal heart sounds and intact distal pulses.  Exam reveals no gallop and no friction rub.   No murmur heard. Pulmonary/Chest: Effort normal and breath sounds normal. She has no wheezes. She has no rales.  Abdominal: Soft. Bowel sounds are normal. She exhibits no distension and no mass. There is no tenderness. There is no rebound and no guarding.  Musculoskeletal: Normal range of motion. She exhibits no edema.  Lymphadenopathy:    She has no cervical adenopathy.  Neurological: She is alert and oriented to person, place, and time. She has normal reflexes. No cranial nerve deficit. Gait normal. Coordination normal.  Skin: Skin is warm and dry. No rash noted.  Psychiatric: Mood, memory, affect and judgment normal.  Nursing note and vitals reviewed.   LABORATORY DATA:  I have reviewed the data as listed Results for Susan, Mahoney (MRN [id number])   Ref. Range 08/07/2016 10:54  Sodium Latest Ref Range: 135 - 145 mmol/L 139  Potassium Latest Ref Range: 3.5 - 5.1 mmol/L 3.5  Chloride Latest Ref Range: 101 - 111 mmol/L 108  CO2 Latest Ref Range: 22 - 32 mmol/L 20 (L)  BUN Latest Ref Range: 6 - 20 mg/dL 17  Creatinine Latest Ref Range: 0.44 - 1.00 mg/dL 1.30 (H)  Calcium Latest Ref Range: 8.9 - 10.3 mg/dL 9.8  EGFR  (Non-African Amer.) Latest Ref Range: >60 mL/min 43 (L)  EGFR (African American) Latest Ref Range: >60 mL/min 50 (L)  Glucose Latest Ref Range: 65 - 99 mg/dL 141 (H)  Anion gap Latest Ref Range: 5 - 15  11  Alkaline Phosphatase Latest Ref Range: 38 - 126 U/L 88  Albumin Latest Ref Range: 3.5 - 5.0 g/dL 3.2 (L)  AST Latest Ref Range: 15 - 41 U/L 16  ALT Latest Ref Range: 14 - 54 U/L 12 (L)  Total Protein Latest Ref Range: 6.5 - 8.1 g/dL 7.2  Total Bilirubin Latest Ref Range: 0.3 - 1.2 mg/dL 0.5  WBC Latest Ref Range: 4.0 - 10.5 K/uL 6.4  RBC Latest Ref Range: 3.87 - 5.11 MIL/uL 3.77 (L)  Hemoglobin Latest Ref Range: 12.0 - 15.0 g/dL 9.1 (L)  HCT Latest Ref Range: 36.0 - 46.0 % 30.3 (L)  MCV Latest Ref Range: 78.0 - 100.0 fL 80.4  MCH Latest Ref Range: 26.0 - 34.0 pg 24.1 (L)  MCHC Latest Ref Range: 30.0 - 36.0 g/dL 30.0  RDW Latest Ref Range: 11.5 - 15.5 % 18.3 (H)  Platelets Latest Ref Range: 150 - 400 K/uL 289  Neutrophils Latest Units: % 70  Lymphocytes Latest Units: % 18  Monocytes Relative Latest Units: % 9  Eosinophil Latest Units: % 3  Basophil Latest Units: % 0  NEUT# Latest Ref Range: 1.7 - 7.7 K/uL 4.4  Lymphocyte # Latest Ref Range: 0.7 - 4.0 K/uL 1.2  Monocyte # Latest Ref Range: 0.1 - 1.0 K/uL 0.6  Eosinophils Absolute Latest Ref Range: 0.0 - 0.7 K/uL 0.2  Basophils Absolute Latest Ref Range: 0.0 - 0.1 K/uL 0.0     RADIOGRAPHIC STUDIES: I have personally reviewed the radiological images as listed and agreed with the findings in the report. No results found. Study Result   CLINICAL DATA:  History of left lung Pancoast tumor diagnosed in May of 2017. Restaging post chemotherapy and radiation therapy.  EXAM: CT CHEST WITH CONTRAST  TECHNIQUE: Multidetector CT imaging of the chest was performed during intravenous contrast administration.  CONTRAST:  37m ISOVUE-300 IOPAMIDOL (ISOVUE-300) INJECTION 61%  COMPARISON:  Chest CT 09/19/2013, 02/17/2016 and  06/11/2016. PET-CT 03/12/2016.  FINDINGS: Cardiovascular: No acute vascular findings are seen. There is diffuse atherosclerosis of the aorta, great vessels and coronary arteries. There is a right IJ Port-A-Cath with its tip near the SVC right atrial junction. The heart size is normal. There is no pericardial effusion.  Mediastinum/Nodes: There are no enlarged mediastinal, hilar or axillary lymph nodes. There is stable diffuse thyroid nodularity. The esophagus and trachea demonstrate no significant findings.  Lungs/Pleura: Trace pleural fluid dependently on the left. No significant pleural fluid on the right. No pneumothorax. Large necrotic left apical mass is again noted with associated chest wall invasion. This currently measures approximately 11.5 x 6.8 x 8.6 cm (previously 11.9 x 6.8 x 8.8 cm). Surrounding parenchymal opacity in the left upper lobe appears about the same, although has mildly worsened in the superior segment of the lower lobe. Patchy airspace opacity in the right upper lobe has improved. 3.3 x 2.9 cm right apical lesion appears unchanged. No new pulmonary nodules are seen.  Upper abdomen: The visualized upper abdomen appears stable without suspicious findings. There is no adrenal mass. Bilateral renal cysts are present. Chronic collaterals in the porta hepatis.  Musculoskeletal/Chest wall: Left apical chest wall invasion and partial destruction/pathologic fracture of the left second rib appear unchanged. No new osseous findings seen.  IMPRESSION: 1. No significant change in dominant necrotic left apical mass with chest wall invasion and destruction of the left second rib. 2. Surrounding fluctuating airspace opacities are favored to be secondary to postobstructive pneumonitis and radiation therapy. The distribution, appearance and fluctuation are not typical for lymphangitic spread of tumor. 3. Stable thyroid nodularity. 4. Diffuse  atherosclerosis.   Electronically Signed   By: WRichardean SaleM.D.   On: 07/17/2016 15:11       PATHOLOGY:       ASSESSMENT & PLAN:  LUL pancoast tumor with chest wall invasion Symptomatic Anemia Colonoscopy/EGD with Dr. RGala Romney5/2017 Iron deficiency  Low B12 Cancer related pain Concurrent Chemo/XRT Hyperglycemia Anxiety about health Hypotension due to drug Hemoptysis Weight loss Cancer cachexia   Stage IIB poorly differentiated left pancoast tumor with direct invasion into the chest wall (T3N0M0), measuring 9.6 cm in largest dimension with PET scan on 03/12/2016 demonstrating direct chest wall invasion and findings suspicious for lymphangitic spread. Biopsy unable identify phenotype but poorly differentiated. She completed concurrent chemo/xrt. Repeat imaging showed unfortunately little change in size of disease with therapy. Given RUL nodule, I suspect stage IV disease.   She has completed her first cycle of nivolumab. She did well. I would certainly continue with this.   Her anemia is persistent to worse.  No further evidence of iron deficiency. She has completed B12 X 4. Will continue B12 monthly. I am going to arrange for transfusion. At some point if she  continues to require transfusion, may need to consider BMBX.  I will prescribe here for morphine to take twice a day (ie long acting) and then she can continue to use her hydrocodone when she needs them.   She will return for treatment on 11/24. She will let me know if she has any problems before then.   Meds ordered this encounter  Medications  . DISCONTD: morphine (MS CONTIN) 15 MG 12 hr tablet    Sig: Take 1 tablet (15 mg total) by mouth every 12 (twelve) hours.    Dispense:  60 tablet    Refill:  0  . morphine (MS CONTIN) 15 MG 12 hr tablet    Sig: Take 1 tablet (15 mg total) by mouth every 12 (twelve) hours.    Dispense:  60 tablet    Refill:  0    All questions were answered. The patient knows to  call the clinic with any problems, questions or concerns.  This document serves as a record of services personally performed by Ancil Linsey, MD. It was created on her behalf by Martinique Casey, a trained medical scribe. The creation of this record is based on the scribe's personal observations and the provider's statements to them. This document has been checked and approved by the attending provider.  I have reviewed the above documentation for accuracy and completeness and I agree with the above.  This note was electronically signed.    Molli Hazard, MD  09/14/2016 5:59 PM

## 2016-08-07 NOTE — Patient Instructions (Signed)
[facility] at [facility] Discharge Instructions  RECOMMENDATIONS MADE BY THE CONSULTANT AND ANY TEST RESULTS WILL BE SENT TO YOUR REFERRING PHYSICIAN.  Exam with Dr. Braelynn Benning Muse today. Please return as scheduled for chemo and follow up.  Thank you for choosing [place] at [facility] to provide your oncology and hematology care.  To afford each patient quality time with our provider, please arrive at least 15 minutes before your scheduled appointment time.   Beginning January 23rd 2017 lab work for the [facility] will be done in the  Main lab at [facility] on 1st floor. If you have a lab appointment with the [place] please come in thru the  Main Entrance and check in at the main information desk  You need to re-schedule your appointment should you arrive 10 or more minutes late.  We strive to give you quality time with our providers, and arriving late affects you and other patients whose appointments are after yours.  Also, if you no show three or more times for appointments you may be dismissed from the clinic at the providers discretion.     Again, thank you for choosing [facility].  Our hope is that these requests will decrease the amount of time that you wait before being seen by our physicians.       _____________________________________________________________  Should you have questions after your visit to [facility], please contact our office at [phone] between the hours of 8:30 a.m. and 4:30 p.m.  Voicemails left after 4:30 p.m. will not be returned until the following business day.  For prescription refill requests, have your pharmacy contact our office.         Resources For Cancer Patients and their Caregivers ? American Cancer Society: Can assist with transportation, wigs, general needs, runs Look Good Feel Better.        504-755-1954 ? Cancer Care: Provides financial assistance,  online support groups, medication/co-pay assistance.  1-800-813-HOPE 347 058 8686) ? [place] Assists [place] cancer patients and their families through emotional , educational and financial support.  616-188-8204 ? [place] DSS Where to apply for food stamps, Medicaid and utility assistance. (631)063-5643 ? RCATS: Transportation to medical appointments. 7574115815 ? Social Security Administration: May apply for disability if have a Stage IV cancer. [phone] 517-750-3570 ? [employer], Disability and Transit Services: Assists with nutrition, care and transit needs. [place] Support Programs: '@10RELATIVEDAYS'$ @ > Cancer Support Group  2nd Tuesday of the month 1pm-2pm, Journey Room  > Creative Journey  3rd Tuesday of the month 1130am-1pm, Journey Room  > Look Good Feel Better  1st Wednesday of the month 10am-12 noon, Journey Room (Call [facility] to register [phone])

## 2016-08-17 ENCOUNTER — Encounter (HOSPITAL_COMMUNITY): Payer: Self-pay | Admitting: Hematology & Oncology

## 2016-08-17 ENCOUNTER — Encounter (HOSPITAL_BASED_OUTPATIENT_CLINIC_OR_DEPARTMENT_OTHER): Payer: Medicaid Other

## 2016-08-17 ENCOUNTER — Encounter (HOSPITAL_COMMUNITY): Payer: Self-pay

## 2016-08-17 VITALS — BP 123/58 | HR 77 | Temp 99.1°F | Resp 16 | Wt 159.0 lb

## 2016-08-17 DIAGNOSIS — C3412 Malignant neoplasm of upper lobe, left bronchus or lung: Secondary | ICD-10-CM | POA: Diagnosis not present

## 2016-08-17 DIAGNOSIS — Z5112 Encounter for antineoplastic immunotherapy: Secondary | ICD-10-CM | POA: Diagnosis present

## 2016-08-17 DIAGNOSIS — R918 Other nonspecific abnormal finding of lung field: Secondary | ICD-10-CM

## 2016-08-17 DIAGNOSIS — D649 Anemia, unspecified: Secondary | ICD-10-CM

## 2016-08-17 LAB — CBC WITH DIFFERENTIAL/PLATELET
BASOS PCT: 0 %
Basophils Absolute: 0 10*3/uL (ref 0.0–0.1)
EOS PCT: 3 %
Eosinophils Absolute: 0.2 10*3/uL (ref 0.0–0.7)
HEMATOCRIT: 30.2 % — AB (ref 36.0–46.0)
HEMOGLOBIN: 9.1 g/dL — AB (ref 12.0–15.0)
LYMPHS PCT: 19 %
Lymphs Abs: 1.3 10*3/uL (ref 0.7–4.0)
MCH: 23.7 pg — AB (ref 26.0–34.0)
MCHC: 30.1 g/dL (ref 30.0–36.0)
MCV: 78.6 fL (ref 78.0–100.0)
MONOS PCT: 6 %
Monocytes Absolute: 0.4 10*3/uL (ref 0.1–1.0)
NEUTROS PCT: 72 %
Neutro Abs: 5 10*3/uL (ref 1.7–7.7)
Platelets: 260 10*3/uL (ref 150–400)
RBC: 3.84 MIL/uL — ABNORMAL LOW (ref 3.87–5.11)
RDW: 19.1 % — ABNORMAL HIGH (ref 11.5–15.5)
WBC: 6.9 10*3/uL (ref 4.0–10.5)

## 2016-08-17 LAB — COMPREHENSIVE METABOLIC PANEL
ALK PHOS: 80 U/L (ref 38–126)
ALT: 10 U/L — ABNORMAL LOW (ref 14–54)
ANION GAP: 7 (ref 5–15)
AST: 19 U/L (ref 15–41)
Albumin: 2.8 g/dL — ABNORMAL LOW (ref 3.5–5.0)
BILIRUBIN TOTAL: 0.4 mg/dL (ref 0.3–1.2)
BUN: 18 mg/dL (ref 6–20)
CALCIUM: 9.8 mg/dL (ref 8.9–10.3)
CO2: 25 mmol/L (ref 22–32)
CREATININE: 1.26 mg/dL — AB (ref 0.44–1.00)
Chloride: 104 mmol/L (ref 101–111)
GFR calc non Af Amer: 44 mL/min — ABNORMAL LOW (ref >60)
GFR, EST AFRICAN AMERICAN: 51 mL/min — AB (ref >60)
GLUCOSE: 188 mg/dL — AB (ref 65–99)
Potassium: 3.8 mmol/L (ref 3.5–5.1)
Sodium: 136 mmol/L (ref 135–145)
TOTAL PROTEIN: 6.9 g/dL (ref 6.5–8.1)

## 2016-08-17 MED ORDER — SODIUM CHLORIDE 0.9 % IV SOLN
240.0000 mg | Freq: Once | INTRAVENOUS | Status: AC
  Administered 2016-08-17: 240 mg via INTRAVENOUS
  Filled 2016-08-17: qty 20

## 2016-08-17 MED ORDER — SODIUM CHLORIDE 0.9 % IV SOLN
Freq: Once | INTRAVENOUS | Status: AC
  Administered 2016-08-17: 11:00:00 via INTRAVENOUS

## 2016-08-17 MED ORDER — SODIUM CHLORIDE 0.9% FLUSH
10.0000 mL | INTRAVENOUS | Status: DC | PRN
  Administered 2016-08-17: 10 mL
  Filled 2016-08-17: qty 10

## 2016-08-17 MED ORDER — HEPARIN SOD (PORK) LOCK FLUSH 100 UNIT/ML IV SOLN
500.0000 [IU] | Freq: Once | INTRAVENOUS | Status: AC | PRN
  Administered 2016-08-17: 500 [IU]
  Filled 2016-08-17: qty 5

## 2016-08-17 MED ORDER — HYDROCODONE-ACETAMINOPHEN 5-325 MG PO TABS: 1.0000 | ORAL_TABLET | ORAL | 0 refills | Status: DC | PRN

## 2016-08-17 NOTE — Progress Notes (Signed)
Left message for [name] the dietician, patient requesting a case of chocolate ensure.  Chemotherapy given today per orders. Patient tolerated it well, no problems. Vitals stable and discharged from clinic with husband, ambulatory.follow up as scheduled.

## 2016-08-17 NOTE — Patient Instructions (Signed)
[facility] Discharge Instructions for Patients Receiving Chemotherapy   Beginning January 23rd 2017 lab work for the Cancer Center will be done in the  Main lab at [facility] on 1st floor. If you have a lab appointment with the Cancer Center please come in thru the  Main Entrance and check in at the main information desk   Today you received the following chemotherapy agents Opdivo  To help prevent nausea and vomiting after your treatment, we encourage you to take your nausea medication   If you develop nausea and vomiting, or diarrhea that is not controlled by your medication, call the clinic.  The clinic phone number is [phone]. Office hours are Monday-Friday 8:30am-5:00pm.  BELOW ARE SYMPTOMS THAT SHOULD BE REPORTED IMMEDIATELY:  *FEVER GREATER THAN 101.0 F  *CHILLS WITH OR WITHOUT FEVER  NAUSEA AND VOMITING THAT IS NOT CONTROLLED WITH YOUR NAUSEA MEDICATION  *UNUSUAL SHORTNESS OF BREATH  *UNUSUAL BRUISING OR BLEEDING  TENDERNESS IN MOUTH AND THROAT WITH OR WITHOUT PRESENCE OF ULCERS  *URINARY PROBLEMS  *BOWEL PROBLEMS  UNUSUAL RASH Items with * indicate a potential emergency and should be followed up as soon as possible. If you have an emergency after office hours please contact your primary care physician or go to the nearest emergency department.  Please call the clinic during office hours if you have any questions or concerns.   You may also contact the Patient Navigator at [phone] should you have any questions or need assistance in obtaining follow up care.      Resources For Cancer Patients and their Caregivers ? American Cancer Society: Can assist with transportation, wigs, general needs, runs Look Good Feel Better.        1-888-227-6333 ? Cancer Care: Provides financial assistance, online support groups, medication/co-pay assistance.  1-800-813-HOPE (4673) ? [facility] Assists [place] cancer  patients and their families through emotional , educational and financial support.  336-427-4357 ? [place] DSS Where to apply for food stamps, Medicaid and utility assistance. 336-342-1394 ? [employer]: Transportation to medical appointments. 336-347-2287 ? Social Security Administration: May apply for disability if have a Stage IV cancer. 336-342-7796 1-800-772-1213 ? [place] [facility]ging, Disability and Transit Services: Assists with nutrition, care and transit needs. 336-349-2343          

## 2016-08-28 ENCOUNTER — Ambulatory Visit (HOSPITAL_COMMUNITY): Payer: Medicaid Other

## 2016-08-28 ENCOUNTER — Encounter (HOSPITAL_COMMUNITY): Payer: Medicaid Other | Attending: Hematology & Oncology | Admitting: Hematology & Oncology

## 2016-08-28 ENCOUNTER — Encounter (HOSPITAL_BASED_OUTPATIENT_CLINIC_OR_DEPARTMENT_OTHER): Payer: Medicaid Other

## 2016-08-28 ENCOUNTER — Encounter (HOSPITAL_COMMUNITY): Payer: Self-pay

## 2016-08-28 ENCOUNTER — Other Ambulatory Visit (HOSPITAL_COMMUNITY): Payer: Self-pay | Admitting: Oncology

## 2016-08-28 ENCOUNTER — Encounter (HOSPITAL_COMMUNITY): Payer: Self-pay | Admitting: Hematology & Oncology

## 2016-08-28 VITALS — BP 125/64 | HR 66 | Temp 97.7°F | Resp 18

## 2016-08-28 VITALS — BP 124/61 | HR 61 | Temp 98.4°F | Resp 16 | Wt 161.2 lb

## 2016-08-28 DIAGNOSIS — F17210 Nicotine dependence, cigarettes, uncomplicated: Secondary | ICD-10-CM | POA: Diagnosis not present

## 2016-08-28 DIAGNOSIS — E538 Deficiency of other specified B group vitamins: Secondary | ICD-10-CM | POA: Diagnosis not present

## 2016-08-28 DIAGNOSIS — G893 Neoplasm related pain (acute) (chronic): Secondary | ICD-10-CM

## 2016-08-28 DIAGNOSIS — C3412 Malignant neoplasm of upper lobe, left bronchus or lung: Secondary | ICD-10-CM

## 2016-08-28 DIAGNOSIS — K117 Disturbances of salivary secretion: Secondary | ICD-10-CM | POA: Diagnosis not present

## 2016-08-28 DIAGNOSIS — I10 Essential (primary) hypertension: Secondary | ICD-10-CM | POA: Insufficient documentation

## 2016-08-28 DIAGNOSIS — Z5112 Encounter for antineoplastic immunotherapy: Secondary | ICD-10-CM | POA: Diagnosis present

## 2016-08-28 DIAGNOSIS — Z90710 Acquired absence of both cervix and uterus: Secondary | ICD-10-CM | POA: Diagnosis not present

## 2016-08-28 DIAGNOSIS — E876 Hypokalemia: Secondary | ICD-10-CM

## 2016-08-28 DIAGNOSIS — D649 Anemia, unspecified: Secondary | ICD-10-CM | POA: Diagnosis not present

## 2016-08-28 DIAGNOSIS — Z8571 Personal history of Hodgkin lymphoma: Secondary | ICD-10-CM | POA: Insufficient documentation

## 2016-08-28 DIAGNOSIS — Z9889 Other specified postprocedural states: Secondary | ICD-10-CM | POA: Insufficient documentation

## 2016-08-28 DIAGNOSIS — E118 Type 2 diabetes mellitus with unspecified complications: Secondary | ICD-10-CM | POA: Diagnosis not present

## 2016-08-28 LAB — CBC WITH DIFFERENTIAL/PLATELET
BASOS ABS: 0 10*3/uL (ref 0.0–0.1)
Basophils Relative: 0 %
Eosinophils Absolute: 0.2 10*3/uL (ref 0.0–0.7)
Eosinophils Relative: 3 %
HCT: 29.2 % — ABNORMAL LOW (ref 36.0–46.0)
Hemoglobin: 8.8 g/dL — ABNORMAL LOW (ref 12.0–15.0)
LYMPHS ABS: 1.7 10*3/uL (ref 0.7–4.0)
Lymphocytes Relative: 21 %
MCH: 23.2 pg — ABNORMAL LOW (ref 26.0–34.0)
MCHC: 30.1 g/dL (ref 30.0–36.0)
MCV: 77 fL — ABNORMAL LOW (ref 78.0–100.0)
Monocytes Absolute: 0.6 10*3/uL (ref 0.1–1.0)
Monocytes Relative: 7 %
NEUTROS PCT: 69 %
Neutro Abs: 5.4 10*3/uL (ref 1.7–7.7)
Platelets: 259 10*3/uL (ref 150–400)
RBC: 3.79 MIL/uL — AB (ref 3.87–5.11)
RDW: 19.6 % — AB (ref 11.5–15.5)
WBC: 7.9 10*3/uL (ref 4.0–10.5)

## 2016-08-28 LAB — COMPREHENSIVE METABOLIC PANEL
ALBUMIN: 2.8 g/dL — AB (ref 3.5–5.0)
ALT: 11 U/L — ABNORMAL LOW (ref 14–54)
AST: 15 U/L (ref 15–41)
Alkaline Phosphatase: 71 U/L (ref 38–126)
Anion gap: 8 (ref 5–15)
BUN: 19 mg/dL (ref 6–20)
CHLORIDE: 106 mmol/L (ref 101–111)
CO2: 26 mmol/L (ref 22–32)
Calcium: 9.2 mg/dL (ref 8.9–10.3)
Creatinine, Ser: 1.26 mg/dL — ABNORMAL HIGH (ref 0.44–1.00)
GFR calc Af Amer: 51 mL/min — ABNORMAL LOW (ref >60)
GFR calc non Af Amer: 44 mL/min — ABNORMAL LOW (ref >60)
GLUCOSE: 111 mg/dL — AB (ref 65–99)
POTASSIUM: 3.9 mmol/L (ref 3.5–5.1)
SODIUM: 140 mmol/L (ref 135–145)
Total Bilirubin: 0.4 mg/dL (ref 0.3–1.2)
Total Protein: 6.7 g/dL (ref 6.5–8.1)

## 2016-08-28 MED ORDER — SODIUM CHLORIDE 0.9 % IV SOLN
Freq: Once | INTRAVENOUS | Status: AC
  Administered 2016-08-28: 12:00:00 via INTRAVENOUS

## 2016-08-28 MED ORDER — HEPARIN SOD (PORK) LOCK FLUSH 100 UNIT/ML IV SOLN
500.0000 [IU] | Freq: Once | INTRAVENOUS | Status: AC | PRN
  Administered 2016-08-28: 500 [IU]
  Filled 2016-08-28: qty 5

## 2016-08-28 MED ORDER — SODIUM CHLORIDE 0.9% FLUSH: 10.0000 mL | INTRAVENOUS | Status: DC | PRN

## 2016-08-28 MED ORDER — MAGIC MOUTHWASH W/LIDOCAINE: ORAL | 1 refills | Status: AC

## 2016-08-28 MED ORDER — SODIUM CHLORIDE 0.9 % IV SOLN
240.0000 mg | Freq: Once | INTRAVENOUS | Status: AC
  Administered 2016-08-28: 240 mg via INTRAVENOUS
  Filled 2016-08-28: qty 20

## 2016-08-28 NOTE — Progress Notes (Signed)
Tolerated infusion w/o adverse reaction.  Alert, in no distress.  VSS.  Discharged ambulatory.  

## 2016-08-28 NOTE — Patient Instructions (Addendum)
[facility] Discharge Instructions  RECOMMENDATIONS MADE BY THE CONSULTANT AND ANY TEST RESULTS WILL BE SENT TO YOUR REFERRING PHYSICIAN.  You saw Dr.Penland today. Chemo and labs in 2 weeks. Follow up, chemo and labs in 4 weeks. See Amy at checkout for appointments.  Thank you for choosing [facility] to provide your oncology and hematology care.  To afford each patient quality time with our provider, please arrive at least 15 minutes before your scheduled appointment time.   Beginning January 23rd 2017 lab work for the [facility] will be done in the  Main lab at [facility] on 1st floor. If you have a lab appointment with the [facility] please come in thru the  Main Entrance and check in at the main information desk  You need to re-schedule your appointment should you arrive 10 or more minutes late.  We strive to give you quality time with our providers, and arriving late affects you and other patients whose appointments are after yours.  Also, if you no show three or more times for appointments you may be dismissed from the clinic at the providers discretion.     Again, thank you for choosing [facility].  Our hope is that these requests will decrease the amount of time that you wait before being seen by our physicians.       _____________________________________________________________  Should you have questions after your visit to [facility], please contact our office at [phone] between the hours of 8:30 a.m. and 4:30 p.m.  Voicemails left after 4:30 p.m. will not be returned until the following business day.  For prescription refill requests, have your pharmacy contact our office.         Resources For Cancer Patients and their Caregivers ? American Cancer Society: Can assist with transportation, wigs, general needs, runs Look Good Feel Better.         (985) 213-6772 ? Cancer Care: Provides financial assistance, online support groups, medication/co-pay assistance.  1-800-813-HOPE 925-637-7772) ? [facility] Assists [place] cancer patients and their families through emotional , educational and financial support.  367-261-3125 ? [place] DSS Where to apply for food stamps, Medicaid and utility assistance. (256)599-7929 ? [employer]: Transportation to medical appointments. (859) 218-4419 ? Social Security Administration: May apply for disability if have a Stage IV cancer. (909) 688-8207 5162549583 ? [employer], Disability and Transit Services: Assists with nutrition, care and transit needs. [place] Support Programs: '@10RELATIVEDAYS'$ @ > Cancer Support Group  2nd Tuesday of the month 1pm-2pm, Journey Room  > Creative Journey  3rd Tuesday of the month 1130am-1pm, Journey Room  > Look Good Feel Better  1st Wednesday of the month 10am-12 noon, Journey Room (Call [facility] to register [phone])

## 2016-08-28 NOTE — Patient Instructions (Signed)
[facility] Discharge Instructions for Patients Receiving Chemotherapy   Beginning January 23rd 2017 lab work for the Cancer Center will be done in the  Main lab at [place] on 1st floor. If you have a lab appointment with the Cancer Center please come in thru the  Main Entrance and check in at the main information desk   Today you received the following chemotherapy agents:  Opdivo  If you develop nausea and vomiting, or diarrhea that is not controlled by your medication, call the clinic.  The clinic phone number is [phone]. Office hours are Monday-Friday 8:30am-5:00pm.  BELOW ARE SYMPTOMS THAT SHOULD BE REPORTED IMMEDIATELY:  *FEVER GREATER THAN 101.0 F  *CHILLS WITH OR WITHOUT FEVER  NAUSEA AND VOMITING THAT IS NOT CONTROLLED WITH YOUR NAUSEA MEDICATION  *UNUSUAL SHORTNESS OF BREATH  *UNUSUAL BRUISING OR BLEEDING  TENDERNESS IN MOUTH AND THROAT WITH OR WITHOUT PRESENCE OF ULCERS  *URINARY PROBLEMS  *BOWEL PROBLEMS  UNUSUAL RASH Items with * indicate a potential emergency and should be followed up as soon as possible. If you have an emergency after office hours please contact your primary care physician or go to the nearest emergency department.  Please call the clinic during office hours if you have any questions or concerns.   You may also contact the Patient Navigator at [phone] should you have any questions or need assistance in obtaining follow up care.      Resources For Cancer Patients and their Caregivers ? American Cancer Society: Can assist with transportation, wigs, general needs, runs Look Good Feel Better.        1-888-227-6333 ? Cancer Care: Provides financial assistance, online support groups, medication/co-pay assistance.  1-800-813-HOPE (4673) ? [facility] Assists [place] cancer patients and their families through emotional , educational and financial support.   336-427-4357 ? [place] DSS Where to apply for food stamps, Medicaid and utility assistance. 336-342-1394 ? [employer]: Transportation to medical appointments. 336-347-2287 ? Social Security Administration: May apply for disability if have a Stage IV cancer. 336-342-7796 1-800-772-1213 ? [place] Aging, Disability and Transit Services: Assists with nutrition, care and transit needs. 336-349-2343         

## 2016-08-28 NOTE — Progress Notes (Signed)
[place]  Progress Note  Patient Care Team: [name], PA-C as PCP - General (Physician Assistant) Daneil Dolin, MD as Consulting Physician (Gastroenterology)  CHIEF COMPLAINTS:     Pancoast tumor of left lung [facility])   02/16/2016 - 02/20/2016 Hospital Admission    Symptomatic anemia, lung mass      02/17/2016 Imaging    CT chest with 9.6 x 6.1 x 6.6 cm macrolobulated mass in LUL with evidence of direct chest wall invasion, surrounding opacities ? early lymphangitic spread. No mediastinal or hilar LAD. pleural based lesion R apex prob cystic present 09/19/2013      02/18/2016 Procedure    Colonoscopy with diverticulosis, five polyps      02/19/2016 Initial Biopsy    Ct biopsy in IR      02/19/2016 Pathology Results    poorly diff malignancy, unclear phenotype. carcinoma and melanoma are ruled out, calretinin positive staining may be seen in mesotheliomas in addition to other tumors      02/19/2016 Procedure    EGD medium sized hiatal hernia, non obstructing schatzki ring, esophagitis      03/12/2016 PET scan    Hypermetabolic LUL lung mass with direct chest wall invasion. Lower level hypermetabolism within the surrounding ground-glass opacity and septal thickening, most consistent with lympangitic spread. No thoracic adenopathy.       03/20/2016 Procedure    Port placed      03/31/2016 - 05/12/2016 Chemotherapy    Weekly Carboplatin/Paclitaxel with XRT      03/31/2016 - 05/14/2016 Radiation Therapy    XRT in [place] with Dr. Tammi Klippel for 33 fractions over 6.5 weeks.        04/28/2016 Imaging    Chest xray- Enlarging left apical mass no occupying about 50% of the left hemithorax, significantly increased from 02/19/2016. Probable bony destruction involving the left anterior second rib.      05/06/2016 Pathology Results    02/18/14 specimen- PDL1 is low positive with 10% tumor score      05/13/2016 Pathology Results    [facility]- Alterations identified:  CDKN2A/B loss, SLIT2 G715, TP53 P151T.  Additional findings: MS-Stable, tumor mutation burden- intermediate (11Muts/Mb).      06/11/2016 Imaging    CT chest- Increased size of left lung apex mass with persistent invasion into the lateral chest wall. 2. Worsening ground-glass opacity within the left lung, now including most of the left upper lobe and lingula, likely indicating progression of lymphangitic tumor spread. 3. New area of ground-glass opacity in the right upper lobe is also concerning for lymphangitic tumor spread. 4. Slight increase in size of right apical mass.      06/11/2016 Progression    CT scan of chest demonstrates lymphangitic spread.      07/17/2016 Imaging    CT chest- No significant change in dominant necrotic left apical mass with chest wall invasion and destruction of the left second rib. 2. Surrounding fluctuating airspace opacities are favored to be secondary to postobstructive pneumonitis and radiation therapy. The distribution, appearance and fluctuation are not typical for lymphangitic spread of tumor.      07/31/2016 -  Chemotherapy    Opdivo       HISTORY OF PRESENTING ILLNESS:  Susan Mahoney 65 y.o. female is here for follow-up of pancoast tumor of the L lung. She also has a R apical nodule that has not been biopsied. In spite of concurrent therapy CT imaging shows stability of disease, no significant change. She  has stage IV disease, continues on nivolumab.  Patient is accompanied by her husband. She presents for cycle 3 of Nivolumab today.   She says that she doesn't feel as tired and wiped out on the immunotherapy (compared to previous systemic chemotherapy and XRT). She has been doing more housework and cooking.  She has not been coughing up any blood.  She still has some pain, it is about the same.  She has a better appetite. She has gained some weight.  She reports some xerostomia, "my mouth feels like I sucked on a lemon".    Denies diarrhea, constipation, or abdominal pain.  Her breathing is stable and at baseline without complaints of shortness of breath.   MEDICAL HISTORY:  Past Medical History:  Diagnosis Date  . Anemia   . Diabetes mellitus, type 2 ([place])   . Hypertension   . Low vitamin B12 level 02/28/2016  . Obesity   . Pancoast tumor of left lung ([place]) 03/03/2016  . Sinus infection   . Tobacco abuse     SURGICAL HISTORY: Past Surgical History:  Procedure Laterality Date  . ABDOMINAL HYSTERECTOMY    . COLONOSCOPY N/A 02/18/2016   Procedure: COLONOSCOPY;  Surgeon: Daneil Dolin, MD;  Location: [facility] ENDO SUITE;  Service: Endoscopy;  Laterality: N/A;  . ESOPHAGOGASTRODUODENOSCOPY N/A 02/18/2016   Procedure: ESOPHAGOGASTRODUODENOSCOPY (EGD);  Surgeon: Daneil Dolin, MD;  Location: [facility] ENDO SUITE;  Service: Endoscopy;  Laterality: N/A;    SOCIAL HISTORY: Social History   Social History  . Marital status: Married    Spouse name: N/A  . Number of children: N/A  . Years of education: N/A   Occupational History  . Not on file.   Social History Main Topics  . Smoking status: Former Smoker    Packs/day: 0.50    Years: 30.00    Types: Cigarettes    Quit date: 03/13/2016  . Smokeless tobacco: Never Used  . Alcohol use No  . Drug use: No  . Sexual activity: No   Other Topics Concern  . Not on file   Social History Narrative  . No narrative on file   Born in [place] Married 43 years in September of this year. 1 child, daughter. 2 grandchildren, both grandsons.  Smokes half a pack a day. Started smoking in her early 20's. Denies problems with alcohol.  Hobbies include reading, sewing, crafting.  FAMILY HISTORY: Family History  Problem Relation Age of Onset  . Cancer Father    Mother & father deceased. Mother was 55; died from natural causes. Father died of colon cancer, was 26-59. 5 sisters. Oldest is diabetic. Baby sister is borderline diabetic. All of them have some form  of heart disease. All on some form of blood pressure medicine. Mother also had high blood pressure; her mother had it too.  ALLERGIES:  is allergic to ace inhibitors and indomethacin.  MEDICATIONS:  Current Outpatient Prescriptions  Medication Sig Dispense Refill  . acetaminophen (TYLENOL) 500 MG tablet Take 1,000 mg by mouth every 6 (six) hours as needed for mild pain or moderate pain.     Marland Kitchen albuterol (PROVENTIL HFA;VENTOLIN HFA) 108 (90 BASE) MCG/ACT inhaler Inhale 1-2 puffs into the lungs every 6 (six) hours as needed for wheezing or shortness of breath. 1 Inhaler 0  . Cetirizine HCl 10 MG CAPS Take 1 capsule (10 mg total) by mouth at bedtime. Restart after you've completed a three-day course of Benadryl.    . Cholecalciferol (VITAMIN D PO) Take  1 tablet by mouth daily. Reported on 03/31/2016    . ferrous gluconate (FERGON) 324 MG tablet Take 1 tablet (324 mg total) by mouth 2 (two) times daily with a meal. 60 tablet 3  . glipiZIDE (GLUCOTROL) 10 MG tablet Take 5 mg by mouth daily after supper.    . lidocaine-prilocaine (EMLA) cream Apply a quarter size amount to port site 1 hour prior to chemo. Do not rub in. Cover with plastic wrap. 30 g 3  . megestrol (MEGACE) 400 MG/10ML suspension Take 10 mLs (400 mg total) by mouth daily. 480 mL 1  . morphine (MS CONTIN) 15 MG 12 hr tablet Take 1 tablet (15 mg total) by mouth every 12 (twelve) hours. 60 tablet 0  . Nivolumab (OPDIVO IV) Inject into the vein. Every 2 weeks    . ondansetron (ZOFRAN) 8 MG tablet Take 1 tablet (8 mg total) by mouth every 8 (eight) hours as needed for nausea or vomiting. 30 tablet 2  . oxymetazoline (NASAL SPRAY 12 HOUR) 0.05 % nasal spray Place 1 spray into both nostrils daily as needed for congestion. Reported on 02/28/2016    . pantoprazole (PROTONIX) 40 MG tablet Take 1 tablet (40 mg total) by mouth 2 (two) times daily before a meal. 60 tablet 3  . predniSONE (DELTASONE) 10 MG tablet Take at the onset of diarrhea.  Take 7  tablets (70 mg) at one time.  Then call the [place]. 35 tablet 0  . prochlorperazine (COMPAZINE) 10 MG tablet Take 1 tablet (10 mg total) by mouth every 6 (six) hours as needed for nausea or vomiting. 30 tablet 2  . sucralfate (CARAFATE) 1 g tablet Take 1 tablet (1 g total) by mouth 4 (four) times daily -  with meals and at bedtime. 120 tablet 0  . triamterene-hydrochlorothiazide (DYAZIDE) 37.5-25 MG per capsule Take 1 each (1 capsule total) by mouth daily.    Marland Kitchen HYDROcodone-acetaminophen (NORCO) 5-325 MG tablet Take 1-2 tablets by mouth every 4 (four) hours as needed for moderate pain. 120 tablet 0  . levofloxacin (LEVAQUIN) 500 MG tablet Take 1 tablet (500 mg total) by mouth daily. 7 tablet 0  . magic mouthwash w/lidocaine SOLN Equal parts of: Benadryl 12.61m/5mL, Viscous lidocaine 2%, and Maalox. Swish and swallow 5 mL four times a day. 360 mL 1  . potassium chloride SA (K-DUR,KLOR-CON) 20 MEQ tablet TAKE 1 TABLET BY MOUTH TWICE DAILY 60 tablet 0   No current facility-administered medications for this visit.     Review of Systems  Constitutional: Negative.        Mouth feels as if she is "sucking on a lemon". Good appetite. Good energy levels.   HENT: Negative.   Eyes: Negative.   Respiratory: Negative.  Negative for hemoptysis and shortness of breath.   Cardiovascular: Negative.   Gastrointestinal: Negative.  Negative for abdominal pain, constipation and diarrhea.  Genitourinary: Negative.   Musculoskeletal: Negative.        Chronic R knee pain.   Skin: Negative.   Neurological: Negative.   Endo/Heme/Allergies: Negative.   Psychiatric/Behavioral: Negative.   All other systems reviewed and are negative. 14 point ROS was done and is otherwise as detailed above or in HPI  PHYSICAL EXAMINATION: ECOG PERFORMANCE STATUS: 1 - Symptomatic but completely ambulatory  Vitals:   08/28/16 1010  BP: 124/61  Pulse: 61  Resp: 16  Temp: 98.4 F (36.9 C)   Filed Weights   08/28/16  1010  Weight: 161 lb 3.2 oz (  73.1 kg)     Physical Exam  Constitutional: She is oriented to person, place, and time and well-developed, well-nourished, and in no distress.  Pt was able to get on exam table without assistance.   HENT:  Head: Normocephalic and atraumatic.  Nose: Nose normal.  Mouth/Throat: Oropharynx is clear and moist. No oropharyngeal exudate.  Eyes: Conjunctivae and EOM are normal. Pupils are equal, round, and reactive to light. Right eye exhibits no discharge. Left eye exhibits no discharge. No scleral icterus.  Neck: Normal range of motion. Neck supple. No tracheal deviation present. No thyromegaly present.  Cardiovascular: Normal rate, regular rhythm, normal heart sounds and intact distal pulses.  Exam reveals no gallop and no friction rub.   No murmur heard. Pulmonary/Chest: Effort normal and breath sounds normal. She has no wheezes. She has no rales.  Abdominal: Soft. Bowel sounds are normal. She exhibits no distension and no mass. There is no tenderness. There is no rebound and no guarding.  Musculoskeletal: Normal range of motion. She exhibits no edema.  Lymphadenopathy:    She has no cervical adenopathy.  Neurological: She is alert and oriented to person, place, and time. She has normal reflexes. No cranial nerve deficit. Gait normal. Coordination normal.  Skin: Skin is warm and dry. No rash noted.  Psychiatric: Mood, memory, affect and judgment normal.  Nursing note and vitals reviewed.   LABORATORY DATA:  I have reviewed the data as listed Results for CLOVER, FEEHAN (MRN [id number])  Ref. Range 08/28/2016 10:33  Sodium Latest Ref Range: 135 - 145 mmol/L 140  Potassium Latest Ref Range: 3.5 - 5.1 mmol/L 3.9  Chloride Latest Ref Range: 101 - 111 mmol/L 106  CO2 Latest Ref Range: 22 - 32 mmol/L 26  Glucose Latest Ref Range: 65 - 99 mg/dL 111 (H)  BUN Latest Ref Range: 6 - 20 mg/dL 19  Creatinine Latest Ref Range: 0.44 - 1.00 mg/dL 1.26 (H)  Calcium  Latest Ref Range: 8.9 - 10.3 mg/dL 9.2  Anion gap Latest Ref Range: 5 - 15  8  Alkaline Phosphatase Latest Ref Range: 38 - 126 U/L 71  Albumin Latest Ref Range: 3.5 - 5.0 g/dL 2.8 (L)  AST Latest Ref Range: 15 - 41 U/L 15  ALT Latest Ref Range: 14 - 54 U/L 11 (L)  Total Protein Latest Ref Range: 6.5 - 8.1 g/dL 6.7  Total Bilirubin Latest Ref Range: 0.3 - 1.2 mg/dL 0.4  EGFR (African American) Latest Ref Range: >60 mL/min 51 (L)  EGFR (Non-African Amer.) Latest Ref Range: >60 mL/min 44 (L)  WBC Latest Ref Range: 4.0 - 10.5 K/uL 7.9  RBC Latest Ref Range: 3.87 - 5.11 MIL/uL 3.79 (L)  Hemoglobin Latest Ref Range: 12.0 - 15.0 g/dL 8.8 (L)  HCT Latest Ref Range: 36.0 - 46.0 % 29.2 (L)  MCV Latest Ref Range: 78.0 - 100.0 fL 77.0 (L)  MCH Latest Ref Range: 26.0 - 34.0 pg 23.2 (L)  MCHC Latest Ref Range: 30.0 - 36.0 g/dL 30.1  RDW Latest Ref Range: 11.5 - 15.5 % 19.6 (H)  Platelets Latest Ref Range: 150 - 400 K/uL 259  Neutrophils Latest Units: % 69  Lymphocytes Latest Units: % 21  Monocytes Relative Latest Units: % 7  Eosinophil Latest Units: % 3  Basophil Latest Units: % 0  NEUT# Latest Ref Range: 1.7 - 7.7 K/uL 5.4  Lymphocyte # Latest Ref Range: 0.7 - 4.0 K/uL 1.7  Monocyte # Latest Ref Range: 0.1 - 1.0 K/uL 0.6  Eosinophils Absolute Latest Ref Range: 0.0 - 0.7 K/uL 0.2  Basophils Absolute Latest Ref Range: 0.0 - 0.1 K/uL 0.0  RBC Morphology Unknown TARGET CELLS    RADIOGRAPHIC STUDIES: I have personally reviewed the radiological images as listed and agreed with the findings in the report. No results found.  Study Result   CLINICAL DATA:  History of left lung Pancoast tumor diagnosed in May of 2017. Restaging post chemotherapy and radiation therapy.  EXAM: CT CHEST WITH CONTRAST  TECHNIQUE: Multidetector CT imaging of the chest was performed during intravenous contrast administration.  CONTRAST:  37m ISOVUE-300 IOPAMIDOL (ISOVUE-300) INJECTION 61%  COMPARISON:   Chest CT 09/19/2013, 02/17/2016 and 06/11/2016. PET-CT 03/12/2016.  FINDINGS: Cardiovascular: No acute vascular findings are seen. There is diffuse atherosclerosis of the aorta, great vessels and coronary arteries. There is a right IJ Port-A-Cath with its tip near the SVC right atrial junction. The heart size is normal. There is no pericardial effusion.  Mediastinum/Nodes: There are no enlarged mediastinal, hilar or axillary lymph nodes. There is stable diffuse thyroid nodularity. The esophagus and trachea demonstrate no significant findings.  Lungs/Pleura: Trace pleural fluid dependently on the left. No significant pleural fluid on the right. No pneumothorax. Large necrotic left apical mass is again noted with associated chest wall invasion. This currently measures approximately 11.5 x 6.8 x 8.6 cm (previously 11.9 x 6.8 x 8.8 cm). Surrounding parenchymal opacity in the left upper lobe appears about the same, although has mildly worsened in the superior segment of the lower lobe. Patchy airspace opacity in the right upper lobe has improved. 3.3 x 2.9 cm right apical lesion appears unchanged. No new pulmonary nodules are seen.  Upper abdomen: The visualized upper abdomen appears stable without suspicious findings. There is no adrenal mass. Bilateral renal cysts are present. Chronic collaterals in the porta hepatis.  Musculoskeletal/Chest wall: Left apical chest wall invasion and partial destruction/pathologic fracture of the left second rib appear unchanged. No new osseous findings seen.  IMPRESSION: 1. No significant change in dominant necrotic left apical mass with chest wall invasion and destruction of the left second rib. 2. Surrounding fluctuating airspace opacities are favored to be secondary to postobstructive pneumonitis and radiation therapy. The distribution, appearance and fluctuation are not typical for lymphangitic spread of tumor. 3. Stable thyroid  nodularity. 4. Diffuse atherosclerosis.   Electronically Signed   By: WRichardean SaleM.D.   On: 07/17/2016 15:11    PATHOLOGY:     ASSESSMENT & PLAN:  LUL pancoast tumor with chest wall invasion Symptomatic Anemia Iron deficiency  Low B12 Cancer related pain Concurrent Chemo/XRT Hyperglycemia Anxiety about health Hypotension due to drug Hemoptysis  Left pancoast tumor with direct invasion into the chest wall (T3N0M0), measuring 9.6 cm in largest dimension with PET scan on 03/12/2016 demonstrating direct chest wall invasion and findings suspicious for lymphangitic spread. Biopsy unable identify phenotype but poorly differentiated. She completed concurrent chemo/xrt, imaging suggests stable disease. She also has a RUL nodule, not biopsied, but certainly suspicious for stage IV disease. She had a sub optimal response to treatment.  She is now on immunotherapy with Nivolumab in the setting of TMB mutation on [facility] testing.  Her anemia is persistent.  No further evidence of iron deficiency. She has completed B12 X 4. Will continue B12 monthly.At some point if she continues to require transfusion, may need to consider BMBX.  Labs were reviewed with the patient today. Results are noted above.   She does not need any refills today.  She will be re-staged in January 2018.   She will return for a follow up in 4 weeks. She will get labs at this time, as well. Her next treatment is on 09/11/2016.   Orders Placed This Encounter  Procedures  . CBC with Differential    Standing Status:   Future    Standing Expiration Date:   08/28/2017  . Comprehensive metabolic panel    Standing Status:   Future    Standing Expiration Date:   08/28/2017  . CBC with Differential    Standing Status:   Future    Number of Occurrences:   1    Standing Expiration Date:   08/28/2017  . Comprehensive metabolic panel    Standing Status:   Future    Number of Occurrences:   1    Standing  Expiration Date:   08/28/2017   Meds ordered this encounter  Medications  . magic mouthwash w/lidocaine SOLN    Sig: Equal parts of: Benadryl 12.51m/5mL, Viscous lidocaine 2%, and Maalox. Swish and swallow 5 mL four times a day.    Dispense:  360 mL    Refill:  1    All questions were answered. The patient knows to call the clinic with any problems, questions or concerns.  This document serves as a record of services personally performed by SAncil Linsey MD. It was created on her behalf by [email or web address], a trained medical scribe. The creation of this record is based on the scribe's personal observations and the provider's statements to them. This document has been checked and approved by the attending provider.  I have reviewed the above documentation for accuracy and completeness and I agree with the above.  This note was electronically signed.    SKelby Fam Enas Winchel, MD  09/26/2016 8:52 PM

## 2016-09-11 ENCOUNTER — Encounter (HOSPITAL_BASED_OUTPATIENT_CLINIC_OR_DEPARTMENT_OTHER): Payer: Medicaid Other

## 2016-09-11 ENCOUNTER — Encounter (HOSPITAL_COMMUNITY): Payer: Self-pay

## 2016-09-11 VITALS — BP 119/54 | HR 68 | Temp 98.6°F | Resp 18 | Wt 159.0 lb

## 2016-09-11 DIAGNOSIS — C3412 Malignant neoplasm of upper lobe, left bronchus or lung: Secondary | ICD-10-CM | POA: Diagnosis not present

## 2016-09-11 DIAGNOSIS — Z5112 Encounter for antineoplastic immunotherapy: Secondary | ICD-10-CM | POA: Diagnosis present

## 2016-09-11 DIAGNOSIS — D649 Anemia, unspecified: Secondary | ICD-10-CM

## 2016-09-11 DIAGNOSIS — R918 Other nonspecific abnormal finding of lung field: Secondary | ICD-10-CM

## 2016-09-11 LAB — COMPREHENSIVE METABOLIC PANEL
ALK PHOS: 85 U/L (ref 38–126)
ALT: 17 U/L (ref 14–54)
AST: 20 U/L (ref 15–41)
Albumin: 2.9 g/dL — ABNORMAL LOW (ref 3.5–5.0)
Anion gap: 7 (ref 5–15)
BILIRUBIN TOTAL: 0.5 mg/dL (ref 0.3–1.2)
BUN: 22 mg/dL — AB (ref 6–20)
CALCIUM: 9.6 mg/dL (ref 8.9–10.3)
CHLORIDE: 106 mmol/L (ref 101–111)
CO2: 23 mmol/L (ref 22–32)
Creatinine, Ser: 1.34 mg/dL — ABNORMAL HIGH (ref 0.44–1.00)
GFR, EST AFRICAN AMERICAN: 48 mL/min — AB (ref >60)
GFR, EST NON AFRICAN AMERICAN: 41 mL/min — AB (ref >60)
Glucose, Bld: 108 mg/dL — ABNORMAL HIGH (ref 65–99)
Potassium: 4.2 mmol/L (ref 3.5–5.1)
Sodium: 136 mmol/L (ref 135–145)
Total Protein: 7.1 g/dL (ref 6.5–8.1)

## 2016-09-11 LAB — CBC WITH DIFFERENTIAL/PLATELET
BASOS PCT: 0 %
Basophils Absolute: 0 10*3/uL (ref 0.0–0.1)
EOS PCT: 2 %
Eosinophils Absolute: 0.2 10*3/uL (ref 0.0–0.7)
HEMATOCRIT: 29.1 % — AB (ref 36.0–46.0)
HEMOGLOBIN: 8.8 g/dL — AB (ref 12.0–15.0)
LYMPHS PCT: 22 %
Lymphs Abs: 1.7 10*3/uL (ref 0.7–4.0)
MCH: 22.4 pg — AB (ref 26.0–34.0)
MCHC: 30.2 g/dL (ref 30.0–36.0)
MCV: 74 fL — AB (ref 78.0–100.0)
MONOS PCT: 7 %
Monocytes Absolute: 0.5 10*3/uL (ref 0.1–1.0)
NEUTROS PCT: 69 %
Neutro Abs: 5.1 10*3/uL (ref 1.7–7.7)
PLATELETS: 311 10*3/uL (ref 150–400)
RBC: 3.93 MIL/uL (ref 3.87–5.11)
RDW: 20 % — ABNORMAL HIGH (ref 11.5–15.5)
WBC: 7.5 10*3/uL (ref 4.0–10.5)

## 2016-09-11 LAB — TSH: TSH: 1.12 u[IU]/mL (ref 0.350–4.500)

## 2016-09-11 MED ORDER — SODIUM CHLORIDE 0.9 % IV SOLN
240.0000 mg | Freq: Once | INTRAVENOUS | Status: AC
  Administered 2016-09-11: 240 mg via INTRAVENOUS
  Filled 2016-09-11: qty 4

## 2016-09-11 MED ORDER — SODIUM CHLORIDE 0.9 % IV SOLN
Freq: Once | INTRAVENOUS | Status: AC
  Administered 2016-09-11: 14:00:00 via INTRAVENOUS

## 2016-09-11 MED ORDER — HEPARIN SOD (PORK) LOCK FLUSH 100 UNIT/ML IV SOLN
500.0000 [IU] | Freq: Once | INTRAVENOUS | Status: AC | PRN
  Administered 2016-09-11: 500 [IU]
  Filled 2016-09-11: qty 5

## 2016-09-11 MED ORDER — HYDROCODONE-ACETAMINOPHEN 5-325 MG PO TABS: 1.0000 | ORAL_TABLET | ORAL | 0 refills | Status: DC | PRN

## 2016-09-11 MED ORDER — SODIUM CHLORIDE 0.9% FLUSH
10.0000 mL | INTRAVENOUS | Status: DC | PRN
  Administered 2016-09-11: 10 mL
  Filled 2016-09-11: qty 10

## 2016-09-11 NOTE — Progress Notes (Signed)
Labs reviewed with Kirby Crigler PA-C , proceed with treatment.  Opdivo given today. Patient tolerated it well, no problems. Vitals stable and discharged from clinic ambulatory with husband.follow up as scheduled.

## 2016-09-11 NOTE — Patient Instructions (Signed)
[facility] Discharge Instructions for Patients Receiving Chemotherapy   Beginning January 23rd 2017 lab work for the Cancer Center will be done in the  Main lab at [facility] on 1st floor. If you have a lab appointment with the Cancer Center please come in thru the  Main Entrance and check in at the main information desk   Today you received the following chemotherapy agents Opdivo  To help prevent nausea and vomiting after your treatment, we encourage you to take your nausea medication   If you develop nausea and vomiting, or diarrhea that is not controlled by your medication, call the clinic.  The clinic phone number is [phone]. Office hours are Monday-Friday 8:30am-5:00pm.  BELOW ARE SYMPTOMS THAT SHOULD BE REPORTED IMMEDIATELY:  *FEVER GREATER THAN 101.0 F  *CHILLS WITH OR WITHOUT FEVER  NAUSEA AND VOMITING THAT IS NOT CONTROLLED WITH YOUR NAUSEA MEDICATION  *UNUSUAL SHORTNESS OF BREATH  *UNUSUAL BRUISING OR BLEEDING  TENDERNESS IN MOUTH AND THROAT WITH OR WITHOUT PRESENCE OF ULCERS  *URINARY PROBLEMS  *BOWEL PROBLEMS  UNUSUAL RASH Items with * indicate a potential emergency and should be followed up as soon as possible. If you have an emergency after office hours please contact your primary care physician or go to the nearest emergency department.  Please call the clinic during office hours if you have any questions or concerns.   You may also contact the Patient Navigator at [phone] should you have any questions or need assistance in obtaining follow up care.      Resources For Cancer Patients and their Caregivers ? American Cancer Society: Can assist with transportation, wigs, general needs, runs Look Good Feel Better.        1-888-227-6333 ? Cancer Care: Provides financial assistance, online support groups, medication/co-pay assistance.  1-800-813-HOPE (4673) ? [facility] Assists [place] cancer  patients and their families through emotional , educational and financial support.  336-427-4357 ? [place] DSS Where to apply for food stamps, Medicaid and utility assistance. 336-342-1394 ? RC[facility]: Transportation to medical appointments. 336-347-2287 ? Social Security Administration: May apply for disability if have a Stage IV cancer. 336-342-7796 1-800-772-1213 ? [place] Aging, Disability and Transit Services: Assists with nutrition, care and transit needs. 336-349-2343          

## 2016-09-14 ENCOUNTER — Encounter (HOSPITAL_COMMUNITY): Payer: Self-pay | Admitting: Hematology & Oncology

## 2016-09-15 ENCOUNTER — Telehealth (HOSPITAL_COMMUNITY): Payer: Self-pay | Admitting: *Deleted

## 2016-09-15 NOTE — Telephone Encounter (Signed)
-----   Message from Patrici Ranks, MD sent at 09/14/2016  6:12 PM EST -----  She should be on B12 monthly. Can we get this restarted? Dr.P

## 2016-09-15 NOTE — Telephone Encounter (Signed)
LMOM that Susan Mahoney would be calling to set up monthly B12 injections.

## 2016-09-16 ENCOUNTER — Other Ambulatory Visit (HOSPITAL_COMMUNITY): Payer: Self-pay | Admitting: *Deleted

## 2016-09-16 DIAGNOSIS — D649 Anemia, unspecified: Secondary | ICD-10-CM

## 2016-09-25 ENCOUNTER — Encounter (HOSPITAL_COMMUNITY): Payer: Medicaid Other | Attending: Hematology & Oncology | Admitting: Hematology & Oncology

## 2016-09-25 ENCOUNTER — Encounter (HOSPITAL_COMMUNITY): Payer: Self-pay | Admitting: Hematology & Oncology

## 2016-09-25 ENCOUNTER — Encounter (HOSPITAL_BASED_OUTPATIENT_CLINIC_OR_DEPARTMENT_OTHER): Payer: Medicaid Other

## 2016-09-25 VITALS — BP 103/62 | HR 62 | Temp 98.7°F | Resp 16

## 2016-09-25 VITALS — BP 89/69 | HR 95 | Temp 98.3°F | Resp 16 | Wt 159.6 lb

## 2016-09-25 DIAGNOSIS — G893 Neoplasm related pain (acute) (chronic): Secondary | ICD-10-CM

## 2016-09-25 DIAGNOSIS — D649 Anemia, unspecified: Secondary | ICD-10-CM

## 2016-09-25 DIAGNOSIS — Z5112 Encounter for antineoplastic immunotherapy: Secondary | ICD-10-CM | POA: Diagnosis not present

## 2016-09-25 DIAGNOSIS — C3412 Malignant neoplasm of upper lobe, left bronchus or lung: Secondary | ICD-10-CM

## 2016-09-25 DIAGNOSIS — Z9889 Other specified postprocedural states: Secondary | ICD-10-CM | POA: Diagnosis not present

## 2016-09-25 DIAGNOSIS — I10 Essential (primary) hypertension: Secondary | ICD-10-CM | POA: Diagnosis not present

## 2016-09-25 DIAGNOSIS — E118 Type 2 diabetes mellitus with unspecified complications: Secondary | ICD-10-CM | POA: Diagnosis not present

## 2016-09-25 DIAGNOSIS — Z90710 Acquired absence of both cervix and uterus: Secondary | ICD-10-CM | POA: Diagnosis not present

## 2016-09-25 DIAGNOSIS — E538 Deficiency of other specified B group vitamins: Secondary | ICD-10-CM | POA: Diagnosis not present

## 2016-09-25 DIAGNOSIS — Z8571 Personal history of Hodgkin lymphoma: Secondary | ICD-10-CM | POA: Diagnosis not present

## 2016-09-25 DIAGNOSIS — R634 Abnormal weight loss: Secondary | ICD-10-CM | POA: Diagnosis not present

## 2016-09-25 DIAGNOSIS — F17210 Nicotine dependence, cigarettes, uncomplicated: Secondary | ICD-10-CM | POA: Diagnosis not present

## 2016-09-25 DIAGNOSIS — R7989 Other specified abnormal findings of blood chemistry: Secondary | ICD-10-CM

## 2016-09-25 LAB — IRON AND TIBC
Iron: 26 ug/dL — ABNORMAL LOW (ref 28–170)
Saturation Ratios: 12 % (ref 10.4–31.8)
TIBC: 217 ug/dL — ABNORMAL LOW (ref 250–450)
UIBC: 191 ug/dL

## 2016-09-25 LAB — COMPREHENSIVE METABOLIC PANEL
ALT: 34 U/L (ref 14–54)
ANION GAP: 8 (ref 5–15)
AST: 31 U/L (ref 15–41)
Albumin: 2.8 g/dL — ABNORMAL LOW (ref 3.5–5.0)
Alkaline Phosphatase: 130 U/L — ABNORMAL HIGH (ref 38–126)
BILIRUBIN TOTAL: 0.3 mg/dL (ref 0.3–1.2)
BUN: 17 mg/dL (ref 6–20)
CO2: 26 mmol/L (ref 22–32)
Calcium: 9.5 mg/dL (ref 8.9–10.3)
Chloride: 105 mmol/L (ref 101–111)
Creatinine, Ser: 1.33 mg/dL — ABNORMAL HIGH (ref 0.44–1.00)
GFR, EST AFRICAN AMERICAN: 48 mL/min — AB (ref >60)
GFR, EST NON AFRICAN AMERICAN: 42 mL/min — AB (ref >60)
Glucose, Bld: 178 mg/dL — ABNORMAL HIGH (ref 65–99)
POTASSIUM: 4.1 mmol/L (ref 3.5–5.1)
Sodium: 139 mmol/L (ref 135–145)
TOTAL PROTEIN: 7.1 g/dL (ref 6.5–8.1)

## 2016-09-25 LAB — CBC WITH DIFFERENTIAL/PLATELET
BASOS ABS: 0 10*3/uL (ref 0.0–0.1)
Basophils Relative: 0 %
EOS PCT: 1 %
Eosinophils Absolute: 0.1 10*3/uL (ref 0.0–0.7)
HEMATOCRIT: 29.5 % — AB (ref 36.0–46.0)
Hemoglobin: 8.7 g/dL — ABNORMAL LOW (ref 12.0–15.0)
LYMPHS ABS: 1.8 10*3/uL (ref 0.7–4.0)
Lymphocytes Relative: 20 %
MCH: 21.8 pg — ABNORMAL LOW (ref 26.0–34.0)
MCHC: 29.5 g/dL — ABNORMAL LOW (ref 30.0–36.0)
MCV: 73.9 fL — AB (ref 78.0–100.0)
MONOS PCT: 6 %
Monocytes Absolute: 0.5 10*3/uL (ref 0.1–1.0)
NEUTROS ABS: 6.4 10*3/uL (ref 1.7–7.7)
Neutrophils Relative %: 73 %
Platelets: 309 10*3/uL (ref 150–400)
RBC: 3.99 MIL/uL (ref 3.87–5.11)
RDW: 19.9 % — AB (ref 11.5–15.5)
WBC: 8.8 10*3/uL (ref 4.0–10.5)

## 2016-09-25 LAB — FERRITIN: FERRITIN: 370 ng/mL — AB (ref 11–307)

## 2016-09-25 MED ORDER — SODIUM CHLORIDE 0.9 % IV SOLN
Freq: Once | INTRAVENOUS | Status: AC
  Administered 2016-09-25: 11:00:00 via INTRAVENOUS

## 2016-09-25 MED ORDER — LEVOFLOXACIN 500 MG PO TABS: 500.0000 mg | ORAL_TABLET | Freq: Every day | ORAL | 0 refills | Status: DC

## 2016-09-25 MED ORDER — HEPARIN SOD (PORK) LOCK FLUSH 100 UNIT/ML IV SOLN
500.0000 [IU] | Freq: Once | INTRAVENOUS | Status: AC | PRN
  Administered 2016-09-25: 500 [IU]
  Filled 2016-09-25: qty 5

## 2016-09-25 MED ORDER — SODIUM CHLORIDE 0.9 % IV SOLN
240.0000 mg | Freq: Once | INTRAVENOUS | Status: AC
  Administered 2016-09-25: 240 mg via INTRAVENOUS
  Filled 2016-09-25: qty 20

## 2016-09-25 MED ORDER — SODIUM CHLORIDE 0.9% FLUSH: 10.0000 mL | INTRAVENOUS | Status: DC | PRN

## 2016-09-25 MED ORDER — CYANOCOBALAMIN 1000 MCG/ML IJ SOLN
1000.0000 ug | Freq: Once | INTRAMUSCULAR | Status: AC
  Administered 2016-09-25: 1000 ug via INTRAMUSCULAR

## 2016-09-25 MED ORDER — CYANOCOBALAMIN 1000 MCG/ML IJ SOLN
INTRAMUSCULAR | Status: AC
  Filled 2016-09-25: qty 1

## 2016-09-25 MED ORDER — LEVOFLOXACIN 500 MG PO TABS: 500.0000 mg | ORAL_TABLET | Freq: Every day | ORAL | 0 refills | Status: AC

## 2016-09-25 NOTE — Patient Instructions (Addendum)
[place] at [facility] Discharge Instructions  RECOMMENDATIONS MADE BY THE CONSULTANT AND ANY TEST RESULTS WILL BE SENT TO YOUR REFERRING PHYSICIAN.  You were seen today by Dr. Youlanda Roys will have CT scan chest, abdomen and pelvis Follow up in 2 weeks with [place], labs and chemo that day also.  Thank you for choosing [place] at [facility] to provide your oncology and hematology care.  To afford each patient quality time with our provider, please arrive at least 15 minutes before your scheduled appointment time.    If you have a lab appointment with the [facility] please come in thru the  Main Entrance and check in at the main information desk  You need to re-schedule your appointment should you arrive 10 or more minutes late.  We strive to give you quality time with our providers, and arriving late affects you and other patients whose appointments are after yours.  Also, if you no show three or more times for appointments you may be dismissed from the clinic at the providers discretion.     Again, thank you for choosing [facility].  Our hope is that these requests will decrease the amount of time that you wait before being seen by our physicians.       _____________________________________________________________  Should you have questions after your visit to [facility], please contact our office at [phone] between the hours of 8:30 a.m. and 4:30 p.m.  Voicemails left after 4:30 p.m. will not be returned until the following business day.  For prescription refill requests, have your pharmacy contact our office.       Resources For Cancer Patients and their Caregivers ? American Cancer Society: Can assist with transportation, wigs, general needs, runs Look Good Feel Better.        323-051-5827 ? Cancer Care: Provides financial assistance, online support groups, medication/co-pay assistance.   1-800-813-HOPE (813)132-1060) ? [facility] Assists [place] cancer patients and their families through emotional , educational and financial support.  907-590-2299 ? [place] DSS Where to apply for food stamps, Medicaid and utility assistance. (610)384-4861 ? RCATS: Transportation to medical appointments. 7155433055 ? Social Security Administration: May apply for disability if have a Stage IV cancer. 828-743-0745 207-245-2768 ? [employer], Disability and Transit Services: Assists with nutrition, care and transit needs. [place] Support Programs: '@10RELATIVEDAYS'$ @ > Cancer Support Group  2nd Tuesday of the month 1pm-2pm, Journey Room  > Creative Journey  3rd Tuesday of the month 1130am-1pm, Journey Room  > Look Good Feel Better  1st Wednesday of the month 10am-12 noon, Journey Room (Call [place] to register [phone])

## 2016-09-25 NOTE — Patient Instructions (Signed)
[facility] Discharge Instructions for Patients Receiving Chemotherapy   Beginning January 23rd 2017 lab work for the [facility] will be done in the  Main lab at [facility] on 1st floor. If you have a lab appointment with the [facility] please come in thru the  Main Entrance and check in at the main information desk   Today you received the following chemotherapy agent: Opdivo and B12   If you develop nausea and vomiting, or diarrhea that is not controlled by your medication, call the clinic.  The clinic phone number is [phone]. Office hours are Monday-Friday 8:30am-5:00pm.  BELOW ARE SYMPTOMS THAT SHOULD BE REPORTED IMMEDIATELY:  *FEVER GREATER THAN 101.0 F  *CHILLS WITH OR WITHOUT FEVER  NAUSEA AND VOMITING THAT IS NOT CONTROLLED WITH YOUR NAUSEA MEDICATION  *UNUSUAL SHORTNESS OF BREATH  *UNUSUAL BRUISING OR BLEEDING  TENDERNESS IN MOUTH AND THROAT WITH OR WITHOUT PRESENCE OF ULCERS  *URINARY PROBLEMS  *BOWEL PROBLEMS  UNUSUAL RASH Items with * indicate a potential emergency and should be followed up as soon as possible. If you have an emergency after office hours please contact your primary care physician or go to the nearest emergency department.  Please call the clinic during office hours if you have any questions or concerns.   You may also contact the Patient Navigator at [phone] should you have any questions or need assistance in obtaining follow up care.      Resources For Cancer Patients and their Caregivers ? American Cancer Society: Can assist with transportation, wigs, general needs, runs Look Good Feel Better.        (938)196-7389 ? Cancer Care: Provides financial assistance, online support groups, medication/co-pay assistance.  1-800-813-HOPE 804-505-6061) ? [place] Assists [place] cancer patients and their families through emotional , educational and financial support.   418-374-9888 ? [place] DSS Where to apply for food stamps, Medicaid and utility assistance. 731 432 8951 ? [facility]: Transportation to medical appointments. (540)344-3431 ? Social Security Administration: May apply for disability if have a Stage IV cancer. 939-287-1981 605-451-7857 ? [employer], Disability and Transit Services: Assists with nutrition, care and transit needs. 6476140278

## 2016-09-25 NOTE — Progress Notes (Signed)
Patient tolerated infusion well.  VSS.   Susan Mahoney presents today for injection per MD orders. B12 1065mg administered IM in right Upper Arm. Administration without incident. Patient tolerated well.  Patient ambulatory and stable upon discharge from the clinic.

## 2016-09-25 NOTE — Progress Notes (Signed)
[facility]  Progress Note  Patient Care Team: Susan Fairy, PA-C as PCP - General (Physician Assistant) Daneil Dolin, MD as Consulting Physician (Gastroenterology)  CHIEF COMPLAINTS:     Pancoast tumor of left lung [facility])   02/16/2016 - 02/20/2016 Hospital Admission    Symptomatic anemia, lung mass      02/17/2016 Imaging    CT chest with 9.6 x 6.1 x 6.6 cm macrolobulated mass in LUL with evidence of direct chest wall invasion, surrounding opacities ? early lymphangitic spread. No mediastinal or hilar LAD. pleural based lesion R apex prob cystic present 09/19/2013      02/18/2016 Procedure    Colonoscopy with diverticulosis, five polyps      02/19/2016 Initial Biopsy    Ct biopsy in IR      02/19/2016 Pathology Results    poorly diff malignancy, unclear phenotype. carcinoma and melanoma are ruled out, calretinin positive staining may be seen in mesotheliomas in addition to other tumors      02/19/2016 Procedure    EGD medium sized hiatal hernia, non obstructing schatzki ring, esophagitis      03/12/2016 PET scan    Hypermetabolic LUL lung mass with direct chest wall invasion. Lower level hypermetabolism within the surrounding ground-glass opacity and septal thickening, most consistent with lympangitic spread. No thoracic adenopathy.       03/20/2016 Procedure    Port placed      03/31/2016 - 05/12/2016 Chemotherapy    Weekly Carboplatin/Paclitaxel with XRT      03/31/2016 - 05/14/2016 Radiation Therapy    XRT in [place] with Dr. Tammi Klippel for 33 fractions over 6.5 weeks.        04/28/2016 Imaging    Chest xray- Enlarging left apical mass no occupying about 50% of the left hemithorax, significantly increased from 02/19/2016. Probable bony destruction involving the left anterior second rib.      05/06/2016 Pathology Results    02/18/14 specimen- PDL1 is low positive with 10% tumor score      05/13/2016 Pathology Results    [facility]- Alterations identified:  CDKN2A/B loss, SLIT2 G715, TP53 P151T.  Additional findings: MS-Stable, tumor mutation burden- intermediate (11Muts/Mb).      06/11/2016 Imaging    CT chest- Increased size of left lung apex mass with persistent invasion into the lateral chest wall. 2. Worsening ground-glass opacity within the left lung, now including most of the left upper lobe and lingula, likely indicating progression of lymphangitic tumor spread. 3. New area of ground-glass opacity in the right upper lobe is also concerning for lymphangitic tumor spread. 4. Slight increase in size of right apical mass.      06/11/2016 Progression    CT scan of chest demonstrates lymphangitic spread.      07/17/2016 Imaging    CT chest- No significant change in dominant necrotic left apical mass with chest wall invasion and destruction of the left second rib. 2. Surrounding fluctuating airspace opacities are favored to be secondary to postobstructive pneumonitis and radiation therapy. The distribution, appearance and fluctuation are not typical for lymphangitic spread of tumor.      07/31/2016 -  Chemotherapy    Opdivo       HISTORY OF PRESENTING ILLNESS:  Susan Mahoney 66 y.o. female is here for follow-up of pancoast tumor of the L lung, completed concurrent chemo/XRT with only stable disease. She has a RUL nodule, not biopsied, but now on opdivo for stage IV disease.   She has completed  concurrent carboplatin/taxol and XRT. She is here for ongoing follow-up and treatment with nivolumab.  Susan Mahoney is accompanied by her husband. She says that mostly she feels good. She stays home a lot because of the cold weather. I have reviewed the labs with the patient.   Her pain is about the same as previously. She starts to feel some pain when it is time to take her next pain pill. No progression. No hemoptysis..   She is eating well some days, and not so well other days. She has been moving around well. If she becomes tired, she  will go and lay down. She does her ADL's on her own. She gets dressed daily.   Denies diarrhea, headaches, or SOB.   She has been congested and producing sputum that is a tanish color. She would like an antibiotic.   MEDICAL HISTORY:  Past Medical History:  Diagnosis Date  . Anemia   . Diabetes mellitus, type 2 ([place])   . Hypertension   . Low vitamin B12 level 02/28/2016  . Obesity   . Pancoast tumor of left lung ([place]) 03/03/2016  . Sinus infection   . Tobacco abuse     SURGICAL HISTORY: Past Surgical History:  Procedure Laterality Date  . ABDOMINAL HYSTERECTOMY    . COLONOSCOPY N/A 02/18/2016   Procedure: COLONOSCOPY;  Surgeon: Daneil Dolin, MD;  Location: [facility] ENDO SUITE;  Service: Endoscopy;  Laterality: N/A;  . ESOPHAGOGASTRODUODENOSCOPY N/A 02/18/2016   Procedure: ESOPHAGOGASTRODUODENOSCOPY (EGD);  Surgeon: Daneil Dolin, MD;  Location: [facility] ENDO SUITE;  Service: Endoscopy;  Laterality: N/A;    SOCIAL HISTORY: Social History   Social History  . Marital status: Married    Spouse name: N/A  . Number of children: N/A  . Years of education: N/A   Occupational History  . Not on file.   Social History Main Topics  . Smoking status: Former Smoker    Packs/day: 0.50    Years: 30.00    Types: Cigarettes    Quit date: 03/13/2016  . Smokeless tobacco: Never Used  . Alcohol use No  . Drug use: No  . Sexual activity: No   Other Topics Concern  . Not on file   Social History Narrative  . No narrative on file   Born in [place] Married 43 years in September of this year. 1 child, daughter. 2 grandchildren, both grandsons.  Smokes half a pack a day. Started smoking in her early 20's. Denies problems with alcohol.  Hobbies include reading, sewing, crafting.  FAMILY HISTORY: Family History  Problem Relation Age of Onset  . Cancer Father    Mother & father deceased. Mother was 17; died from natural causes. Father died of colon cancer, was 66-59. 5 sisters. Oldest  is diabetic. Baby sister is borderline diabetic. All of them have some form of heart disease. All on some form of blood pressure medicine. Mother also had high blood pressure; her mother had it too.  ALLERGIES:  is allergic to ace inhibitors and indomethacin.  MEDICATIONS:  Current Outpatient Prescriptions  Medication Sig Dispense Refill  . acetaminophen (TYLENOL) 500 MG tablet Take 1,000 mg by mouth every 6 (six) hours as needed for mild pain or moderate pain.     Marland Kitchen albuterol (PROVENTIL HFA;VENTOLIN HFA) 108 (90 BASE) MCG/ACT inhaler Inhale 1-2 puffs into the lungs every 6 (six) hours as needed for wheezing or shortness of breath. 1 Inhaler 0  . Cetirizine HCl 10 MG CAPS Take 1 capsule (10  mg total) by mouth at bedtime. Restart after you've completed a three-day course of Benadryl.    . Cholecalciferol (VITAMIN D PO) Take 1 tablet by mouth daily. Reported on 03/31/2016    . ferrous gluconate (FERGON) 324 MG tablet Take 1 tablet (324 mg total) by mouth 2 (two) times daily with a meal. 60 tablet 3  . glipiZIDE (GLUCOTROL) 10 MG tablet Take 5 mg by mouth daily after supper.    Marland Kitchen HYDROcodone-acetaminophen (NORCO) 5-325 MG tablet Take 1-2 tablets by mouth every 4 (four) hours as needed for moderate pain. 120 tablet 0  . lidocaine-prilocaine (EMLA) cream Apply a quarter size amount to port site 1 hour prior to chemo. Do not rub in. Cover with plastic wrap. 30 g 3  . magic mouthwash w/lidocaine SOLN Equal parts of: Benadryl 12.18m/5mL, Viscous lidocaine 2%, and Maalox. Swish and swallow 5 mL four times a day. 360 mL 1  . megestrol (MEGACE) 400 MG/10ML suspension Take 10 mLs (400 mg total) by mouth daily. 480 mL 1  . morphine (MS CONTIN) 15 MG 12 hr tablet Take 1 tablet (15 mg total) by mouth every 12 (twelve) hours. 60 tablet 0  . Nivolumab (OPDIVO IV) Inject into the vein. Every 2 weeks    . ondansetron (ZOFRAN) 8 MG tablet Take 1 tablet (8 mg total) by mouth every 8 (eight) hours as needed for  nausea or vomiting. 30 tablet 2  . oxymetazoline (NASAL SPRAY 12 HOUR) 0.05 % nasal spray Place 1 spray into both nostrils daily as needed for congestion. Reported on 02/28/2016    . pantoprazole (PROTONIX) 40 MG tablet Take 1 tablet (40 mg total) by mouth 2 (two) times daily before a meal. 60 tablet 3  . potassium chloride SA (K-DUR,KLOR-CON) 20 MEQ tablet Take 1 tablet (20 mEq total) by mouth 2 (two) times daily. 60 tablet 0  . predniSONE (DELTASONE) 10 MG tablet Take at the onset of diarrhea.  Take 7 tablets (70 mg) at one time.  Then call the [facility] 35 tablet 0  . prochlorperazine (COMPAZINE) 10 MG tablet Take 1 tablet (10 mg total) by mouth every 6 (six) hours as needed for nausea or vomiting. 30 tablet 2  . sucralfate (CARAFATE) 1 g tablet Take 1 tablet (1 g total) by mouth 4 (four) times daily -  with meals and at bedtime. 120 tablet 0  . triamterene-hydrochlorothiazide (DYAZIDE) 37.5-25 MG per capsule Take 1 each (1 capsule total) by mouth daily.     No current facility-administered medications for this visit.     Review of Systems  Constitutional: Positive for malaise/fatigue.       Appetite changes  HENT: Positive for congestion.   Eyes: Negative.   Respiratory: Positive for sputum production (tanish color). Negative for hemoptysis and shortness of breath.   Cardiovascular: Negative.   Gastrointestinal: Negative.  Negative for diarrhea.  Genitourinary: Negative.   Musculoskeletal: Negative.        Chronic R knee pain.   Skin: Negative.   Neurological: Negative.  Negative for headaches.  Endo/Heme/Allergies: Negative.   Psychiatric/Behavioral: Negative.   All other systems reviewed and are negative. 14 point ROS was done and is otherwise as detailed above or in HPI   PHYSICAL EXAMINATION: ECOG PERFORMANCE STATUS: 1 - Symptomatic but completely ambulatory  Vitals:   09/25/16 0907  BP: (!) 89/69  Pulse: 95  Resp: 16  Temp: 98.3 F (36.8 C)   Filed Weights    09/25/16 0907  Weight:  159 lb 9.6 oz (72.4 kg)     Physical Exam  Constitutional: She is oriented to person, place, and time and well-developed, well-nourished, and in no distress.  Patient was able to get on exam table without assistance.   HENT:  Head: Normocephalic and atraumatic.  Nose: Nose normal.  Mouth/Throat: Oropharynx is clear and moist. No oropharyngeal exudate.  Eyes: Conjunctivae and EOM are normal. Pupils are equal, round, and reactive to light. Right eye exhibits no discharge. Left eye exhibits no discharge. No scleral icterus.  Neck: Normal range of motion. Neck supple. No tracheal deviation present. No thyromegaly present.  Cardiovascular: Normal rate, regular rhythm, normal heart sounds and intact distal pulses.  Exam reveals no gallop and no friction rub.   No murmur heard. Pulmonary/Chest: Effort normal and breath sounds normal. She has no wheezes. She has no rales.  Abdominal: Soft. Bowel sounds are normal. She exhibits no distension and no mass. There is no tenderness. There is no rebound and no guarding.  Musculoskeletal: Normal range of motion. She exhibits no edema.  Lymphadenopathy:    She has no cervical adenopathy.  Neurological: She is alert and oriented to person, place, and time. She has normal reflexes. No cranial nerve deficit. Gait normal. Coordination normal.  Skin: Skin is warm and dry. No rash noted.  Psychiatric: Mood, memory, affect and judgment normal.  Nursing note and vitals reviewed.   LABORATORY DATA:  I have reviewed the data as listed Results for MARGEE, TRENTHAM (MRN [id number]) as of 09/25/2016 09:42  Ref. Range 09/25/2016 09:10  COMPREHENSIVE METABOLIC PANEL Unknown Rpt (A)  Sodium Latest Ref Range: 135 - 145 mmol/L 139  Potassium Latest Ref Range: 3.5 - 5.1 mmol/L 4.1  Chloride Latest Ref Range: 101 - 111 mmol/L 105  CO2 Latest Ref Range: 22 - 32 mmol/L 26  Glucose Latest Ref Range: 65 - 99 mg/dL 178 (H)  BUN Latest Ref Range: 6 - 20  mg/dL 17  Creatinine Latest Ref Range: 0.44 - 1.00 mg/dL 1.33 (H)  Calcium Latest Ref Range: 8.9 - 10.3 mg/dL 9.5  Anion gap Latest Ref Range: 5 - 15  8  Alkaline Phosphatase Latest Ref Range: 38 - 126 U/L 130 (H)  Albumin Latest Ref Range: 3.5 - 5.0 g/dL 2.8 (L)  AST Latest Ref Range: 15 - 41 U/L 31  ALT Latest Ref Range: 14 - 54 U/L 34  Total Protein Latest Ref Range: 6.5 - 8.1 g/dL 7.1  Total Bilirubin Latest Ref Range: 0.3 - 1.2 mg/dL 0.3  EGFR (African American) Latest Ref Range: >60 mL/min 48 (L)  EGFR (Non-African Amer.) Latest Ref Range: >60 mL/min 42 (L)   Results for KENECIA, BARREN (MRN [id number]) as of 10/21/2016 07:13  Ref. Range 09/25/2016 09:10  WBC Latest Ref Range: 4.0 - 10.5 K/uL 8.8  RBC Latest Ref Range: 3.87 - 5.11 MIL/uL 3.99  Hemoglobin Latest Ref Range: 12.0 - 15.0 g/dL 8.7 (L)  HCT Latest Ref Range: 36.0 - 46.0 % 29.5 (L)  MCV Latest Ref Range: 78.0 - 100.0 fL 73.9 (L)  MCH Latest Ref Range: 26.0 - 34.0 pg 21.8 (L)  MCHC Latest Ref Range: 30.0 - 36.0 g/dL 29.5 (L)  RDW Latest Ref Range: 11.5 - 15.5 % 19.9 (H)  Platelets Latest Ref Range: 150 - 400 K/uL 309  Neutrophils Latest Units: % 73  Lymphocytes Latest Units: % 20  Monocytes Relative Latest Units: % 6  Eosinophil Latest Units: % 1  Basophil Latest Units: %  0  NEUT# Latest Ref Range: 1.7 - 7.7 K/uL 6.4  Lymphocyte # Latest Ref Range: 0.7 - 4.0 K/uL 1.8  Monocyte # Latest Ref Range: 0.1 - 1.0 K/uL 0.5  Eosinophils Absolute Latest Ref Range: 0.0 - 0.7 K/uL 0.1  Basophils Absolute Latest Ref Range: 0.0 - 0.1 K/uL 0.0  RBC Morphology Unknown TARGET CELLS  Glucose Latest Ref Range: 65 - 99 mg/dL 178 (H)   RADIOGRAPHIC STUDIES: I have personally reviewed the radiological images as listed and agreed with the findings in the report. No results found. Study Result   CLINICAL DATA:  History of left lung Pancoast tumor diagnosed in May of 2017. Restaging post chemotherapy and radiation  therapy.  EXAM: CT CHEST WITH CONTRAST  TECHNIQUE: Multidetector CT imaging of the chest was performed during intravenous contrast administration.  CONTRAST:  68m ISOVUE-300 IOPAMIDOL (ISOVUE-300) INJECTION 61%  COMPARISON:  Chest CT 09/19/2013, 02/17/2016 and 06/11/2016. PET-CT 03/12/2016.  FINDINGS: Cardiovascular: No acute vascular findings are seen. There is diffuse atherosclerosis of the aorta, great vessels and coronary arteries. There is a right IJ Port-A-Cath with its tip near the SVC right atrial junction. The heart size is normal. There is no pericardial effusion.  Mediastinum/Nodes: There are no enlarged mediastinal, hilar or axillary lymph nodes. There is stable diffuse thyroid nodularity. The esophagus and trachea demonstrate no significant findings.  Lungs/Pleura: Trace pleural fluid dependently on the left. No significant pleural fluid on the right. No pneumothorax. Large necrotic left apical mass is again noted with associated chest wall invasion. This currently measures approximately 11.5 x 6.8 x 8.6 cm (previously 11.9 x 6.8 x 8.8 cm). Surrounding parenchymal opacity in the left upper lobe appears about the same, although has mildly worsened in the superior segment of the lower lobe. Patchy airspace opacity in the right upper lobe has improved. 3.3 x 2.9 cm right apical lesion appears unchanged. No new pulmonary nodules are seen.  Upper abdomen: The visualized upper abdomen appears stable without suspicious findings. There is no adrenal mass. Bilateral renal cysts are present. Chronic collaterals in the porta hepatis.  Musculoskeletal/Chest wall: Left apical chest wall invasion and partial destruction/pathologic fracture of the left second rib appear unchanged. No new osseous findings seen.  IMPRESSION: 1. No significant change in dominant necrotic left apical mass with chest wall invasion and destruction of the left second rib. 2.  Surrounding fluctuating airspace opacities are favored to be secondary to postobstructive pneumonitis and radiation therapy. The distribution, appearance and fluctuation are not typical for lymphangitic spread of tumor. 3. Stable thyroid nodularity. 4. Diffuse atherosclerosis.   Electronically Signed   By: WRichardean SaleM.D.   On: 07/17/2016 15:11    PATHOLOGY:       ASSESSMENT & PLAN:  LUL pancoast tumor with chest wall invasion Symptomatic Anemia Colonoscopy/EGD with Dr. RGala Romney5/2017 Iron deficiency  Low B12 Cancer related pain Concurrent Chemo/XRT Hyperglycemia Anxiety about health Hypotension due to drug Hemoptysis Weight loss Cancer cachexia   Stage IIB poorly differentiated left pancoast tumor with direct invasion into the chest wall (T3N0M0), measuring 9.6 cm in largest dimension with PET scan on 03/12/2016 demonstrating direct chest wall invasion and findings suspicious for lymphangitic spread. Biopsy unable identify phenotype but poorly differentiated. She completed concurrent chemo/xrt. Repeat imaging showed unfortunately little change in size of disease with therapy. Given RUL nodule, I suspect stage IV disease.   She continues on nivolumab She is overall doing well. No further hemoptysis.. I would certainly continue with this. She  is due for repeat staging.  Her anemia is persistent to worse.  No further evidence of iron deficiency. She has completed B12 X 4. Will continue B12 monthly.At some point if she continues to require transfusion, may need to consider BMBX.  She does not need any refills. She will need a refill of her pain medication at her next visit.   Prescription for an antibiotic.   She will return on 10/26/16  Meds ordered this encounter  Medications  . DISCONTD: levofloxacin (LEVAQUIN) 500 MG tablet    Sig: Take 1 tablet (500 mg total) by mouth daily.    Dispense:  7 tablet    Refill:  0  . levofloxacin (LEVAQUIN) 500 MG tablet     Sig: Take 1 tablet (500 mg total) by mouth daily.    Dispense:  7 tablet    Refill:  0    Orders Placed This Encounter  Procedures  . CT Chest W Contrast    Standing Status:   Future    Standing Expiration Date:   09/25/2017    Order Specific Question:   If indicated for the ordered procedure, I authorize the administration of contrast media per Radiology protocol    Answer:   Yes    Order Specific Question:   Reason for Exam (SYMPTOM  OR DIAGNOSIS REQUIRED)    Answer:   restaging NSCL    Order Specific Question:   Preferred imaging location?    Answer:   [facility]  . CT Abdomen Pelvis W Contrast    Standing Status:   Future    Standing Expiration Date:   09/25/2017    Order Specific Question:   If indicated for the ordered procedure, I authorize the administration of contrast media per Radiology protocol    Answer:   Yes    Order Specific Question:   Reason for Exam (SYMPTOM  OR DIAGNOSIS REQUIRED)    Answer:   restaging NSCLC    Order Specific Question:   Preferred imaging location?    Answer:   [facility]    All questions were answered. The patient knows to call the clinic with any problems, questions or concerns.  This document serves as a record of services personally performed by Ancil Linsey, MD. It was created on her behalf by Martinique Casey, a trained medical scribe. The creation of this record is based on the scribe's personal observations and the provider's statements to them. This document has been checked and approved by the attending provider.  I have reviewed the above documentation for accuracy and completeness and I agree with the above.  This note was electronically signed.    Molli Hazard, MD  10/21/2016 7:07 AM

## 2016-09-26 ENCOUNTER — Encounter (HOSPITAL_COMMUNITY): Payer: Self-pay | Admitting: Hematology & Oncology

## 2016-10-02 ENCOUNTER — Telehealth (HOSPITAL_COMMUNITY): Payer: Self-pay | Admitting: Oncology

## 2016-10-02 NOTE — Telephone Encounter (Signed)
Peer to peer completed for CT CAP.  Pelvis was denied initially, but reviewer is updated on the patient's recent complaint of right hip pain.  Pelvis is now approved.  Approval #: [id number].  Robynn Pane, PA-C 10/02/2016 3:19 PM

## 2016-10-07 ENCOUNTER — Ambulatory Visit (HOSPITAL_COMMUNITY): Payer: Medicaid Other

## 2016-10-09 ENCOUNTER — Ambulatory Visit (HOSPITAL_COMMUNITY): Payer: Medicaid Other

## 2016-10-09 ENCOUNTER — Other Ambulatory Visit (HOSPITAL_COMMUNITY): Payer: Self-pay | Admitting: Oncology

## 2016-10-09 ENCOUNTER — Ambulatory Visit (HOSPITAL_COMMUNITY): Payer: Medicaid Other | Admitting: Oncology

## 2016-10-09 DIAGNOSIS — C3412 Malignant neoplasm of upper lobe, left bronchus or lung: Secondary | ICD-10-CM

## 2016-10-09 DIAGNOSIS — D649 Anemia, unspecified: Secondary | ICD-10-CM

## 2016-10-09 DIAGNOSIS — R918 Other nonspecific abnormal finding of lung field: Secondary | ICD-10-CM

## 2016-10-09 MED ORDER — HYDROCODONE-ACETAMINOPHEN 5-325 MG PO TABS: 1.0000 | ORAL_TABLET | ORAL | 0 refills | Status: DC | PRN

## 2016-10-13 ENCOUNTER — Encounter (HOSPITAL_BASED_OUTPATIENT_CLINIC_OR_DEPARTMENT_OTHER): Payer: Medicaid Other

## 2016-10-13 ENCOUNTER — Other Ambulatory Visit (HOSPITAL_COMMUNITY): Payer: Self-pay | Admitting: *Deleted

## 2016-10-13 ENCOUNTER — Encounter (HOSPITAL_COMMUNITY): Payer: Self-pay

## 2016-10-13 ENCOUNTER — Encounter (HOSPITAL_COMMUNITY): Payer: Medicaid Other

## 2016-10-13 VITALS — BP 141/80 | HR 80 | Temp 98.6°F | Resp 16 | Wt 155.0 lb

## 2016-10-13 DIAGNOSIS — D649 Anemia, unspecified: Secondary | ICD-10-CM

## 2016-10-13 DIAGNOSIS — C3412 Malignant neoplasm of upper lobe, left bronchus or lung: Secondary | ICD-10-CM

## 2016-10-13 DIAGNOSIS — E876 Hypokalemia: Secondary | ICD-10-CM

## 2016-10-13 DIAGNOSIS — Z5112 Encounter for antineoplastic immunotherapy: Secondary | ICD-10-CM | POA: Diagnosis not present

## 2016-10-13 LAB — COMPREHENSIVE METABOLIC PANEL
ALBUMIN: 3 g/dL — AB (ref 3.5–5.0)
ALK PHOS: 90 U/L (ref 38–126)
ALT: 11 U/L — AB (ref 14–54)
AST: 20 U/L (ref 15–41)
Anion gap: 10 (ref 5–15)
BUN: 15 mg/dL (ref 6–20)
CO2: 26 mmol/L (ref 22–32)
Calcium: 9.5 mg/dL (ref 8.9–10.3)
Chloride: 102 mmol/L (ref 101–111)
Creatinine, Ser: 1.11 mg/dL — ABNORMAL HIGH (ref 0.44–1.00)
GFR calc Af Amer: 60 mL/min — ABNORMAL LOW (ref >60)
GFR calc non Af Amer: 52 mL/min — ABNORMAL LOW (ref >60)
GLUCOSE: 172 mg/dL — AB (ref 65–99)
Potassium: 3.6 mmol/L (ref 3.5–5.1)
SODIUM: 138 mmol/L (ref 135–145)
Total Bilirubin: 0.5 mg/dL (ref 0.3–1.2)
Total Protein: 7.6 g/dL (ref 6.5–8.1)

## 2016-10-13 LAB — CBC WITH DIFFERENTIAL/PLATELET
BASOS PCT: 0 %
Basophils Absolute: 0 10*3/uL (ref 0.0–0.1)
Eosinophils Absolute: 0.1 10*3/uL (ref 0.0–0.7)
Eosinophils Relative: 1 %
HEMATOCRIT: 29.4 % — AB (ref 36.0–46.0)
Hemoglobin: 8.6 g/dL — ABNORMAL LOW (ref 12.0–15.0)
LYMPHS ABS: 1.4 10*3/uL (ref 0.7–4.0)
Lymphocytes Relative: 14 %
MCH: 20.6 pg — ABNORMAL LOW (ref 26.0–34.0)
MCHC: 29.3 g/dL — AB (ref 30.0–36.0)
MCV: 70.5 fL — ABNORMAL LOW (ref 78.0–100.0)
MONOS PCT: 6 %
Monocytes Absolute: 0.6 10*3/uL (ref 0.1–1.0)
NEUTROS ABS: 7.5 10*3/uL (ref 1.7–7.7)
Neutrophils Relative %: 79 %
PLATELETS: 389 10*3/uL (ref 150–400)
RBC: 4.17 MIL/uL (ref 3.87–5.11)
RDW: 20.2 % — ABNORMAL HIGH (ref 11.5–15.5)
WBC: 9.5 10*3/uL (ref 4.0–10.5)

## 2016-10-13 LAB — SAMPLE TO BLOOD BANK

## 2016-10-13 MED ORDER — DIPHENHYDRAMINE HCL 50 MG/ML IJ SOLN: 25.0000 mg | Freq: Once | INTRAMUSCULAR | Status: DC

## 2016-10-13 MED ORDER — SODIUM CHLORIDE 0.9% FLUSH
10.0000 mL | INTRAVENOUS | Status: DC | PRN
  Administered 2016-10-13: 10 mL
  Filled 2016-10-13: qty 10

## 2016-10-13 MED ORDER — SODIUM CHLORIDE 0.9 % IV SOLN
Freq: Once | INTRAVENOUS | Status: AC
  Administered 2016-10-13: 13:00:00 via INTRAVENOUS

## 2016-10-13 MED ORDER — HEPARIN SOD (PORK) LOCK FLUSH 100 UNIT/ML IV SOLN
500.0000 [IU] | Freq: Once | INTRAVENOUS | Status: AC | PRN
  Administered 2016-10-13: 500 [IU]
  Filled 2016-10-13: qty 5

## 2016-10-13 MED ORDER — SODIUM CHLORIDE 0.9 % IV SOLN
240.0000 mg | Freq: Once | INTRAVENOUS | Status: AC
  Administered 2016-10-13: 240 mg via INTRAVENOUS
  Filled 2016-10-13: qty 20

## 2016-10-13 MED ORDER — POTASSIUM CHLORIDE CRYS ER 20 MEQ PO TBCR: 20.0000 meq | EXTENDED_RELEASE_TABLET | Freq: Two times a day (BID) | ORAL | 0 refills | Status: AC

## 2016-10-13 NOTE — Progress Notes (Signed)
Chemotherapy given today per orders. Patient tolerated it well. Refilled potassium, gave her the pain script.Hemoglobin 8.6 , Tom Kefalas PA-C notified and will draw labs next week to check CBC. Vitals stable and discharged ambulatory from clinic. Follow up as scheduled.

## 2016-10-13 NOTE — Patient Instructions (Signed)
[facility] Discharge Instructions for Patients Receiving Chemotherapy   Beginning January 23rd 2017 lab work for the Cancer Center will be done in the  Main lab at [facility] on 1st floor. If you have a lab appointment with the Cancer Center please come in thru the  Main Entrance and check in at the main information desk   Today you received the following chemotherapy agents   To help prevent nausea and vomiting after your treatment, we encourage you to take your nausea medication     If you develop nausea and vomiting, or diarrhea that is not controlled by your medication, call the clinic.  The clinic phone number is [phone]. Office hours are Monday-Friday 8:30am-5:00pm.  BELOW ARE SYMPTOMS THAT SHOULD BE REPORTED IMMEDIATELY:  *FEVER GREATER THAN 101.0 F  *CHILLS WITH OR WITHOUT FEVER  NAUSEA AND VOMITING THAT IS NOT CONTROLLED WITH YOUR NAUSEA MEDICATION  *UNUSUAL SHORTNESS OF BREATH  *UNUSUAL BRUISING OR BLEEDING  TENDERNESS IN MOUTH AND THROAT WITH OR WITHOUT PRESENCE OF ULCERS  *URINARY PROBLEMS  *BOWEL PROBLEMS  UNUSUAL RASH Items with * indicate a potential emergency and should be followed up as soon as possible. If you have an emergency after office hours please contact your primary care physician or go to the nearest emergency department.  Please call the clinic during office hours if you have any questions or concerns.   You may also contact the Patient Navigator at [phone] should you have any questions or need assistance in obtaining follow up care.      Resources For Cancer Patients and their Caregivers ? American Cancer Society: Can assist with transportation, wigs, general needs, runs Look Good Feel Better.        1-888-227-6333 ? Cancer Care: Provides financial assistance, online support groups, medication/co-pay assistance.  1-800-813-HOPE (4673) ? [facility] Assists [place] cancer  patients and their families through emotional , educational and financial support.  336-427-4357 ? [place] DSS Where to apply for food stamps, Medicaid and utility assistance. 336-342-1394 ? [facility]: Transportation to medical appointments. 336-347-2287 ? Social Security Administration: May apply for disability if have a Stage IV cancer. 336-342-7796 1-800-772-1213 ? [place] Aging, Disability and Transit Services: Assists with nutrition, care and transit needs. 336-349-2343         

## 2016-10-21 ENCOUNTER — Encounter (HOSPITAL_COMMUNITY): Payer: Self-pay | Admitting: Hematology & Oncology

## 2016-10-22 ENCOUNTER — Ambulatory Visit (HOSPITAL_COMMUNITY)
Admission: RE | Admit: 2016-10-22 | Discharge: 2016-10-22 | Disposition: A | Discharge status: Home / Self Care | Payer: Medicaid Other | Source: Ambulatory Visit | Attending: Hematology & Oncology | Admitting: Hematology & Oncology

## 2016-10-22 DIAGNOSIS — C3412 Malignant neoplasm of upper lobe, left bronchus or lung: Secondary | ICD-10-CM | POA: Insufficient documentation

## 2016-10-22 DIAGNOSIS — M8448XS Pathological fracture, other site, sequela: Secondary | ICD-10-CM | POA: Insufficient documentation

## 2016-10-22 DIAGNOSIS — E042 Nontoxic multinodular goiter: Secondary | ICD-10-CM | POA: Insufficient documentation

## 2016-10-22 DIAGNOSIS — I700 Atherosclerosis of aorta: Secondary | ICD-10-CM | POA: Insufficient documentation

## 2016-10-22 DIAGNOSIS — K5750 Diverticulosis of both small and large intestine without perforation or abscess without bleeding: Secondary | ICD-10-CM | POA: Insufficient documentation

## 2016-10-22 DIAGNOSIS — R918 Other nonspecific abnormal finding of lung field: Secondary | ICD-10-CM | POA: Insufficient documentation

## 2016-10-22 DIAGNOSIS — J90 Pleural effusion, not elsewhere classified: Secondary | ICD-10-CM | POA: Insufficient documentation

## 2016-10-22 MED ORDER — IOPAMIDOL (ISOVUE-300) INJECTION 61%
100.0000 mL | Freq: Once | INTRAVENOUS | Status: AC | PRN
  Administered 2016-10-22: 100 mL via INTRAVENOUS

## 2016-10-26 ENCOUNTER — Encounter (HOSPITAL_COMMUNITY): Payer: Medicaid Other | Attending: Hematology & Oncology

## 2016-10-26 ENCOUNTER — Encounter (HOSPITAL_BASED_OUTPATIENT_CLINIC_OR_DEPARTMENT_OTHER): Payer: Medicaid Other | Admitting: Oncology

## 2016-10-26 ENCOUNTER — Encounter (HOSPITAL_COMMUNITY): Payer: Self-pay | Admitting: Oncology

## 2016-10-26 VITALS — BP 118/87 | HR 96 | Temp 99.3°F | Resp 18 | Wt 150.0 lb

## 2016-10-26 DIAGNOSIS — Z90710 Acquired absence of both cervix and uterus: Secondary | ICD-10-CM | POA: Insufficient documentation

## 2016-10-26 DIAGNOSIS — Z8571 Personal history of Hodgkin lymphoma: Secondary | ICD-10-CM | POA: Insufficient documentation

## 2016-10-26 DIAGNOSIS — C3412 Malignant neoplasm of upper lobe, left bronchus or lung: Secondary | ICD-10-CM | POA: Diagnosis not present

## 2016-10-26 DIAGNOSIS — E538 Deficiency of other specified B group vitamins: Secondary | ICD-10-CM

## 2016-10-26 DIAGNOSIS — E118 Type 2 diabetes mellitus with unspecified complications: Secondary | ICD-10-CM | POA: Insufficient documentation

## 2016-10-26 DIAGNOSIS — I10 Essential (primary) hypertension: Secondary | ICD-10-CM | POA: Insufficient documentation

## 2016-10-26 DIAGNOSIS — Z9889 Other specified postprocedural states: Secondary | ICD-10-CM | POA: Insufficient documentation

## 2016-10-26 DIAGNOSIS — F17210 Nicotine dependence, cigarettes, uncomplicated: Secondary | ICD-10-CM | POA: Insufficient documentation

## 2016-10-26 DIAGNOSIS — E876 Hypokalemia: Secondary | ICD-10-CM

## 2016-10-26 LAB — CBC WITH DIFFERENTIAL/PLATELET
Basophils Absolute: 0 10*3/uL (ref 0.0–0.1)
Basophils Relative: 0 %
EOS ABS: 0.2 10*3/uL (ref 0.0–0.7)
Eosinophils Relative: 2 %
HCT: 30.1 % — ABNORMAL LOW (ref 36.0–46.0)
Hemoglobin: 8.8 g/dL — ABNORMAL LOW (ref 12.0–15.0)
Lymphocytes Relative: 25 %
Lymphs Abs: 1.9 10*3/uL (ref 0.7–4.0)
MCH: 20.6 pg — ABNORMAL LOW (ref 26.0–34.0)
MCHC: 29.2 g/dL — AB (ref 30.0–36.0)
MCV: 70.3 fL — AB (ref 78.0–100.0)
MONO ABS: 0.6 10*3/uL (ref 0.1–1.0)
Monocytes Relative: 8 %
NEUTROS PCT: 65 %
Neutro Abs: 4.8 10*3/uL (ref 1.7–7.7)
PLATELETS: 323 10*3/uL (ref 150–400)
RBC: 4.28 MIL/uL (ref 3.87–5.11)
RDW: 21 % — AB (ref 11.5–15.5)
WBC: 7.5 10*3/uL (ref 4.0–10.5)

## 2016-10-26 LAB — COMPREHENSIVE METABOLIC PANEL
ALT: 17 U/L (ref 14–54)
ANION GAP: 11 (ref 5–15)
AST: 18 U/L (ref 15–41)
Albumin: 3 g/dL — ABNORMAL LOW (ref 3.5–5.0)
Alkaline Phosphatase: 111 U/L (ref 38–126)
BUN: 15 mg/dL (ref 6–20)
CHLORIDE: 98 mmol/L — AB (ref 101–111)
CO2: 28 mmol/L (ref 22–32)
Calcium: 9.3 mg/dL (ref 8.9–10.3)
Creatinine, Ser: 1.03 mg/dL — ABNORMAL HIGH (ref 0.44–1.00)
GFR calc Af Amer: >60 mL/min (ref >60)
GFR, EST NON AFRICAN AMERICAN: 57 mL/min — AB (ref >60)
Glucose, Bld: 101 mg/dL — ABNORMAL HIGH (ref 65–99)
POTASSIUM: 3.3 mmol/L — AB (ref 3.5–5.1)
Sodium: 137 mmol/L (ref 135–145)
Total Bilirubin: 0.4 mg/dL (ref 0.3–1.2)
Total Protein: 7.3 g/dL (ref 6.5–8.1)

## 2016-10-26 LAB — TSH: TSH: 1.358 u[IU]/mL (ref 0.350–4.500)

## 2016-10-26 MED ORDER — CYANOCOBALAMIN 1000 MCG/ML IJ SOLN
1000.0000 ug | Freq: Once | INTRAMUSCULAR | Status: AC
  Administered 2016-10-26: 1000 ug via INTRAMUSCULAR

## 2016-10-26 MED ORDER — HEPARIN SOD (PORK) LOCK FLUSH 100 UNIT/ML IV SOLN
INTRAVENOUS | Status: AC
  Filled 2016-10-26: qty 5

## 2016-10-26 MED ORDER — SODIUM CHLORIDE 0.9% FLUSH
20.0000 mL | INTRAVENOUS | Status: DC | PRN
  Administered 2016-10-26: 20 mL via INTRAVENOUS
  Filled 2016-10-26: qty 20

## 2016-10-26 MED ORDER — CYANOCOBALAMIN 1000 MCG/ML IJ SOLN
INTRAMUSCULAR | Status: AC
  Filled 2016-10-26: qty 1

## 2016-10-26 MED ORDER — HEPARIN SOD (PORK) LOCK FLUSH 100 UNIT/ML IV SOLN
500.0000 [IU] | Freq: Once | INTRAVENOUS | Status: AC
  Administered 2016-10-26: 500 [IU] via INTRAVENOUS

## 2016-10-26 NOTE — Patient Instructions (Signed)
[place] at [facility] Discharge Instructions  RECOMMENDATIONS MADE BY THE CONSULTANT AND ANY TEST RESULTS WILL BE SENT TO YOUR REFERRING PHYSICIAN.  B12 today. Port accessed and labs drawn for treatment tomorrow.    Thank you for choosing [place] at [facility] to provide your oncology and hematology care.  To afford each patient quality time with our provider, please arrive at least 15 minutes before your scheduled appointment time.    If you have a lab appointment with the [facility] please come in thru the  Main Entrance and check in at the main information desk  You need to re-schedule your appointment should you arrive 10 or more minutes late.  We strive to give you quality time with our providers, and arriving late affects you and other patients whose appointments are after yours.  Also, if you no show three or more times for appointments you may be dismissed from the clinic at the providers discretion.     Again, thank you for choosing [facility].  Our hope is that these requests will decrease the amount of time that you wait before being seen by our physicians.       _____________________________________________________________  Should you have questions after your visit to [facility], please contact our office at [phone] between the hours of 8:30 a.m. and 4:30 p.m.  Voicemails left after 4:30 p.m. will not be returned until the following business day.  For prescription refill requests, have your pharmacy contact our office.       Resources For Cancer Patients and their Caregivers ? American Cancer Society: Can assist with transportation, wigs, general needs, runs Look Good Feel Better.        3201467652 ? Cancer Care: Provides financial assistance, online support groups, medication/co-pay assistance.  1-800-813-HOPE 848-215-4203) ? [facility] Assists [place]  cancer patients and their families through emotional , educational and financial support.  (315)331-3172 ? [place] DSS Where to apply for food stamps, Medicaid and utility assistance. 4245810876 ? [facility]: Transportation to medical appointments. 938-651-0151 ? Social Security Administration: May apply for disability if have a Stage IV cancer. 820-657-6091 (724)144-4837 ? [employer], Disability and Transit Services: Assists with nutrition, care and transit needs. [place] Support Programs: '@10RELATIVEDAYS'$ @ > Cancer Support Group  2nd Tuesday of the month 1pm-2pm, Journey Room  > Creative Journey  3rd Tuesday of the month 1130am-1pm, Journey Room  > Look Good Feel Better  1st Wednesday of the month 10am-12 noon, Journey Room (Call [place] to register [phone])

## 2016-10-26 NOTE — Assessment & Plan Note (Addendum)
Pancoast tumor of the L lung, completed concurrent chemo/XRT with only stable disease. She has a RUL nodule, not biopsied, but now on opdivo for stage IV disease beginning on 07/31/2016.  Oncology history is updated.  Pre-treatment labs: CBC diff, CMET, TSH.  I personally reviewed and went over laboratory results with the patient.  The results are noted within this dictation.  Labs satisfy treatment parameters.  Anemia is noted and stable. Mild hypokalemia is appreciated as well.  She'll return tomorrow for therapy.  Orders placed for 40 mEq of oral potassium tomorrow in the clinic.  I personally reviewed and went over radiographic studies with the patient.  The results are noted within this dictation.  Overall CT imaging is stable to improved.  Will continue with treatment as planned.  She reports vaginal and rectal she right [place] without rash or discharge. She declined examination by me today. I will refer her to gynecology for further evaluation and management.  Return in 2 weeks for ongoing treatment and 4 weeks for follow-up with treatment.

## 2016-10-26 NOTE — Assessment & Plan Note (Signed)
Low vitamin B12.  On monthly B12 injections.  B12 injection today.

## 2016-10-26 NOTE — Progress Notes (Signed)
Susan Mahoney presents today for injection per MD orders. B12 1019mg administered IM in left Upper Arm. Administration without incident. Patient tolerated well. Portacath located right chest wall accessed with  H 20 needle. Good blood return present.  Specimen drawn for labs for treatment tomorrow.   Portacath flushed with 233mNS and 500U/69m44meparin and needle left intact for treatment scheduled tomorrow.   Procedure without incident. Patient tolerated procedure well.

## 2016-10-26 NOTE — Progress Notes (Addendum)
Susan Fairy, PA-C [address] [place] [address]  Pancoast tumor of left lung ([place]) - Plan: CBC with Differential, Comprehensive metabolic panel, TSH  Low vitamin B12 level  Hypokalemia - Plan: potassium chloride SA (K-DUR,KLOR-CON) CR tablet 40 mEq  CURRENT THERAPY: Nivolumab beginning on 07/31/2016  INTERVAL HISTORY: Susan Mahoney 66 y.o. female returns for followup of pancoast tumor of the L lung, completed concurrent chemo/XRT with only stable disease. She has a RUL nodule, not biopsied, but now on opdivo for stage IV disease.     Pancoast tumor of left lung ([place])   02/16/2016 - 02/20/2016 Hospital Admission    Symptomatic anemia, lung mass      02/17/2016 Imaging    CT chest with 9.6 x 6.1 x 6.6 cm macrolobulated mass in LUL with evidence of direct chest wall invasion, surrounding opacities ? early lymphangitic spread. No mediastinal or hilar LAD. pleural based lesion R apex prob cystic present 09/19/2013      02/18/2016 Procedure    Colonoscopy with diverticulosis, five polyps      02/19/2016 Initial Biopsy    Ct biopsy in IR      02/19/2016 Pathology Results    poorly diff malignancy, unclear phenotype. carcinoma and melanoma are ruled out, calretinin positive staining may be seen in mesotheliomas in addition to other tumors      02/19/2016 Procedure    EGD medium sized hiatal hernia, non obstructing schatzki ring, esophagitis      03/12/2016 PET scan    Hypermetabolic LUL lung mass with direct chest wall invasion. Lower level hypermetabolism within the surrounding ground-glass opacity and septal thickening, most consistent with lympangitic spread. No thoracic adenopathy.       03/20/2016 Procedure    Port placed      03/31/2016 - 05/12/2016 Chemotherapy    Weekly Carboplatin/Paclitaxel with XRT      03/31/2016 - 05/14/2016 Radiation Therapy    XRT in [place] with Dr. Tammi Klippel for 33 fractions over 6.5 weeks.        04/28/2016 Imaging   Chest xray- Enlarging left apical mass no occupying about 50% of the left hemithorax, significantly increased from 02/19/2016. Probable bony destruction involving the left anterior second rib.      05/06/2016 Pathology Results    02/18/14 specimen- PDL1 is low positive with 10% tumor score      05/13/2016 Pathology Results    [facility]- Alterations identified: CDKN2A/B loss, SLIT2 G715, TP53 P151T.  Additional findings: MS-Stable, tumor mutation burden- intermediate (11Muts/Mb).      06/11/2016 Imaging    CT chest- Increased size of left lung apex mass with persistent invasion into the lateral chest wall. 2. Worsening ground-glass opacity within the left lung, now including most of the left upper lobe and lingula, likely indicating progression of lymphangitic tumor spread. 3. New area of ground-glass opacity in the right upper lobe is also concerning for lymphangitic tumor spread. 4. Slight increase in size of right apical mass.      06/11/2016 Progression    CT scan of chest demonstrates lymphangitic spread.      07/17/2016 Imaging    CT chest- No significant change in dominant necrotic left apical mass with chest wall invasion and destruction of the left second rib. 2. Surrounding fluctuating airspace opacities are favored to be secondary to postobstructive pneumonitis and radiation therapy. The distribution, appearance and fluctuation are not typical for lymphangitic spread of tumor.  07/31/2016 -  Chemotherapy    Opdivo      10/22/2016 Imaging    CT CAP- 1. Mild reduction in size of the dominant left upper lobe centrally necrotic mass. Early healing response of the pathologic fracture the left second rib. There has been no progressive bony destruction of the left second rib. 2. However, there has been increase in airspace opacity filling almost all of the left upper lobe, sparing part of the lingula. Associated air bronchograms. This may reflect radiation  pneumonitis. Underlying pneumonia not excluded. 3. No new metastatic lesions are identified. There continues to be a trace left pleural effusion. 4. Stable right paraspinal/pleural right apical mass, this was only weakly hypermetabolic previously and may certainly represent a nerve sheath tumor or similar benign lesion. Surveillance suggested. 5. Other imaging findings of potential clinical significance: Aortic arch and branch vessel atherosclerosis. Mild cardiomegaly. Stable hypodense thyroid nodules. Hypodense renal lesions, probably cysts. Scattered colonic and distal small bowel diverticulum with sigmoid colon diverticulosis. Aortoiliac atherosclerotic vascular disease.       She is doing well. She is tolerating chemotherapy well. She is here to review her most recent restaging scans.  She denies any symptoms suggestive of pneumonia or lower respiratory tract infection. She denies any cough or sputum production. She denies any shortness of breath or difficulty catching her breath. She is accompanied by her daughter.  Her husband became dizzy in the waiting room and therefore he was escorted by nursing staff to the emergency department.  She denies any complaints today.  Review of Systems  Constitutional: Negative.  Negative for chills, fever and weight loss.  Eyes: Negative.   Respiratory: Negative.  Negative for cough, sputum production and shortness of breath.   Cardiovascular: Negative.  Negative for chest pain.  Gastrointestinal: Negative.  Negative for constipation, diarrhea, nausea and vomiting.  Genitourinary: Negative.   Musculoskeletal: Negative.   Skin: Negative.   Neurological: Negative.  Negative for weakness.  Endo/Heme/Allergies: Negative.   Psychiatric/Behavioral: Negative.     Past Medical History:  Diagnosis Date  . Anemia   . Diabetes mellitus, type 2 ([facility])   . Hypertension   . Low vitamin B12 level 02/28/2016  . Obesity   . Pancoast tumor of left lung  ([facility]) 03/03/2016  . Sinus infection   . Tobacco abuse     Past Surgical History:  Procedure Laterality Date  . ABDOMINAL HYSTERECTOMY    . COLONOSCOPY N/A 02/18/2016   Procedure: COLONOSCOPY;  Surgeon: Daneil Dolin, MD;  Location: [facility];  Service: Endoscopy;  Laterality: N/A;  . ESOPHAGOGASTRODUODENOSCOPY N/A 02/18/2016   Procedure: ESOPHAGOGASTRODUODENOSCOPY (EGD);  Surgeon: Daneil Dolin, MD;  Location: [facility];  Service: Endoscopy;  Laterality: N/A;    Family History  Problem Relation Age of Onset  . Cancer Father     Social History   Social History  . Marital status: Married    Spouse name: N/A  . Number of children: N/A  . Years of education: N/A   Social History Main Topics  . Smoking status: Former Smoker    Packs/day: 0.50    Years: 30.00    Types: Cigarettes    Quit date: 03/13/2016  . Smokeless tobacco: Never Used  . Alcohol use No  . Drug use: No  . Sexual activity: No   Other Topics Concern  . None   Social History Narrative  . None     PHYSICAL EXAMINATION  ECOG PERFORMANCE STATUS: 1 - Symptomatic but  completely ambulatory  Vitals:   10/26/16 0951  BP: 118/87  Pulse: 96  Resp: 18  Temp: 99.3 F (37.4 C)    GENERAL:alert, no distress, well nourished, well developed, comfortable, cooperative, obese, smiling and accompanied by her daughter. SKIN: skin color, texture, turgor are normal, no rashes or significant lesions HEAD: Normocephalic, No masses, lesions, tenderness or abnormalities EYES: normal, EOMI, Conjunctiva are pink and non-injected EARS: External ears normal OROPHARYNX:lips, buccal mucosa, and tongue normal and mucous membranes are moist  NECK: supple, trachea midline LYMPH:  not examined BREAST:not examined LUNGS: clear to auscultation  HEART: regular rate & rhythm ABDOMEN:abdomen soft, obese and normal bowel sounds BACK: Back symmetric, no curvature. EXTREMITIES:less then 2 second capillary refill, no joint  deformities, effusion, or inflammation, no skin discoloration, no cyanosis  NEURO: alert & oriented x 3 with fluent speech, no focal motor/sensory deficits, gait normal   LABORATORY DATA: CBC    Component Value Date/Time   WBC 7.5 10/26/2016 1110   RBC 4.28 10/26/2016 1110   HGB 8.8 (L) 10/26/2016 1110   HCT 30.1 (L) 10/26/2016 1110   PLT 323 10/26/2016 1110   MCV 70.3 (L) 10/26/2016 1110   MCH 20.6 (L) 10/26/2016 1110   MCHC 29.2 (L) 10/26/2016 1110   RDW 21.0 (H) 10/26/2016 1110   LYMPHSABS 1.9 10/26/2016 1110   MONOABS 0.6 10/26/2016 1110   EOSABS 0.2 10/26/2016 1110   BASOSABS 0.0 10/26/2016 1110      Chemistry      Component Value Date/Time   NA 137 10/26/2016 1110   K 3.3 (L) 10/26/2016 1110   CL 98 (L) 10/26/2016 1110   CO2 28 10/26/2016 1110   BUN 15 10/26/2016 1110   CREATININE 1.03 (H) 10/26/2016 1110      Component Value Date/Time   CALCIUM 9.3 10/26/2016 1110   ALKPHOS 111 10/26/2016 1110   AST 18 10/26/2016 1110   ALT 17 10/26/2016 1110   BILITOT 0.4 10/26/2016 1110        PENDING LABS:   RADIOGRAPHIC STUDIES:  Ct Chest W Contrast  Result Date: 10/22/2016 CLINICAL DATA:  Restaging left-sided lung cancer (Pancoast tumor), original diagnosis May 2017. Interval chemotherapy and radiation. EXAM: CT CHEST, ABDOMEN, AND PELVIS WITH CONTRAST TECHNIQUE: Multidetector CT imaging of the chest, abdomen and pelvis was performed following the standard protocol during bolus administration of intravenous contrast. CONTRAST:  122m ISOVUE-300 IOPAMIDOL (ISOVUE-300) INJECTION 61% COMPARISON:  Multiple exams, including 07/17/2016 and PET-CT from 03/12/2016 FINDINGS: CT CHEST FINDINGS Cardiovascular: Aortic arch and branch vessel atherosclerotic calcification. Mild cardiomegaly. Mediastinum/Nodes: Hypodense thyroid nodules similar to prior exams. Left hilum obscured by airspace opacity, but no overt mediastinal adenopathy is identified. Lungs/Pleura: Increased airspace  opacity in the left upper lobe with near complete consolidation except for small part of the lingula, and with air bronchograms noted. Left apical mass again noted currently measuring about 10.4 cm in AP dimension, previously 11.6 cm, and currently about 5.2 cm transverse, previously by my measurements about 6.4 cm. I still see some early destructive findings of the left second rib but certainly no progression. Trace left pleural effusion is again noted. The tumor is primarily low density in likely centrally necrotic. There is some mild reticulonodular interstitial accentuation in the right upper lobe. Note is again made of thickening of the inferior pole 1 all pulmonary ligament on the left. A right paraspinal right apical pleural based mass measuring 3.9 by 2.8 cm by my measurements is stable and most likely a nerve  sheath tumor at the right lung apex, this was only very faintly hypermetabolic previously. Musculoskeletal: Mild destruction of the right second rib laterally, with some associated healing response associated with the pathologic fracture. Thoracic kyphosis and spondylosis. CT ABDOMEN PELVIS FINDINGS Hepatobiliary: Hypodensity inferiorly in segment 4 of the liver compatible with fatty focal infiltration. Gallbladder unremarkable. Pancreas: Unremarkable Spleen: Unremarkable Adrenals/Urinary Tract: Multiple hypodense renal lesions most of which a technically too small to characterize, but statistically likely to be cysts. Adrenal glands normal. Stomach/Bowel: Scattered colonic diverticula. There are a few distal ileal small diverticula. Sigmoid colon diverticulosis. Vascular/Lymphatic: Aortoiliac atherosclerotic vascular disease. Small bilateral inguinal lymph nodes are observed. A left external iliac node measures 0.7 cm in short axis on image 103/2. No pathologic adenopathy is observed. Reproductive: Uterus absent.  No significant adnexal abnormality. Other: No supplemental non-categorized findings.  Musculoskeletal: Unremarkable IMPRESSION: 1. Mild reduction in size of the dominant left upper lobe centrally necrotic mass. Early healing response of the pathologic fracture the left second rib. There has been no progressive bony destruction of the left second rib. 2. However, there has been increase in airspace opacity filling almost all of the left upper lobe, sparing part of the lingula. Associated air bronchograms. This may reflect radiation pneumonitis. Underlying pneumonia not excluded. 3. No new metastatic lesions are identified. There continues to be a trace left pleural effusion. 4. Stable right paraspinal/pleural right apical mass, this was only weakly hypermetabolic previously and may certainly represent a nerve sheath tumor or similar benign lesion. Surveillance suggested. 5. Other imaging findings of potential clinical significance: Aortic arch and branch vessel atherosclerosis. Mild cardiomegaly. Stable hypodense thyroid nodules. Hypodense renal lesions, probably cysts. Scattered colonic and distal small bowel diverticulum with sigmoid colon diverticulosis. Aortoiliac atherosclerotic vascular disease. Electronically Signed   By: Van Clines M.D.   On: 10/22/2016 16:10   Ct Abdomen Pelvis W Contrast  Result Date: 10/22/2016 CLINICAL DATA:  Restaging left-sided lung cancer (Pancoast tumor), original diagnosis May 2017. Interval chemotherapy and radiation. EXAM: CT CHEST, ABDOMEN, AND PELVIS WITH CONTRAST TECHNIQUE: Multidetector CT imaging of the chest, abdomen and pelvis was performed following the standard protocol during bolus administration of intravenous contrast. CONTRAST:  191m ISOVUE-300 IOPAMIDOL (ISOVUE-300) INJECTION 61% COMPARISON:  Multiple exams, including 07/17/2016 and PET-CT from 03/12/2016 FINDINGS: CT CHEST FINDINGS Cardiovascular: Aortic arch and branch vessel atherosclerotic calcification. Mild cardiomegaly. Mediastinum/Nodes: Hypodense thyroid nodules similar to prior  exams. Left hilum obscured by airspace opacity, but no overt mediastinal adenopathy is identified. Lungs/Pleura: Increased airspace opacity in the left upper lobe with near complete consolidation except for small part of the lingula, and with air bronchograms noted. Left apical mass again noted currently measuring about 10.4 cm in AP dimension, previously 11.6 cm, and currently about 5.2 cm transverse, previously by my measurements about 6.4 cm. I still see some early destructive findings of the left second rib but certainly no progression. Trace left pleural effusion is again noted. The tumor is primarily low density in likely centrally necrotic. There is some mild reticulonodular interstitial accentuation in the right upper lobe. Note is again made of thickening of the inferior pole 1 all pulmonary ligament on the left. A right paraspinal right apical pleural based mass measuring 3.9 by 2.8 cm by my measurements is stable and most likely a nerve sheath tumor at the right lung apex, this was only very faintly hypermetabolic previously. Musculoskeletal: Mild destruction of the right second rib laterally, with some associated healing response associated with the pathologic fracture.  Thoracic kyphosis and spondylosis. CT ABDOMEN PELVIS FINDINGS Hepatobiliary: Hypodensity inferiorly in segment 4 of the liver compatible with fatty focal infiltration. Gallbladder unremarkable. Pancreas: Unremarkable Spleen: Unremarkable Adrenals/Urinary Tract: Multiple hypodense renal lesions most of which a technically too small to characterize, but statistically likely to be cysts. Adrenal glands normal. Stomach/Bowel: Scattered colonic diverticula. There are a few distal ileal small diverticula. Sigmoid colon diverticulosis. Vascular/Lymphatic: Aortoiliac atherosclerotic vascular disease. Small bilateral inguinal lymph nodes are observed. A left external iliac node measures 0.7 cm in short axis on image 103/2. No pathologic adenopathy  is observed. Reproductive: Uterus absent.  No significant adnexal abnormality. Other: No supplemental non-categorized findings. Musculoskeletal: Unremarkable IMPRESSION: 1. Mild reduction in size of the dominant left upper lobe centrally necrotic mass. Early healing response of the pathologic fracture the left second rib. There has been no progressive bony destruction of the left second rib. 2. However, there has been increase in airspace opacity filling almost all of the left upper lobe, sparing part of the lingula. Associated air bronchograms. This may reflect radiation pneumonitis. Underlying pneumonia not excluded. 3. No new metastatic lesions are identified. There continues to be a trace left pleural effusion. 4. Stable right paraspinal/pleural right apical mass, this was only weakly hypermetabolic previously and may certainly represent a nerve sheath tumor or similar benign lesion. Surveillance suggested. 5. Other imaging findings of potential clinical significance: Aortic arch and branch vessel atherosclerosis. Mild cardiomegaly. Stable hypodense thyroid nodules. Hypodense renal lesions, probably cysts. Scattered colonic and distal small bowel diverticulum with sigmoid colon diverticulosis. Aortoiliac atherosclerotic vascular disease. Electronically Signed   By: Van Clines M.D.   On: 10/22/2016 16:10     PATHOLOGY:    ASSESSMENT AND PLAN:  Pancoast tumor of left lung ([facility]) Pancoast tumor of the L lung, completed concurrent chemo/XRT with only stable disease. She has a RUL nodule, not biopsied, but now on opdivo for stage IV disease beginning on 07/31/2016.  Oncology history is updated.  Pre-treatment labs: CBC diff, CMET, TSH.  I personally reviewed and went over laboratory results with the patient.  The results are noted within this dictation.  Labs satisfy treatment parameters.  Anemia is noted and stable. Mild hypokalemia is appreciated as well.  She'll return tomorrow for therapy.   Orders placed for 40 mEq of oral potassium tomorrow in the clinic.  I personally reviewed and went over radiographic studies with the patient.  The results are noted within this dictation.  Overall CT imaging is stable to improved.  Will continue with treatment as planned.  She reports vaginal and rectal she right [place] without rash or discharge. She declined examination by me today. I will refer her to gynecology for further evaluation and management.  Return in 2 weeks for ongoing treatment and 4 weeks for follow-up with treatment.   Low vitamin B12 level Low vitamin B12.  On monthly B12 injections.  B12 injection today.   ORDERS PLACED FOR THIS ENCOUNTER: Orders Placed This Encounter  Procedures  . CBC with Differential  . Comprehensive metabolic panel  . TSH    MEDICATIONS PRESCRIBED THIS ENCOUNTER: No orders of the defined types were placed in this encounter.   THERAPY PLAN:  Continue Nivolumab therapy as planned.  All questions were answered. The patient knows to call the clinic with any problems, questions or concerns. We can certainly see the patient much sooner if necessary.  Patient and plan discussed with Dr. Twana First and she is in agreement with the aforementioned.  This note is electronically signed by: Doy Mince 10/26/2016 5:12 PM

## 2016-10-26 NOTE — Patient Instructions (Signed)
[facility] Discharge Instructions  RECOMMENDATIONS MADE BY THE CONSULTANT AND ANY TEST RESULTS WILL BE SENT TO YOUR REFERRING PHYSICIAN.  You were seen today by Kirby Crigler, PA-C You will get treatment today We will call you when labs come back Follow up in 2 weeks for treatment Follow up in 4 weeks for treatment and return visit  Thank you for choosing [facility] at [facility] to provide your oncology and hematology care.  To afford each patient quality time with our provider, please arrive at least 15 minutes before your scheduled appointment time.    If you have a lab appointment with the [facility] please come in thru the  Main Entrance and check in at the main information desk  You need to re-schedule your appointment should you arrive 10 or more minutes late.  We strive to give you quality time with our providers, and arriving late affects you and other patients whose appointments are after yours.  Also, if you no show three or more times for appointments you may be dismissed from the clinic at the providers discretion.     Again, thank you for choosing [facility].  Our hope is that these requests will decrease the amount of time that you wait before being seen by our physicians.       _____________________________________________________________  Should you have questions after your visit to [facility], please contact our office at [phone] between the hours of 8:30 a.m. and 4:30 p.m.  Voicemails left after 4:30 p.m. will not be returned until the following business day.  For prescription refill requests, have your pharmacy contact our office.       Resources For Cancer Patients and their Caregivers ? American Cancer Society: Can assist with transportation, wigs, general needs, runs Look Good Feel Better.        424 404 4534 ? Cancer Care: Provides financial assistance, online  support groups, medication/co-pay assistance.  1-800-813-HOPE 773-298-3642) ? [place] Assists [place] cancer patients and their families through emotional , educational and financial support.  3190444533 ? [place] DSS Where to apply for food stamps, Medicaid and utility assistance. (919)013-1907 ? [facility]: Transportation to medical appointments. 323-050-6167 ? Social Security Administration: May apply for disability if have a Stage IV cancer. 618-773-1175 325 550 7096 ? [employer], Disability and Transit Services: Assists with nutrition, care and transit needs. [place] Support Programs: '@10RELATIVEDAYS'$ @ > Cancer Support Group  2nd Tuesday of the month 1pm-2pm, Journey Room  > Creative Journey  3rd Tuesday of the month 1130am-1pm, Journey Room  > Look Good Feel Better  1st Wednesday of the month 10am-12 noon, Journey Room (Call [facility] to register [phone])

## 2016-10-26 NOTE — Addendum Note (Signed)
Addended by: Josephina Shih A on: 10/26/2016 03:02 PM   Modules accepted: Orders

## 2016-10-27 ENCOUNTER — Other Ambulatory Visit (HOSPITAL_COMMUNITY): Payer: Self-pay | Admitting: Oncology

## 2016-10-27 ENCOUNTER — Encounter (HOSPITAL_BASED_OUTPATIENT_CLINIC_OR_DEPARTMENT_OTHER): Payer: Medicaid Other

## 2016-10-27 ENCOUNTER — Encounter: Payer: Self-pay | Admitting: Hematology

## 2016-10-27 VITALS — BP 112/69 | HR 56 | Temp 98.3°F | Resp 18

## 2016-10-27 DIAGNOSIS — Z5112 Encounter for antineoplastic immunotherapy: Secondary | ICD-10-CM

## 2016-10-27 DIAGNOSIS — C3412 Malignant neoplasm of upper lobe, left bronchus or lung: Secondary | ICD-10-CM

## 2016-10-27 MED ORDER — SODIUM CHLORIDE 0.9% FLUSH
10.0000 mL | INTRAVENOUS | Status: DC | PRN
  Administered 2016-10-27: 10 mL
  Filled 2016-10-27: qty 10

## 2016-10-27 MED ORDER — SODIUM CHLORIDE 0.9 % IV SOLN
Freq: Once | INTRAVENOUS | Status: AC
  Administered 2016-10-27: 11:00:00 via INTRAVENOUS

## 2016-10-27 MED ORDER — SODIUM CHLORIDE 0.9 % IV SOLN
240.0000 mg | Freq: Once | INTRAVENOUS | Status: AC
  Administered 2016-10-27: 240 mg via INTRAVENOUS
  Filled 2016-10-27: qty 24

## 2016-10-27 MED ORDER — HEPARIN SOD (PORK) LOCK FLUSH 100 UNIT/ML IV SOLN
500.0000 [IU] | Freq: Once | INTRAVENOUS | Status: AC | PRN
  Administered 2016-10-27: 500 [IU]
  Filled 2016-10-27: qty 5

## 2016-10-27 NOTE — Progress Notes (Signed)
Chemotherapy given today per orders. Patient tolerated it well. Vitals stable, discharged from clinic ambulatory with husband and follow up as scheduled.

## 2016-10-27 NOTE — Patient Instructions (Signed)
[facility] Discharge Instructions for Patients Receiving Chemotherapy   Beginning January 23rd 2017 lab work for the Cancer Center will be done in the  Main lab at [facility] on 1st floor. If you have a lab appointment with the Cancer Center please come in thru the  Main Entrance and check in at the main information desk   Today you received the following chemotherapy agents   To help prevent nausea and vomiting after your treatment, we encourage you to take your nausea medication     If you develop nausea and vomiting, or diarrhea that is not controlled by your medication, call the clinic.  The clinic phone number is [phone]. Office hours are Monday-Friday 8:30am-5:00pm.  BELOW ARE SYMPTOMS THAT SHOULD BE REPORTED IMMEDIATELY:  *FEVER GREATER THAN 101.0 F  *CHILLS WITH OR WITHOUT FEVER  NAUSEA AND VOMITING THAT IS NOT CONTROLLED WITH YOUR NAUSEA MEDICATION  *UNUSUAL SHORTNESS OF BREATH  *UNUSUAL BRUISING OR BLEEDING  TENDERNESS IN MOUTH AND THROAT WITH OR WITHOUT PRESENCE OF ULCERS  *URINARY PROBLEMS  *BOWEL PROBLEMS  UNUSUAL RASH Items with * indicate a potential emergency and should be followed up as soon as possible. If you have an emergency after office hours please contact your primary care physician or go to the nearest emergency department.  Please call the clinic during office hours if you have any questions or concerns.   You may also contact the Patient Navigator at [phone] should you have any questions or need assistance in obtaining follow up care.      Resources For Cancer Patients and their Caregivers ? American Cancer Society: Can assist with transportation, wigs, general needs, runs Look Good Feel Better.        1-888-227-6333 ? Cancer Care: Provides financial assistance, online support groups, medication/co-pay assistance.  1-800-813-HOPE (4673) ? [facility] Assists [place] cancer  patients and their families through emotional , educational and financial support.  336-427-4357 ? [place] DSS Where to apply for food stamps, Medicaid and utility assistance. 336-342-1394 ? RCATS: Transportation to medical appointments. 336-347-2287 ? Social Security Administration: May apply for disability if have a Stage IV cancer. 336-342-7796 1-800-772-1213 ? [place] Aging, Disability and Transit Services: Assists with nutrition, care and transit needs. 336-349-2343         

## 2016-11-09 ENCOUNTER — Ambulatory Visit (HOSPITAL_COMMUNITY): Payer: Medicaid Other

## 2016-11-10 ENCOUNTER — Encounter (HOSPITAL_BASED_OUTPATIENT_CLINIC_OR_DEPARTMENT_OTHER): Payer: Medicaid Other

## 2016-11-10 VITALS — BP 144/71 | HR 90 | Temp 99.0°F | Resp 18 | Wt 153.0 lb

## 2016-11-10 DIAGNOSIS — R918 Other nonspecific abnormal finding of lung field: Secondary | ICD-10-CM

## 2016-11-10 DIAGNOSIS — C3412 Malignant neoplasm of upper lobe, left bronchus or lung: Secondary | ICD-10-CM | POA: Diagnosis not present

## 2016-11-10 DIAGNOSIS — Z5112 Encounter for antineoplastic immunotherapy: Secondary | ICD-10-CM

## 2016-11-10 DIAGNOSIS — D649 Anemia, unspecified: Secondary | ICD-10-CM

## 2016-11-10 LAB — COMPREHENSIVE METABOLIC PANEL
ALBUMIN: 2.9 g/dL — AB (ref 3.5–5.0)
ALK PHOS: 85 U/L (ref 38–126)
ALT: 10 U/L — AB (ref 14–54)
ANION GAP: 9 (ref 5–15)
AST: 16 U/L (ref 15–41)
BUN: 17 mg/dL (ref 6–20)
CALCIUM: 9.2 mg/dL (ref 8.9–10.3)
CO2: 28 mmol/L (ref 22–32)
Chloride: 101 mmol/L (ref 101–111)
Creatinine, Ser: 1.05 mg/dL — ABNORMAL HIGH (ref 0.44–1.00)
GFR calc Af Amer: >60 mL/min (ref >60)
GFR calc non Af Amer: 55 mL/min — ABNORMAL LOW (ref >60)
GLUCOSE: 172 mg/dL — AB (ref 65–99)
Potassium: 3.5 mmol/L (ref 3.5–5.1)
SODIUM: 138 mmol/L (ref 135–145)
Total Bilirubin: 0.3 mg/dL (ref 0.3–1.2)
Total Protein: 7.3 g/dL (ref 6.5–8.1)

## 2016-11-10 LAB — CBC WITH DIFFERENTIAL/PLATELET
BASOS ABS: 0 10*3/uL (ref 0.0–0.1)
Basophils Relative: 0 %
Eosinophils Absolute: 0.1 10*3/uL (ref 0.0–0.7)
Eosinophils Relative: 1 %
HCT: 29.4 % — ABNORMAL LOW (ref 36.0–46.0)
HEMOGLOBIN: 8.6 g/dL — AB (ref 12.0–15.0)
LYMPHS PCT: 23 %
Lymphs Abs: 1.9 10*3/uL (ref 0.7–4.0)
MCH: 20.4 pg — ABNORMAL LOW (ref 26.0–34.0)
MCHC: 29.3 g/dL — ABNORMAL LOW (ref 30.0–36.0)
MCV: 69.7 fL — ABNORMAL LOW (ref 78.0–100.0)
MONOS PCT: 5 %
Monocytes Absolute: 0.4 10*3/uL (ref 0.1–1.0)
Neutro Abs: 5.8 10*3/uL (ref 1.7–7.7)
Neutrophils Relative %: 71 %
Platelets: 355 10*3/uL (ref 150–400)
RBC: 4.22 MIL/uL (ref 3.87–5.11)
RDW: 20.6 % — ABNORMAL HIGH (ref 11.5–15.5)
WBC: 8.2 10*3/uL (ref 4.0–10.5)

## 2016-11-10 MED ORDER — HYDROCODONE-ACETAMINOPHEN 5-325 MG PO TABS: 1.0000 | ORAL_TABLET | ORAL | 0 refills | Status: DC | PRN

## 2016-11-10 MED ORDER — SODIUM CHLORIDE 0.9 % IV SOLN
240.0000 mg | Freq: Once | INTRAVENOUS | Status: AC
  Administered 2016-11-10: 240 mg via INTRAVENOUS
  Filled 2016-11-10: qty 24

## 2016-11-10 MED ORDER — HEPARIN SOD (PORK) LOCK FLUSH 100 UNIT/ML IV SOLN
500.0000 [IU] | Freq: Once | INTRAVENOUS | Status: AC | PRN
  Administered 2016-11-10: 500 [IU]
  Filled 2016-11-10: qty 5

## 2016-11-10 MED ORDER — SODIUM CHLORIDE 0.9% FLUSH
10.0000 mL | INTRAVENOUS | Status: DC | PRN
  Administered 2016-11-10: 10 mL
  Filled 2016-11-10: qty 10

## 2016-11-10 MED ORDER — SODIUM CHLORIDE 0.9 % IV SOLN
Freq: Once | INTRAVENOUS | Status: AC
  Administered 2016-11-10: 12:00:00 via INTRAVENOUS

## 2016-11-10 NOTE — Progress Notes (Signed)
Susan Mahoney tolerated chemo tx well without complaints or incident. Labs reviewed prior to administering Opdivo. VSS upon discharge, Pt discharged self ambulatory in satisfactory condition accompanied by her husband

## 2016-11-10 NOTE — Patient Instructions (Signed)
[facility] Discharge Instructions for Patients Receiving Chemotherapy   Beginning January 23rd 2017 lab work for the [facility] will be done in the  Main lab at [facility] on 1st floor. If you have a lab appointment with the [facility] please come in thru the  Main Entrance and check in at the main information desk   Today you received the following chemotherapy agents Opdivo. Follow-up as scheduled. Call clinic for any questions or concerns  To help prevent nausea and vomiting after your treatment, we encourage you to take your nausea medication   If you develop nausea and vomiting, or diarrhea that is not controlled by your medication, call the clinic.  The clinic phone number is [phone]. Office hours are Monday-Friday 8:30am-5:00pm.  BELOW ARE SYMPTOMS THAT SHOULD BE REPORTED IMMEDIATELY:  *FEVER GREATER THAN 101.0 F  *CHILLS WITH OR WITHOUT FEVER  NAUSEA AND VOMITING THAT IS NOT CONTROLLED WITH YOUR NAUSEA MEDICATION  *UNUSUAL SHORTNESS OF BREATH  *UNUSUAL BRUISING OR BLEEDING  TENDERNESS IN MOUTH AND THROAT WITH OR WITHOUT PRESENCE OF ULCERS  *URINARY PROBLEMS  *BOWEL PROBLEMS  UNUSUAL RASH Items with * indicate a potential emergency and should be followed up as soon as possible. If you have an emergency after office hours please contact your primary care physician or go to the nearest emergency department.  Please call the clinic during office hours if you have any questions or concerns.   You may also contact the Patient Navigator at [phone] should you have any questions or need assistance in obtaining follow up care.      Resources For Cancer Patients and their Caregivers ? American Cancer Society: Can assist with transportation, wigs, general needs, runs Look Good Feel Better.        316-501-4829 ? Cancer Care: Provides financial assistance, online support groups, medication/co-pay assistance.  1-800-813-HOPE  339-858-3888) ? [place] Assists [place] cancer patients and their families through emotional , educational and financial support.  203-629-3344 ? [place] DSS Where to apply for food stamps, Medicaid and utility assistance. (629)770-2527 ? RCATS: Transportation to medical appointments. 801-817-0924 ? Social Security Administration: May apply for disability if have a Stage IV cancer. 210-512-7714 252-769-8486 ? [employer], Disability and Transit Services: Assists with nutrition, care and transit needs. [phone]

## 2016-11-16 ENCOUNTER — Encounter: Payer: Self-pay | Admitting: Adult Health

## 2016-11-23 ENCOUNTER — Telehealth (HOSPITAL_COMMUNITY): Payer: Self-pay | Admitting: Emergency Medicine

## 2016-11-23 ENCOUNTER — Ambulatory Visit (HOSPITAL_COMMUNITY): Payer: Medicaid Other

## 2016-11-23 NOTE — Telephone Encounter (Signed)
Pt wanted to verify when appt was.  11/24/2016 at 9:50 to see Dr Talbert Cage then she will have chemo and B12 injection.  Pt verbalized understanding.

## 2016-11-24 ENCOUNTER — Encounter (HOSPITAL_BASED_OUTPATIENT_CLINIC_OR_DEPARTMENT_OTHER): Payer: Self-pay

## 2016-11-24 ENCOUNTER — Encounter (HOSPITAL_COMMUNITY): Payer: Self-pay

## 2016-11-24 ENCOUNTER — Encounter (HOSPITAL_COMMUNITY): Payer: Self-pay | Attending: Oncology | Admitting: Oncology

## 2016-11-24 VITALS — BP 135/68 | HR 63 | Temp 98.3°F | Resp 16

## 2016-11-24 VITALS — BP 149/65 | HR 88 | Temp 98.6°F | Resp 16 | Wt 157.7 lb

## 2016-11-24 DIAGNOSIS — Z5112 Encounter for antineoplastic immunotherapy: Secondary | ICD-10-CM

## 2016-11-24 DIAGNOSIS — C3412 Malignant neoplasm of upper lobe, left bronchus or lung: Secondary | ICD-10-CM

## 2016-11-24 DIAGNOSIS — E538 Deficiency of other specified B group vitamins: Secondary | ICD-10-CM

## 2016-11-24 LAB — CBC WITH DIFFERENTIAL/PLATELET
BASOS ABS: 0 10*3/uL (ref 0.0–0.1)
Basophils Relative: 0 %
Eosinophils Absolute: 0.1 10*3/uL (ref 0.0–0.7)
Eosinophils Relative: 1 %
HEMATOCRIT: 30.1 % — AB (ref 36.0–46.0)
Hemoglobin: 8.7 g/dL — ABNORMAL LOW (ref 12.0–15.0)
LYMPHS ABS: 1.9 10*3/uL (ref 0.7–4.0)
LYMPHS PCT: 23 %
MCH: 20.2 pg — ABNORMAL LOW (ref 26.0–34.0)
MCHC: 28.9 g/dL — ABNORMAL LOW (ref 30.0–36.0)
MCV: 69.8 fL — ABNORMAL LOW (ref 78.0–100.0)
MONOS PCT: 6 %
Monocytes Absolute: 0.5 10*3/uL (ref 0.1–1.0)
Neutro Abs: 5.6 10*3/uL (ref 1.7–7.7)
Neutrophils Relative %: 70 %
Platelets: 329 10*3/uL (ref 150–400)
RBC: 4.31 MIL/uL (ref 3.87–5.11)
RDW: 20.9 % — AB (ref 11.5–15.5)
WBC: 8.1 10*3/uL (ref 4.0–10.5)

## 2016-11-24 LAB — COMPREHENSIVE METABOLIC PANEL
ALBUMIN: 3 g/dL — AB (ref 3.5–5.0)
ALT: 18 U/L (ref 14–54)
AST: 19 U/L (ref 15–41)
Alkaline Phosphatase: 98 U/L (ref 38–126)
Anion gap: 8 (ref 5–15)
BILIRUBIN TOTAL: 0.5 mg/dL (ref 0.3–1.2)
BUN: 17 mg/dL (ref 6–20)
CHLORIDE: 103 mmol/L (ref 101–111)
CO2: 29 mmol/L (ref 22–32)
Calcium: 9.5 mg/dL (ref 8.9–10.3)
Creatinine, Ser: 1.07 mg/dL — ABNORMAL HIGH (ref 0.44–1.00)
GFR calc Af Amer: >60 mL/min (ref >60)
GFR calc non Af Amer: 54 mL/min — ABNORMAL LOW (ref >60)
GLUCOSE: 116 mg/dL — AB (ref 65–99)
POTASSIUM: 4 mmol/L (ref 3.5–5.1)
SODIUM: 140 mmol/L (ref 135–145)
Total Protein: 7.1 g/dL (ref 6.5–8.1)

## 2016-11-24 LAB — TSH: TSH: 1.234 u[IU]/mL (ref 0.350–4.500)

## 2016-11-24 MED ORDER — HEPARIN SOD (PORK) LOCK FLUSH 100 UNIT/ML IV SOLN
500.0000 [IU] | Freq: Once | INTRAVENOUS | Status: AC | PRN
  Administered 2016-11-24: 500 [IU]

## 2016-11-24 MED ORDER — MORPHINE SULFATE ER 15 MG PO TBCR: 15.0000 mg | EXTENDED_RELEASE_TABLET | Freq: Two times a day (BID) | ORAL | 0 refills | Status: AC

## 2016-11-24 MED ORDER — HEPARIN SOD (PORK) LOCK FLUSH 100 UNIT/ML IV SOLN
INTRAVENOUS | Status: AC
  Filled 2016-11-24: qty 5

## 2016-11-24 MED ORDER — SODIUM CHLORIDE 0.9 % IV SOLN
240.0000 mg | Freq: Once | INTRAVENOUS | Status: AC
  Administered 2016-11-24: 240 mg via INTRAVENOUS
  Filled 2016-11-24: qty 24

## 2016-11-24 MED ORDER — CYANOCOBALAMIN 1000 MCG/ML IJ SOLN
INTRAMUSCULAR | Status: AC
  Filled 2016-11-24: qty 1

## 2016-11-24 MED ORDER — SODIUM CHLORIDE 0.9 % IV SOLN
Freq: Once | INTRAVENOUS | Status: AC
  Administered 2016-11-24: 11:00:00 via INTRAVENOUS

## 2016-11-24 MED ORDER — CYANOCOBALAMIN 1000 MCG/ML IJ SOLN
1000.0000 ug | Freq: Once | INTRAMUSCULAR | Status: AC
  Administered 2016-11-24: 1000 ug via INTRAMUSCULAR

## 2016-11-24 NOTE — Patient Instructions (Signed)
[place] at [facility] Discharge Instructions  RECOMMENDATIONS MADE BY THE CONSULTANT AND ANY TEST RESULTS WILL BE SENT TO YOUR REFERRING PHYSICIAN.  You were seen today by Dr. Twana First Follow up in 4 weeks See Amy up front for appointments   Thank you for choosing [place] at [facility] to provide your oncology and hematology care.  To afford each patient quality time with our provider, please arrive at least 15 minutes before your scheduled appointment time.    If you have a lab appointment with the [facility] please come in thru the  Main Entrance and check in at the main information desk  You need to re-schedule your appointment should you arrive 10 or more minutes late.  We strive to give you quality time with our providers, and arriving late affects you and other patients whose appointments are after yours.  Also, if you no show three or more times for appointments you may be dismissed from the clinic at the providers discretion.     Again, thank you for choosing [facility].  Our hope is that these requests will decrease the amount of time that you wait before being seen by our physicians.       _____________________________________________________________  Should you have questions after your visit to [facility], please contact our office at [phone] between the hours of 8:30 a.m. and 4:30 p.m.  Voicemails left after 4:30 p.m. will not be returned until the following business day.  For prescription refill requests, have your pharmacy contact our office.       Resources For Cancer Patients and their Caregivers ? American Cancer Society: Can assist with transportation, wigs, general needs, runs Look Good Feel Better.        256-600-9563 ? Cancer Care: Provides financial assistance, online support groups, medication/co-pay assistance.  1-800-813-HOPE (667)658-6761) ? [facility] Assists [place] cancer patients and their families through emotional , educational and financial support.  316 742 8484 ? [place] DSS Where to apply for food stamps, Medicaid and utility assistance. (561)455-4719 ? RCATS: Transportation to medical appointments. 678-109-1334 ? Social Security Administration: May apply for disability if have a Stage IV cancer. (708)271-4226 270-106-8614 ? [employer], Disability and Transit Services: Assists with nutrition, care and transit needs. [place] Support Programs: '@10RELATIVEDAYS'$ @ > Cancer Support Group  2nd Tuesday of the month 1pm-2pm, Journey Room  > Creative Journey  3rd Tuesday of the month 1130am-1pm, Journey Room  > Look Good Feel Better  1st Wednesday of the month 10am-12 noon, Journey Room (Call [place] to register [phone])

## 2016-11-24 NOTE — Progress Notes (Signed)
Susan Fairy, PA-C [address] [place] [address]  Pancoast tumor of left lung [facility])  CURRENT THERAPY: Nivolumab beginning on 07/31/2016  INTERVAL HISTORY: Susan Mahoney 66 y.o. female returns for followup of pancoast tumor of the L lung, completed concurrent chemo/XRT with only stable disease. She has a RUL nodule, not biopsied, but now on opdivo for stage IV disease.     Pancoast tumor of left lung ([place])   02/16/2016 - 02/20/2016 Hospital Admission    Symptomatic anemia, lung mass      02/17/2016 Imaging    CT chest with 9.6 x 6.1 x 6.6 cm macrolobulated mass in LUL with evidence of direct chest wall invasion, surrounding opacities ? early lymphangitic spread. No mediastinal or hilar LAD. pleural based lesion R apex prob cystic present 09/19/2013      02/18/2016 Procedure    Colonoscopy with diverticulosis, five polyps      02/19/2016 Initial Biopsy    Ct biopsy in IR      02/19/2016 Pathology Results    poorly diff malignancy, unclear phenotype. carcinoma and melanoma are ruled out, calretinin positive staining may be seen in mesotheliomas in addition to other tumors      02/19/2016 Procedure    EGD medium sized hiatal hernia, non obstructing schatzki ring, esophagitis      03/12/2016 PET scan    Hypermetabolic LUL lung mass with direct chest wall invasion. Lower level hypermetabolism within the surrounding ground-glass opacity and septal thickening, most consistent with lympangitic spread. No thoracic adenopathy.       03/20/2016 Procedure    Port placed      03/31/2016 - 05/12/2016 Chemotherapy    Weekly Carboplatin/Paclitaxel with XRT      03/31/2016 - 05/14/2016 Radiation Therapy    XRT in [place] with Dr. Tammi Klippel for 33 fractions over 6.5 weeks.        04/28/2016 Imaging    Chest xray- Enlarging left apical mass no occupying about 50% of the left hemithorax, significantly increased from 02/19/2016. Probable bony destruction involving the left  anterior second rib.      05/06/2016 Pathology Results    02/18/14 specimen- PDL1 is low positive with 10% tumor score      05/13/2016 Pathology Results    [facility]- Alterations identified: CDKN2A/B loss, SLIT2 G715, TP53 P151T.  Additional findings: MS-Stable, tumor mutation burden- intermediate (11Muts/Mb).      06/11/2016 Imaging    CT chest- Increased size of left lung apex mass with persistent invasion into the lateral chest wall. 2. Worsening ground-glass opacity within the left lung, now including most of the left upper lobe and lingula, likely indicating progression of lymphangitic tumor spread. 3. New area of ground-glass opacity in the right upper lobe is also concerning for lymphangitic tumor spread. 4. Slight increase in size of right apical mass.      06/11/2016 Progression    CT scan of chest demonstrates lymphangitic spread.      07/17/2016 Imaging    CT chest- No significant change in dominant necrotic left apical mass with chest wall invasion and destruction of the left second rib. 2. Surrounding fluctuating airspace opacities are favored to be secondary to postobstructive pneumonitis and radiation therapy. The distribution, appearance and fluctuation are not typical for lymphangitic spread of tumor.      07/31/2016 -  Chemotherapy    Opdivo      10/22/2016 Imaging    CT CAP- 1. Mild reduction  in size of the dominant left upper lobe centrally necrotic mass. Early healing response of the pathologic fracture the left second rib. There has been no progressive bony destruction of the left second rib. 2. However, there has been increase in airspace opacity filling almost all of the left upper lobe, sparing part of the lingula. Associated air bronchograms. This may reflect radiation pneumonitis. Underlying pneumonia not excluded. 3. No new metastatic lesions are identified. There continues to be a trace left pleural effusion. 4. Stable right  paraspinal/pleural right apical mass, this was only weakly hypermetabolic previously and may certainly represent a nerve sheath tumor or similar benign lesion. Surveillance suggested. 5. Other imaging findings of potential clinical significance: Aortic arch and branch vessel atherosclerosis. Mild cardiomegaly. Stable hypodense thyroid nodules. Hypodense renal lesions, probably cysts. Scattered colonic and distal small bowel diverticulum with sigmoid colon diverticulosis. Aortoiliac atherosclerotic vascular disease.       Susan Mahoney presents to the clinic today accompanied by her family for day 1, cycle 9 Nivolumab.   She states she has fatigue with her treatment.   She states she has chest pain around the location of her tumor.  Denies decreased appetite, and insomnia, abdominal pain.   She requested a refill on her MS Contin.  The patient does not have any other questions or concerns at this time.    Review of Systems  Constitutional: Positive for malaise/fatigue (with treatment).       Denies decreased appetite.  HENT: Negative.   Eyes: Negative.   Respiratory: Negative.   Cardiovascular: Positive for chest pain (near the location of her tumor).  Gastrointestinal: Negative.  Negative for abdominal pain.  Genitourinary: Negative.   Musculoskeletal: Negative.   Skin: Negative.   Neurological: Negative.   Endo/Heme/Allergies: Negative.   Psychiatric/Behavioral: Negative.  The patient does not have insomnia.   All other systems reviewed and are negative.   Past Medical History:  Diagnosis Date  . Anemia   . Diabetes mellitus, type 2 ([place])   . Hypertension   . Low vitamin B12 level 02/28/2016  . Obesity   . Pancoast tumor of left lung ([place]) 03/03/2016  . Sinus infection   . Tobacco abuse     Past Surgical History:  Procedure Laterality Date  . ABDOMINAL HYSTERECTOMY    . COLONOSCOPY N/A 02/18/2016   Procedure: COLONOSCOPY;  Surgeon: Daneil Dolin, MD;  [facility]  [facility];  Service: Endoscopy;  Laterality: N/A;  . ESOPHAGOGASTRODUODENOSCOPY N/A 02/18/2016   Procedure: ESOPHAGOGASTRODUODENOSCOPY (EGD);  Surgeon: Daneil Dolin, MD;  Location: [facility];  Service: Endoscopy;  Laterality: N/A;    Family History  Problem Relation Age of Onset  . Cancer Father     Social History   Social History  . Marital status: Married    Spouse name: N/A  . Number of children: N/A  . Years of education: N/A   Social History Main Topics  . Smoking status: Former Smoker    Packs/day: 0.50    Years: 30.00    Types: Cigarettes    Quit date: 03/13/2016  . Smokeless tobacco: Never Used  . Alcohol use No  . Drug use: No  . Sexual activity: No   Other Topics Concern  . None   Social History Narrative  . None     PHYSICAL EXAMINATION  ECOG PERFORMANCE STATUS: 1 - Symptomatic but completely ambulatory  Vitals:   11/24/16 0933  BP: (!) 149/65  Pulse: 88  Resp: 16  Temp:  98.6 F (37 C)   Filed Weights   11/24/16 0933  Weight: 157 lb 11.2 oz (71.5 kg)     Physical Exam  Constitutional: She is oriented to person, place, and time and well-developed, well-nourished, and in no distress.  HENT:  Head: Normocephalic and atraumatic.  Eyes: Conjunctivae and EOM are normal. Pupils are equal, round, and reactive to light.  Neck: Normal range of motion. Neck supple.  Fullness in right supraclavicular region  Cardiovascular: Normal rate, regular rhythm and normal heart sounds.   Pulmonary/Chest: Effort normal.  Decreased breath sounds in right upper lobe.    Abdominal: Soft. Bowel sounds are normal.  Musculoskeletal: Normal range of motion.  Neurological: She is alert and oriented to person, place, and time. Gait normal.  Skin: Skin is warm and dry.  Nursing note and vitals reviewed.    LABORATORY DATA: CBC    Component Value Date/Time   WBC 8.2 11/10/2016 1043   RBC 4.22 11/10/2016 1043   HGB 8.6 (L) 11/10/2016 1043   HCT 29.4 (L)  11/10/2016 1043   PLT 355 11/10/2016 1043   MCV 69.7 (L) 11/10/2016 1043   MCH 20.4 (L) 11/10/2016 1043   MCHC 29.3 (L) 11/10/2016 1043   RDW 20.6 (H) 11/10/2016 1043   LYMPHSABS 1.9 11/10/2016 1043   MONOABS 0.4 11/10/2016 1043   EOSABS 0.1 11/10/2016 1043   BASOSABS 0.0 11/10/2016 1043      Chemistry      Component Value Date/Time   NA 138 11/10/2016 1043   K 3.5 11/10/2016 1043   CL 101 11/10/2016 1043   CO2 28 11/10/2016 1043   BUN 17 11/10/2016 1043   CREATININE 1.05 (H) 11/10/2016 1043      Component Value Date/Time   CALCIUM 9.2 11/10/2016 1043   ALKPHOS 85 11/10/2016 1043   AST 16 11/10/2016 1043   ALT 10 (L) 11/10/2016 1043   BILITOT 0.3 11/10/2016 1043        PENDING LABS:   RADIOGRAPHIC STUDIES: I have personally reviewed the radiological images as listed and agreed with the findings in the report. No results found.  CT CHEST, ABDOMEN, AND PELVIS WITH CONTRAST 10/22/2016  IMPRESSION: 1. Mild reduction in size of the dominant left upper lobe centrally necrotic mass. Early healing response of the pathologic fracture the left second rib. There has been no progressive bony destruction of the left second rib. 2. However, there has been increase in airspace opacity filling almost all of the left upper lobe, sparing part of the lingula. Associated air bronchograms. This may reflect radiation pneumonitis. Underlying pneumonia not excluded. 3. No new metastatic lesions are identified. There continues to be a trace left pleural effusion. 4. Stable right paraspinal/pleural right apical mass, this was only weakly hypermetabolic previously and may certainly represent a nerve sheath tumor or similar benign lesion. Surveillance suggested. 5. Other imaging findings of potential clinical significance: Aortic arch and branch vessel atherosclerosis. Mild cardiomegaly. Stable hypodense thyroid nodules. Hypodense renal lesions, probably cysts. Scattered colonic and  distal small bowel diverticulum with sigmoid colon diverticulosis. Aortoiliac atherosclerotic vascular disease.      PATHOLOGY:     ASSESSMENT AND PLAN:  No problem-specific Assessment & Plan notes found for this encounter.   ORDERS PLACED FOR THIS ENCOUNTER: No orders of the defined types were placed in this encounter.   MEDICATIONS PRESCRIBED THIS ENCOUNTER: Meds ordered this encounter  Medications  . morphine (MS CONTIN) 15 MG 12 hr tablet    Sig: Take 1  tablet (15 mg total) by mouth every 12 (twelve) hours.    Dispense:  60 tablet    Refill:  0    THERAPY PLAN:  Continue Nivolumab therapy as planned. Reviewed her restaging CT chest/abd/pelvis from 10/22/16 with her and her family. Plan to repeat scans in 3 months.  I refilled her morphine (MS CONTIN). RTC in 4 weeks.     All questions were answered. The patient knows to call the clinic with any problems, questions or concerns. We can certainly see the patient much sooner if necessary.  This document serves as a record of services personally performed by Twana First, MD. It was created on her behalf by Shirlean Mylar, a trained medical scribe. The creation of this record is based on the scribe's personal observations and the provider's statements to them. This document has been checked and approved by the attending provider.  I have reviewed the above documentation for accuracy and completeness and I agree with the above.  This note is electronically signed by: Mikey College 11/24/2016 9:49 AM

## 2016-11-24 NOTE — Progress Notes (Signed)
Susan Mahoney presents today for injection per MD orders. B12 1000 mcg administered IM in left Upper Arm. Administration without incident. Patient tolerated well.   Tolerated chemo well. Stable and ambulatory on discharge home with husband.

## 2016-11-30 ENCOUNTER — Encounter: Payer: Self-pay | Admitting: Adult Health

## 2016-12-01 ENCOUNTER — Other Ambulatory Visit (HOSPITAL_COMMUNITY): Payer: Self-pay | Admitting: Emergency Medicine

## 2016-12-01 DIAGNOSIS — R918 Other nonspecific abnormal finding of lung field: Secondary | ICD-10-CM

## 2016-12-01 DIAGNOSIS — C3412 Malignant neoplasm of upper lobe, left bronchus or lung: Secondary | ICD-10-CM

## 2016-12-01 MED ORDER — HYDROCODONE-ACETAMINOPHEN 5-325 MG PO TABS: 1.0000 | ORAL_TABLET | ORAL | 0 refills | Status: AC | PRN

## 2016-12-01 NOTE — Progress Notes (Unsigned)
Pt hydrocodone refilled

## 2016-12-08 ENCOUNTER — Other Ambulatory Visit (HOSPITAL_COMMUNITY): Payer: Self-pay | Admitting: Oncology

## 2016-12-08 ENCOUNTER — Ambulatory Visit (HOSPITAL_COMMUNITY): Payer: Self-pay

## 2016-12-08 ENCOUNTER — Encounter (HOSPITAL_COMMUNITY): Payer: Self-pay

## 2016-12-08 DIAGNOSIS — C3412 Malignant neoplasm of upper lobe, left bronchus or lung: Secondary | ICD-10-CM

## 2016-12-08 DIAGNOSIS — R410 Disorientation, unspecified: Secondary | ICD-10-CM

## 2016-12-08 NOTE — Progress Notes (Signed)
Husband reports multiple issues/concerns with patient. States initially he thinks she was taking too much pain medication. He reports patient over the past week couldn't sit herself up on side of bed, couldn't dress herself, couldn't walk to the bathroom. He reports confusion and delayed responses in his conversations with patient. He reports she ran out of hydrocodone before pharmacy was allowed to refill it so he thought she was taking too much. Questioned patient about use of hydrocodone. She states she takes 2 pills every couple of hours. Asked her about morphine pills and she states she wakes up about 3am everyday and takes 1 morphine tablet every day. Verified pill count, morphine filled 11/24/16 #60, #57 remain today. Hydrocodone filled 12/05/16 #120, #105 remain today. Patient able to state today is Tuesday and the year is 2018. Asked her if she had any headaches or dizziness and she said "yes, head hurts from top of her body to her feet". Patient's response are slow. Additional questions asked but inappropriate yes only responses given. Asked patient about any deficits with arm strength, leg strength, etc. She reports everything is good. Husband reports those responses are incorrect. He reports she can't walk independently, she is confused about medications, sleeps all the time, takes long periods of time before she responds to his questions. Discussed above with T.Kefalas,PA. HOLD opdivo treatment today. MRI brain STAT (radiology can not do until 6pm today). MRI results will determine treatment going forward. Husband/patient will be notified of appointments after MRI results. Discussed above with husband, he was loud and upset about the time of the MRI. States they are 66 yrs old and driving back tonight is not an option. Husband ranted about radiology always rescheduling appointments and the emergency room "lost" her one time. Husband wanted to know who was in charge of the patient because he wanted to talk  to the big man in charge. Asked husband if he wanted to speak with the president of the hospital or our department head and he continued to be loud and ranting about the same issues. I ask the husband not to be upset or loud and we could easily change the MRI appointment. He states "I don't get upset I get busy"! Walked husband out of the room and up the hall to the scheduler. He continued to be loud but states he isn't mad and he doesn't need to talk to anybody because he already complained to the ED and radiology before when he didn't like what was going on. Husband states he does not have issues at present with our department. Patient home with husband, verbalized she understands what is going on. Informed both patient and husband they will be called after MRI results reviewed and appointments will be scheduled then.

## 2016-12-10 ENCOUNTER — Encounter: Payer: Self-pay | Admitting: Adult Health

## 2016-12-10 ENCOUNTER — Ambulatory Visit (INDEPENDENT_AMBULATORY_CARE_PROVIDER_SITE_OTHER): Payer: Self-pay | Admitting: Adult Health

## 2016-12-10 VITALS — BP 140/80 | HR 90 | Ht 59.5 in | Wt 151.0 lb

## 2016-12-10 DIAGNOSIS — L80 Vitiligo: Secondary | ICD-10-CM | POA: Insufficient documentation

## 2016-12-10 DIAGNOSIS — N952 Postmenopausal atrophic vaginitis: Secondary | ICD-10-CM

## 2016-12-10 DIAGNOSIS — N898 Other specified noninflammatory disorders of vagina: Secondary | ICD-10-CM | POA: Insufficient documentation

## 2016-12-10 MED ORDER — ESTROGENS, CONJUGATED 0.625 MG/GM VA CREA: TOPICAL_CREAM | VAGINAL | 0 refills | Status: DC

## 2016-12-10 MED ORDER — ESTROGENS, CONJUGATED 0.625 MG/GM VA CREA
TOPICAL_CREAM | VAGINAL | 0 refills | Status: AC
Start: 2016-12-10 — End: ?

## 2016-12-10 NOTE — Progress Notes (Signed)
Subjective:     Patient ID: Susan Mahoney, female   DOB: Aug 30, 1951, 65 y.o.   MRN: [id number]  HPI Susan Mahoney is a 66 year old black female, married in complaining of vaginal irritation.She has had lung cancer. PCP is [facility] in [place].  Review of Systems Vaginal irritation Reviewed past medical,surgical, social and family history. Reviewed medications and allergies.     Objective:   Physical Exam BP 140/80 (BP Location: Left Arm, Patient Position: Sitting, Cuff Size: Normal)   Pulse 90   Ht 4' 11.5" (1.511 m)   Wt 151 lb (68.5 kg)   BMI 29.99 kg/m    Skin warm and dry.Pelvic: external genitalia, has vitiligo present and tissues are thin at introitus and irritated, Dr Glo Herring in to co-exam, vagina:pale with loss of moisture and rugae,urethra has no lesions or masses noted, cervix and uterus are absent, adnexa: no masses or tenderness noted. Bladder is non tender and no masses felt PHQ 9 score 18, she denies being suicidal or homicidal but is sad by all her health problems, declines meds. Will try premarin vaginal cream, she denies ever having stroke,MI, breast cancer or DVT.  Assessment:     1. Vaginal irritation   2. Vitiligo   3. Vaginal atrophy       Plan:     Use premarin vaginal cream pea sized amount to affected area daily x 2 weeks then 2-3 x daily, 24 gm given lot [id number] exp 4/19 Follow up in 4 weeks with me or before if needed

## 2016-12-11 ENCOUNTER — Ambulatory Visit (HOSPITAL_COMMUNITY): Admission: RE | Admit: 2016-12-11 | Payer: Self-pay | Source: Ambulatory Visit

## 2016-12-16 ENCOUNTER — Emergency Department (HOSPITAL_COMMUNITY): Payer: Self-pay

## 2016-12-16 ENCOUNTER — Inpatient Hospital Stay (HOSPITAL_COMMUNITY)
Admission: EM | Admit: 2016-12-16 | Discharge: 2016-12-19 | DRG: 054 | Disposition: A | Discharge status: Home Health | Payer: Self-pay | Attending: Internal Medicine | Admitting: Internal Medicine

## 2016-12-16 ENCOUNTER — Other Ambulatory Visit: Payer: Self-pay

## 2016-12-16 ENCOUNTER — Encounter (HOSPITAL_COMMUNITY): Payer: Self-pay | Admitting: *Deleted

## 2016-12-16 DIAGNOSIS — Z7952 Long term (current) use of systemic steroids: Secondary | ICD-10-CM

## 2016-12-16 DIAGNOSIS — D638 Anemia in other chronic diseases classified elsewhere: Secondary | ICD-10-CM | POA: Diagnosis present

## 2016-12-16 DIAGNOSIS — Z7984 Long term (current) use of oral hypoglycemic drugs: Secondary | ICD-10-CM

## 2016-12-16 DIAGNOSIS — C799 Secondary malignant neoplasm of unspecified site: Secondary | ICD-10-CM

## 2016-12-16 DIAGNOSIS — D320 Benign neoplasm of cerebral meninges: Principal | ICD-10-CM | POA: Diagnosis present

## 2016-12-16 DIAGNOSIS — N183 Chronic kidney disease, stage 3 unspecified: Secondary | ICD-10-CM | POA: Diagnosis present

## 2016-12-16 DIAGNOSIS — G935 Compression of brain: Secondary | ICD-10-CM | POA: Diagnosis present

## 2016-12-16 DIAGNOSIS — C3410 Malignant neoplasm of upper lobe, unspecified bronchus or lung: Secondary | ICD-10-CM | POA: Diagnosis present

## 2016-12-16 DIAGNOSIS — Z888 Allergy status to other drugs, medicaments and biological substances status: Secondary | ICD-10-CM

## 2016-12-16 DIAGNOSIS — Z833 Family history of diabetes mellitus: Secondary | ICD-10-CM

## 2016-12-16 DIAGNOSIS — Z886 Allergy status to analgesic agent status: Secondary | ICD-10-CM

## 2016-12-16 DIAGNOSIS — G8194 Hemiplegia, unspecified affecting left nondominant side: Secondary | ICD-10-CM | POA: Diagnosis present

## 2016-12-16 DIAGNOSIS — I129 Hypertensive chronic kidney disease with stage 1 through stage 4 chronic kidney disease, or unspecified chronic kidney disease: Secondary | ICD-10-CM | POA: Diagnosis present

## 2016-12-16 DIAGNOSIS — E669 Obesity, unspecified: Secondary | ICD-10-CM | POA: Diagnosis present

## 2016-12-16 DIAGNOSIS — Z87891 Personal history of nicotine dependence: Secondary | ICD-10-CM

## 2016-12-16 DIAGNOSIS — R29810 Facial weakness: Secondary | ICD-10-CM | POA: Diagnosis present

## 2016-12-16 DIAGNOSIS — Z794 Long term (current) use of insulin: Secondary | ICD-10-CM

## 2016-12-16 DIAGNOSIS — Z6830 Body mass index (BMI) 30.0-30.9, adult: Secondary | ICD-10-CM

## 2016-12-16 DIAGNOSIS — E1122 Type 2 diabetes mellitus with diabetic chronic kidney disease: Secondary | ICD-10-CM | POA: Diagnosis present

## 2016-12-16 DIAGNOSIS — D496 Neoplasm of unspecified behavior of brain: Secondary | ICD-10-CM

## 2016-12-16 DIAGNOSIS — E1121 Type 2 diabetes mellitus with diabetic nephropathy: Secondary | ICD-10-CM | POA: Diagnosis present

## 2016-12-16 DIAGNOSIS — Z79899 Other long term (current) drug therapy: Secondary | ICD-10-CM

## 2016-12-16 DIAGNOSIS — Z809 Family history of malignant neoplasm, unspecified: Secondary | ICD-10-CM

## 2016-12-16 DIAGNOSIS — G936 Cerebral edema: Secondary | ICD-10-CM | POA: Diagnosis present

## 2016-12-16 DIAGNOSIS — C3412 Malignant neoplasm of upper lobe, left bronchus or lung: Secondary | ICD-10-CM | POA: Diagnosis present

## 2016-12-16 DIAGNOSIS — D649 Anemia, unspecified: Secondary | ICD-10-CM | POA: Diagnosis present

## 2016-12-16 DIAGNOSIS — D329 Benign neoplasm of meninges, unspecified: Secondary | ICD-10-CM

## 2016-12-16 DIAGNOSIS — Z90710 Acquired absence of both cervix and uterus: Secondary | ICD-10-CM

## 2016-12-16 DIAGNOSIS — E1169 Type 2 diabetes mellitus with other specified complication: Secondary | ICD-10-CM | POA: Diagnosis present

## 2016-12-16 HISTORY — DX: Chronic kidney disease, stage 3 (moderate): N18.3

## 2016-12-16 HISTORY — DX: Chronic kidney disease, stage 3 unspecified: N18.30

## 2016-12-16 LAB — CBC
HEMATOCRIT: 37 % (ref 36.0–46.0)
Hemoglobin: 10.9 g/dL — ABNORMAL LOW (ref 12.0–15.0)
MCH: 20.7 pg — ABNORMAL LOW (ref 26.0–34.0)
MCHC: 29.5 g/dL — ABNORMAL LOW (ref 30.0–36.0)
MCV: 70.2 fL — ABNORMAL LOW (ref 78.0–100.0)
Platelets: 311 10*3/uL (ref 150–400)
RBC: 5.27 MIL/uL — ABNORMAL HIGH (ref 3.87–5.11)
RDW: 20.8 % — AB (ref 11.5–15.5)
WBC: 10.5 10*3/uL (ref 4.0–10.5)

## 2016-12-16 LAB — BASIC METABOLIC PANEL
ANION GAP: 7 (ref 5–15)
BUN: 22 mg/dL — AB (ref 6–20)
CALCIUM: 10.1 mg/dL (ref 8.9–10.3)
CO2: 29 mmol/L (ref 22–32)
Chloride: 101 mmol/L (ref 101–111)
Creatinine, Ser: 1.26 mg/dL — ABNORMAL HIGH (ref 0.44–1.00)
GFR calc Af Amer: 51 mL/min — ABNORMAL LOW (ref >60)
GFR, EST NON AFRICAN AMERICAN: 44 mL/min — AB (ref >60)
Glucose, Bld: 168 mg/dL — ABNORMAL HIGH (ref 65–99)
POTASSIUM: 5.1 mmol/L (ref 3.5–5.1)
SODIUM: 137 mmol/L (ref 135–145)

## 2016-12-16 LAB — CBG MONITORING, ED: GLUCOSE-CAPILLARY: 159 mg/dL — AB (ref 65–99)

## 2016-12-16 MED ORDER — INSULIN ASPART 100 UNIT/ML ~~LOC~~ SOLN
0.0000 [IU] | Freq: Three times a day (TID) | SUBCUTANEOUS | Status: DC
  Administered 2016-12-17: 3 [IU] via SUBCUTANEOUS
  Administered 2016-12-17: 8 [IU] via SUBCUTANEOUS
  Administered 2016-12-17 – 2016-12-18 (×2): 5 [IU] via SUBCUTANEOUS
  Administered 2016-12-18 – 2016-12-19 (×3): 3 [IU] via SUBCUTANEOUS
  Administered 2016-12-19: 5 [IU] via SUBCUTANEOUS

## 2016-12-16 MED ORDER — DEXAMETHASONE SODIUM PHOSPHATE 4 MG/ML IJ SOLN
10.0000 mg | Freq: Once | INTRAMUSCULAR | Status: AC
  Administered 2016-12-16: 10 mg via INTRAVENOUS
  Filled 2016-12-16: qty 3

## 2016-12-16 MED ORDER — GADOBENATE DIMEGLUMINE 529 MG/ML IV SOLN
14.0000 mL | Freq: Once | INTRAVENOUS | Status: AC | PRN
  Administered 2016-12-16: 14 mL via INTRAVENOUS

## 2016-12-16 MED ORDER — DEXAMETHASONE SODIUM PHOSPHATE 4 MG/ML IJ SOLN
4.0000 mg | Freq: Two times a day (BID) | INTRAMUSCULAR | Status: DC
  Administered 2016-12-17 – 2016-12-19 (×5): 4 mg via INTRAVENOUS
  Filled 2016-12-16 (×5): qty 1

## 2016-12-16 MED ORDER — MORPHINE SULFATE (PF) 4 MG/ML IV SOLN
4.0000 mg | Freq: Once | INTRAVENOUS | Status: AC
  Administered 2016-12-16: 4 mg via INTRAVENOUS
  Filled 2016-12-16: qty 1

## 2016-12-16 NOTE — ED Provider Notes (Signed)
[facility] DEPT Provider Note   CSN: [id number] Arrival date & time: 12/16/16  1752     History   Chief Complaint Chief Complaint  Patient presents with  . Weakness    HPI Susan Mahoney is a 66 y.o. female.  HPI Patient presents with left-sided weakness. Has been worse today reportedly going on for last 1-3 weeks. History of metastatic lung cancer. Previously not known to be in her brain. Has been on chemotherapy for it. Most recent chemotherapy stopped due to some weakness. MRI been ordered but not done yet. Reportedly was some issues with insurance paying for it. No headache. Has had some mild confusion with that. That too weak to be able to live at home also. Has reportedly not been able to walk and can minimally raise her left arm. Past Medical History:  Diagnosis Date  . Anemia   . Diabetes mellitus, type 2 ([place])   . Hypertension   . Low vitamin B12 level 02/28/2016  . Obesity   . Pancoast tumor of left lung ([place]) 03/03/2016  . Sinus infection   . Tobacco abuse     Patient Active Problem List   Diagnosis Date Noted  . Vaginal irritation 12/10/2016  . Vitiligo 12/10/2016  . Vaginal atrophy 12/10/2016  . Loss of appetite 06/10/2016  . Pancoast tumor of left lung ([place]) 03/03/2016  . Low vitamin B12 level 02/28/2016  . CKD (chronic kidney disease) stage 3, GFR 30-59 ml/min 02/19/2016  . Symptomatic anemia   . Reflux esophagitis   . Hiatal hernia   . Schatzki's ring   . History of colonic polyps   . Diverticulosis of colon without hemorrhage   . Anemia 02/17/2016  . Lung mass 02/17/2016  . Angioedema 09/02/2013  . Essential hypertension 09/02/2013  . Diabetes mellitus type 2 in obese ([place]) 09/02/2013  . Morbid obesity ([place]) 09/02/2013  . Tobacco abuse 09/02/2013  . Bradycardia 09/02/2013    Past Surgical History:  Procedure Laterality Date  . ABDOMINAL HYSTERECTOMY    . COLONOSCOPY N/A 02/18/2016   Procedure: COLONOSCOPY;  Surgeon: Daneil Dolin, MD;   Location: [facility];  Service: Endoscopy;  Laterality: N/A;  . ESOPHAGOGASTRODUODENOSCOPY N/A 02/18/2016   Procedure: ESOPHAGOGASTRODUODENOSCOPY (EGD);  Surgeon: Daneil Dolin, MD;  Location: [facility] ENDO SUITE;  Service: Endoscopy;  Laterality: N/A;    OB History    Gravida Para Term Preterm AB Living   '1 1 1     1   '$ SAB TAB Ectopic Multiple Live Births           1       Home Medications    Prior to Admission medications   Medication Sig Start Date End Date Taking? Authorizing Provider  albuterol (PROVENTIL HFA;VENTOLIN HFA) 108 (90 BASE) MCG/ACT inhaler Inhale 1-2 puffs into the lungs every 6 (six) hours as needed for wheezing or shortness of breath. 09/07/13  Yes Leonard Schwartz, MD  Cholecalciferol (VITAMIN D PO) Take 1 tablet by mouth daily. Reported on 03/31/2016   Yes Historical Provider, MD  conjugated estrogens (PREMARIN) vaginal cream Use .5 gm on vagina area daily for 2 weeks then 2-3 x daily Patient taking differently: Place 1 Applicatorful vaginally 2 (two) times a week.  12/10/16  Yes Estill Dooms, NP  ferrous gluconate (FERGON) 324 MG tablet Take 1 tablet (324 mg total) by mouth 2 (two) times daily with a meal. 02/20/16  Yes Kathie Dike, MD  glipiZIDE (GLUCOTROL) 10 MG tablet Take 5 mg  by mouth daily after supper.   Yes Historical Provider, MD  HYDROcodone-acetaminophen (NORCO) 5-325 MG tablet Take 1-2 tablets by mouth every 4 (four) hours as needed for moderate pain. 12/01/16  Yes Baird Cancer, PA-C  lidocaine-prilocaine (EMLA) cream Apply a quarter size amount to port site 1 hour prior to chemo. Do not rub in. Cover with plastic wrap. 03/28/16  Yes Patrici Ranks, MD  magic mouthwash w/lidocaine SOLN Equal parts of: Benadryl 12.'5mg'$ /58m, Viscous lidocaine 2%, and Maalox. Swish and swallow 5 mL four times a day. Patient taking differently: Take 5 mLs by mouth 4 (four) times daily as needed for mouth pain. Equal parts of: Benadryl 12.'5mg'$ /541m Viscous lidocaine 2%, and  Maalox. Swish and swallow 5 mL four times a day. 08/28/16  Yes ShPatrici RanksMD  megestrol (MEGACE) 400 MG/10ML suspension Take 10 mLs (400 mg total) by mouth daily. Patient taking differently: Take 400 mg by mouth daily as needed (for appetie).  06/10/16  Yes ShPatrici RanksMD  morphine (MS CONTIN) 15 MG 12 hr tablet Take 1 tablet (15 mg total) by mouth every 12 (twelve) hours. Patient taking differently: Take 15 mg by mouth every 12 (twelve) hours as needed for pain.  11/24/16  Yes LoTwana FirstMD  Nivolumab (OPDIVO IV) Inject into the vein every 14 (fourteen) days.    Yes Historical Provider, MD  ondansetron (ZOFRAN) 8 MG tablet Take 1 tablet (8 mg total) by mouth every 8 (eight) hours as needed for nausea or vomiting. 03/28/16  Yes ShPatrici RanksMD  potassium chloride SA (K-DUR,KLOR-CON) 20 MEQ tablet Take 1 tablet (20 mEq total) by mouth 2 (two) times daily. 10/13/16  Yes ThManon Hildingefalas, PA-C  predniSONE (DELTASONE) 10 MG tablet Take at the onset of diarrhea.  Take 7 tablets (70 mg) at one time.  Then call the [facility]Patient taking differently: Take 70 mg by mouth 3 times/day as needed-between meals & bedtime. Take at the onset of diarrhea.  Take 7 tablets (70 mg) at one time.  Then call the [facility]11/3/17  Yes ShPatrici RanksMD  prochlorperazine (COMPAZINE) 10 MG tablet Take 1 tablet (10 mg total) by mouth every 6 (six) hours as needed for nausea or vomiting. 03/28/16  Yes ShPatrici RanksMD    Family History Family History  Problem Relation Age of Onset  . Cancer Father   . Diabetes Maternal Grandmother   . Arthritis Sister   . Diabetes Sister   . Arthritis Sister   . Diabetes Sister   . Arthritis Sister   . Diabetes Sister   . Arthritis Sister   . Diabetes Sister   . Arthritis Sister   . Diabetes Sister     Social History Social History  Substance Use Topics  . Smoking status: Former Smoker    Packs/day: 0.50    Years: 30.00    Types:  Cigarettes    Quit date: 03/13/2016  . Smokeless tobacco: Never Used  . Alcohol use No     Allergies   Ace inhibitors and Indomethacin   Review of Systems Review of Systems  Constitutional: Positive for appetite change.  HENT: Negative for congestion.   Respiratory: Negative for shortness of breath.   Cardiovascular: Negative for chest pain.  Gastrointestinal: Negative for abdominal distention.  Genitourinary: Negative for flank pain.  Musculoskeletal: Negative for back pain.  Neurological: Positive for weakness. Negative for headaches.  Psychiatric/Behavioral: Positive for confusion.  Physical Exam Updated Vital Signs BP 137/77   Pulse 86   Resp 15   Ht '4\' 11"'$  (1.499 m)   Wt 151 lb (68.5 kg)   SpO2 98%   BMI 30.50 kg/m   Physical Exam  Constitutional: She appears well-developed.  HENT:  Left-sided facial droop.  Eyes: EOM are normal. Pupils are equal, round, and reactive to light.  Pulmonary/Chest: Effort normal.  Abdominal: Soft.  Musculoskeletal: She exhibits no edema.  Neurological: She is alert.  Patient is minimally able to raise her left  upper extremity. decreased straight leg raise strength on left side compared to right. Patient is awake and appropriate.   Skin: Skin is warm. Capillary refill takes less than 2 seconds.     ED Treatments / Results  Labs (all labs ordered are listed, but only abnormal results are displayed) Labs Reviewed  BASIC METABOLIC PANEL - Abnormal; Notable for the following:       Result Value   Glucose, Bld 168 (*)    BUN 22 (*)    Creatinine, Ser 1.26 (*)    GFR calc non Af Amer 44 (*)    GFR calc Af Amer 51 (*)    All other components within normal limits  CBC - Abnormal; Notable for the following:    RBC 5.27 (*)    Hemoglobin 10.9 (*)    MCV 70.2 (*)    MCH 20.7 (*)    MCHC 29.5 (*)    RDW 20.8 (*)    All other components within normal limits  CBG MONITORING, ED - Abnormal; Notable for the following:     Glucose-Capillary 159 (*)    All other components within normal limits  URINALYSIS, ROUTINE W REFLEX MICROSCOPIC  I-STAT CHEM 8, ED    EKG  EKG Interpretation None       Radiology Ct Head Wo Contrast  Result Date: 12/16/2016 CLINICAL DATA:  Brain tumor EXAM: CT HEAD WITHOUT CONTRAST TECHNIQUE: Contiguous axial images were obtained from the base of the skull through the vertex without intravenous contrast. COMPARISON:  MRI head 12/16/2016 FINDINGS: Brain: Large mass in the right frontal lobe measures 5.2 x 5.1 cm. The mass is hyperdense to the brain. There is some mild cystic degeneration within the mass. No significant calcification. The mass appears be attached to the falx. Large amount of vasogenic edema in the adjacent white matter. Mass-effect on the frontal horns. There is subfalcine herniation measuring approximately 12 mm unchanged from the earlier today. Negative for hydrocephalus. No acute hemorrhage. Negative for acute infarction. Vascular: No hyperdense vessel or unexpected calcification. Skull: No significant skull abnormality. No hyperostosis of the right frontal bone. Sinuses/Orbits: Negative Other: None IMPRESSION: Large hyperdense right parafalcine mass lesion compatible with meningioma. Large amount of vasogenic edema with mass-effect and subfalcine herniation measuring 12 mm. Electronically Signed   By: Franchot Gallo M.D.   On: 12/16/2016 20:08   Mr Brain W And Wo Contrast  Result Date: 12/16/2016 CLINICAL DATA:  LEFT-sided weakness and confusion for 3 weeks. History of metastatic lung cancer, hypertension and diabetes. EXAM: MRI HEAD WITHOUT AND WITH CONTRAST TECHNIQUE: Multiplanar, multiecho pulse sequences of the brain and surrounding structures were obtained without and with intravenous contrast. CONTRAST:  63m MULTIHANCE GADOBENATE DIMEGLUMINE 529 MG/ML IV SOLN COMPARISON:  None. FINDINGS: INTRACRANIAL CONTENTS: 5.3 x 5.8 x 5.3 cm (transverse by AP by CC) low T1, low T2  RIGHT frontal and avidly enhancing mass with tiny flow voids along the inferior margin. Mass  demonstrates reduced diffusion and decreased ADC values. No reduced diffusion to suggest acute ischemia. Broad dural contact, contiguous with RIGHT superior sagittal sinus. Surrounding cerebral spinal fluid cleft sign. Surrounding T2 bright vasogenic edema extending to the posterior RIGHT frontal lobe. Mass-effect on RIGHT frontal horn of the lateral ventricle without hydrocephalus or entrapment. 9 mm RIGHT to LEFT subfalcine herniation. No satellite areas of abnormal enhancement. No abnormal extra-axial fluid collections. VASCULAR: Normal major intracranial vascular flow voids present at skull base. SKULL AND UPPER CERVICAL SPINE: No abnormal sellar expansion. No suspicious calvarial bone marrow signal. Craniocervical junction maintained. SINUSES/ORBITS: The mastoid air-cells and included paranasal sinuses are well-aerated.The included ocular globes and orbital contents are non-suspicious. OTHER: RIGHT suboccipital sebaceous cyst. IMPRESSION: A 5.3 x 5.8 x 5.3 cm RIGHT frontal mass with imaging characteristics of a meningioma, resulting in 9 mm RIGHT to LEFT subfalcine herniation with extensive vasogenic edema. Recommend noncontrast CT HEAD to assess for calcifications and periosteum reaction. Masses unlikely to represent glioma or metastasis. Acute findings discussed with and reconfirmed by Dr.Giacomo Valone on 12/16/2016 at 7:07 pm. Electronically Signed   By: Elon Alas M.D.   On: 12/16/2016 19:08    Procedures Procedures (including critical care time)  Medications Ordered in ED Medications  gadobenate dimeglumine (MULTIHANCE) injection 14 mL (14 mLs Intravenous Contrast Given 12/16/16 1846)  morphine 4 MG/ML injection 4 mg (4 mg Intravenous Given 12/16/16 2008)     Initial Impression / Assessment and Plan / ED Course  I have reviewed the triage vital signs and the nursing notes.  Pertinent labs &  imaging results that were available during my care of the patient were reviewed by me and considered in my medical decision making (see chart for details).     Patient with left-sided weakness. Has been going on for while. Has metastatic cancer but it appears that the mass in her brain is likely meningioma. Discussed over the phone with Dr. Cyndy Freeze from neurosurgery. States is likely nonoperative and will need to follow-up as an outpatient in the office. However with the worsening weakness and deficits at think it is reasonable to admit to the hospital for IV steroids physical therapy and further treatment.  Final Clinical Impressions(s) / ED Diagnoses   Final diagnoses:  Meningioma ([place])  Metastatic cancer [facility])    New Prescriptions New Prescriptions   No medications on file     Davonna Belling, MD 12/16/16 2054

## 2016-12-16 NOTE — ED Notes (Signed)
MD Lorin Mercy at bedside.

## 2016-12-16 NOTE — ED Triage Notes (Signed)
Pt comes in with family for left sided weakness. Per daughter, this started 3 weeks ago but got worse today. Pt is currently getting treatment in the cancer center for lung cancer.

## 2016-12-16 NOTE — ED Notes (Signed)
Unable to obtain VS due to pt being in MRI.

## 2016-12-16 NOTE — H&P (Signed)
History and Physical    [name]:[id number] DOB: 11-26-1950 DOA: 12/16/2016  PCP: [name] PA-C Consultants:  Talbert Cage - oncology; nephrology in [place]; [facility] Patient coming from: home - lives with husband; NOK: husband, [phone], [phone]  Chief Complaint:  Left-sided weakness  HPI: Susan Mahoney is a 66 y.o. female with medical history significant of stage IV pancoast tumor/lung cancer, DM, HTN, CKD stage 3 presenting with left-sided weakness.  Patient started having left-sided weakness about 1-2 weeks ago.  Losing movement, getting worse and worse.  Husband mentioned it to the people upstairs at the [facility] and they recommended an MRI - it was STAT but scheduled for 2 days later and then it was snowing and so unable to come.  Then they were called and told they would have to bring $100 to have the scan.  She was getting worse and so he brought her to the ER today.  Now unable to move left arm and leg.  Some left facial droop.  Some confusion, difficulty keeping up with times and dates.  Requires prompting.  Completely dependent on ADLs at this time.   ED Course: CT/MRI confirm brain tumor (?meningioma unrelated to her stage IV lung cancer) with extensive vasogenic edema and subfalcine herniation.  Given Decadron 10 mg IV x 1.  Consulted with Dr. Christella Noa and Dr. Julien Nordmann.  Review of Systems: As per HPI; otherwise 10 point review of systems reviewed and negative.   Ambulatory Status:  Last able to walk independently about a week ago - since then intermittently able to do some ADLs and progressively worse  Past Medical History:  Diagnosis Date  . Anemia   . Chronic kidney disease (CKD), stage III (moderate)   . Diabetes mellitus, type 2 ([place])   . Hypertension   . Low vitamin B12 level 02/28/2016  . Obesity   . Pancoast tumor of left lung ([place]) 03/03/2016  . Sinus infection   . Tobacco abuse     Past Surgical History:  Procedure  Laterality Date  . ABDOMINAL HYSTERECTOMY    . COLONOSCOPY N/A 02/18/2016   Procedure: COLONOSCOPY;  Surgeon: Daneil Dolin, MD;  Location: [facility];  Service: Endoscopy;  Laterality: N/A;  . ESOPHAGOGASTRODUODENOSCOPY N/A 02/18/2016   Procedure: ESOPHAGOGASTRODUODENOSCOPY (EGD);  Surgeon: Daneil Dolin, MD;  Location: [facility];  Service: Endoscopy;  Laterality: N/A;    Social History   Social History  . Marital status: Married    Spouse name: N/A  . Number of children: N/A  . Years of education: N/A   Occupational History  . Not on file.   Social History Main Topics  . Smoking status: Former Smoker    Packs/day: 0.50    Years: 30.00    Types: Cigarettes    Quit date: 03/13/2016  . Smokeless tobacco: Never Used  . Alcohol use No  . Drug use: No  . Sexual activity: No     Comment: hyst   Other Topics Concern  . Not on file   Social History Narrative  . No narrative on file    Allergies  Allergen Reactions  . Ace Inhibitors Swelling  . Indomethacin     Swelling     Family History  Problem Relation Age of Onset  . Cancer Father   . Diabetes Maternal Grandmother   . Arthritis Sister   . Diabetes Sister   . Arthritis Sister   . Diabetes Sister   . Arthritis  Sister   . Diabetes Sister   . Arthritis Sister   . Diabetes Sister   . Arthritis Sister   . Diabetes Sister     Prior to Admission medications   Medication Sig Start Date End Date Taking? Authorizing Provider  albuterol (PROVENTIL HFA;VENTOLIN HFA) 108 (90 BASE) MCG/ACT inhaler Inhale 1-2 puffs into the lungs every 6 (six) hours as needed for wheezing or shortness of breath. 09/07/13  Yes Leonard Schwartz, MD  Cholecalciferol (VITAMIN D PO) Take 1 tablet by mouth daily. Reported on 03/31/2016   Yes Historical Provider, MD  conjugated estrogens (PREMARIN) vaginal cream Use .5 gm on vagina area daily for 2 weeks then 2-3 x daily Patient taking differently: Place 1 Applicatorful vaginally 2 (two)  times a week.  12/10/16  Yes Estill Dooms, NP  ferrous gluconate (FERGON) 324 MG tablet Take 1 tablet (324 mg total) by mouth 2 (two) times daily with a meal. 02/20/16  Yes Kathie Dike, MD  glipiZIDE (GLUCOTROL) 10 MG tablet Take 5 mg by mouth daily after supper.   Yes Historical Provider, MD  HYDROcodone-acetaminophen (NORCO) 5-325 MG tablet Take 1-2 tablets by mouth every 4 (four) hours as needed for moderate pain. 12/01/16  Yes Baird Cancer, PA-C  lidocaine-prilocaine (EMLA) cream Apply a quarter size amount to port site 1 hour prior to chemo. Do not rub in. Cover with plastic wrap. 03/28/16  Yes Patrici Ranks, MD  magic mouthwash w/lidocaine SOLN Equal parts of: Benadryl 12.'5mg'$ /7m, Viscous lidocaine 2%, and Maalox. Swish and swallow 5 mL four times a day. Patient taking differently: Take 5 mLs by mouth 4 (four) times daily as needed for mouth pain. Equal parts of: Benadryl 12.'5mg'$ /583m Viscous lidocaine 2%, and Maalox. Swish and swallow 5 mL four times a day. 08/28/16  Yes ShPatrici RanksMD  megestrol (MEGACE) 400 MG/10ML suspension Take 10 mLs (400 mg total) by mouth daily. Patient taking differently: Take 400 mg by mouth daily as needed (for appetie).  06/10/16  Yes ShPatrici RanksMD  morphine (MS CONTIN) 15 MG 12 hr tablet Take 1 tablet (15 mg total) by mouth every 12 (twelve) hours. Patient taking differently: Take 15 mg by mouth every 12 (twelve) hours as needed for pain.  11/24/16  Yes LoTwana FirstMD  Nivolumab (OPDIVO IV) Inject into the vein every 14 (fourteen) days.    Yes Historical Provider, MD  ondansetron (ZOFRAN) 8 MG tablet Take 1 tablet (8 mg total) by mouth every 8 (eight) hours as needed for nausea or vomiting. 03/28/16  Yes ShPatrici RanksMD  potassium chloride SA (K-DUR,KLOR-CON) 20 MEQ tablet Take 1 tablet (20 mEq total) by mouth 2 (two) times daily. 10/13/16  Yes ThManon Hildingefalas, PA-C  predniSONE (DELTASONE) 10 MG tablet Take at the onset of diarrhea.  Take 7  tablets (70 mg) at one time.  Then [place] the [place] taking differently: Take 70 mg by mouth 3 times/day as needed-between meals & bedtime. Take at the onset of diarrhea.  Take 7 tablets (70 mg) at one time.  Then [place] the [place]  Yes ShPatrici RanksMD  prochlorperazine (COMPAZINE) 10 MG tablet Take 1 tablet (10 mg total) by mouth every 6 (six) hours as needed for nausea or vomiting. 03/28/16  Yes ShPatrici RanksMD    Physical Exam: Vitals:   12/16/16 2100 12/16/16 2130 12/16/16 2200 12/16/16 2230  BP: 138/81 (!) 142/84 (!) 142/92 128/84  Pulse: 87  91 (!) 123 (!) 130  Resp: 19 15 (!) 21 14  SpO2: 97% 98% 95% 97%  Weight:      Height:         General:  Appears calm and comfortable and is NAD, flat affect, left-sided facial droop visible at rest Eyes:  PERRL, EOMI, normal lids, iris ENT:  grossly normal hearing, lips & tongue, mmm Neck:  no LAD, masses or thyromegaly Cardiovascular:  Tachycardia, no m/r/g. No LE edema.  Respiratory:  CTA bilaterally, no w/r/r. Normal respiratory effort. Abdomen:  soft, ntnd, NABS Skin:  no rash or induration seen on limited exam Musculoskeletal:  Left arm and leg are flaccid.  She is minimally able to wiggle first 2 fingers and toes on the left. Psychiatric: flat affect and minimal emotions appreciated, speech fluent and appropriate but slow and clearly requires effort, AOx3 Neurologic:  Left tongue deviation, clear left facial palsy, left hemiparesis (near complete)  Labs on Admission: I have personally reviewed following labs and imaging studies  CBC:  Recent Labs Lab 12/16/16 1801  WBC 10.5  HGB 10.9*  HCT 37.0  MCV 70.2*  PLT 619   Basic Metabolic Panel:  Recent Labs Lab 12/16/16 1801  NA 137  K 5.1  CL 101  CO2 29  GLUCOSE 168*  BUN 22*  CREATININE 1.26*  CALCIUM 10.1   GFR: Estimated Creatinine Clearance: 38 mL/min (A) (by C-G formula based on SCr of 1.26 mg/dL (H)). Liver Function  Tests: No results for input(s): AST, ALT, ALKPHOS, BILITOT, PROT, ALBUMIN in the last 168 hours. No results for input(s): LIPASE, AMYLASE in the last 168 hours. No results for input(s): AMMONIA in the last 168 hours. Coagulation Profile: No results for input(s): INR, PROTIME in the last 168 hours. Cardiac Enzymes: No results for input(s): CKTOTAL, CKMB, CKMBINDEX, TROPONINI in the last 168 hours. BNP (last 3 results) No results for input(s): PROBNP in the last 8760 hours. HbA1C: No results for input(s): HGBA1C in the last 72 hours. CBG:  Recent Labs Lab 12/16/16 1808  GLUCAP 159*   Lipid Profile: No results for input(s): CHOL, HDL, LDLCALC, TRIG, CHOLHDL, LDLDIRECT in the last 72 hours. Thyroid Function Tests: No results for input(s): TSH, T4TOTAL, FREET4, T3FREE, THYROIDAB in the last 72 hours. Anemia Panel: No results for input(s): VITAMINB12, FOLATE, FERRITIN, TIBC, IRON, RETICCTPCT in the last 72 hours. Urine analysis: No results found for: COLORURINE, APPEARANCEUR, LABSPEC, PHURINE, GLUCOSEU, HGBUR, BILIRUBINUR, KETONESUR, PROTEINUR, UROBILINOGEN, NITRITE, LEUKOCYTESUR  Creatinine Clearance: Estimated Creatinine Clearance: 38 mL/min (A) (by C-G formula based on SCr of 1.26 mg/dL (H)).  Sepsis Labs: '@LABRCNTIP'$ (procalcitonin:4,lacticidven:4) )No results found for this or any previous visit (from the past 240 hour(s)).   Radiological Exams on Admission: Ct Head Wo Contrast  Result Date: 12/16/2016 CLINICAL DATA:  Brain tumor EXAM: CT HEAD WITHOUT CONTRAST TECHNIQUE: Contiguous axial images were obtained from the base of the skull through the vertex without intravenous contrast. COMPARISON:  MRI head 12/16/2016 FINDINGS: Brain: Large mass in the right frontal lobe measures 5.2 x 5.1 cm. The mass is hyperdense to the brain. There is some mild cystic degeneration within the mass. No significant calcification. The mass appears be attached to the falx. Large amount of vasogenic  edema in the adjacent white matter. Mass-effect on the frontal horns. There is subfalcine herniation measuring approximately 12 mm unchanged from the earlier today. Negative for hydrocephalus. No acute hemorrhage. Negative for acute infarction. Vascular: No hyperdense vessel or unexpected calcification. Skull: No significant skull  abnormality. No hyperostosis of the right frontal bone. Sinuses/Orbits: Negative Other: None IMPRESSION: Large hyperdense right parafalcine mass lesion compatible with meningioma. Large amount of vasogenic edema with mass-effect and subfalcine herniation measuring 12 mm. Electronically Signed   By: Franchot Gallo M.D.   On: 12/16/2016 20:08   Mr Brain W And Wo Contrast  Result Date: 12/16/2016 CLINICAL DATA:  LEFT-sided weakness and confusion for 3 weeks. History of metastatic lung cancer, hypertension and diabetes. EXAM: MRI HEAD WITHOUT AND WITH CONTRAST TECHNIQUE: Multiplanar, multiecho pulse sequences of the brain and surrounding structures were obtained without and with intravenous contrast. CONTRAST:  67m MULTIHANCE GADOBENATE DIMEGLUMINE 529 MG/ML IV SOLN COMPARISON:  None. FINDINGS: INTRACRANIAL CONTENTS: 5.3 x 5.8 x 5.3 cm (transverse by AP by CC) low T1, low T2 RIGHT frontal and avidly enhancing mass with tiny flow voids along the inferior margin. Mass demonstrates reduced diffusion and decreased ADC values. No reduced diffusion to suggest acute ischemia. Broad dural contact, contiguous with RIGHT superior sagittal sinus. Surrounding cerebral spinal fluid cleft sign. Surrounding T2 bright vasogenic edema extending to the posterior RIGHT frontal lobe. Mass-effect on RIGHT frontal horn of the lateral ventricle without hydrocephalus or entrapment. 9 mm RIGHT to LEFT subfalcine herniation. No satellite areas of abnormal enhancement. No abnormal extra-axial fluid collections. VASCULAR: Normal major intracranial vascular flow voids present at skull base. SKULL AND UPPER CERVICAL  SPINE: No abnormal sellar expansion. No suspicious calvarial bone marrow signal. Craniocervical junction maintained. SINUSES/ORBITS: The mastoid air-cells and included paranasal sinuses are well-aerated.The included ocular globes and orbital contents are non-suspicious. OTHER: RIGHT suboccipital sebaceous cyst. IMPRESSION: A 5.3 x 5.8 x 5.3 cm RIGHT frontal mass with imaging characteristics of a meningioma, resulting in 9 mm RIGHT to LEFT subfalcine herniation with extensive vasogenic edema. Recommend noncontrast CT HEAD to assess for calcifications and periosteum reaction. Masses unlikely to represent glioma or metastasis. Acute findings discussed with and reconfirmed by Dr.NATHAN PICKERING on 12/16/2016 at 7:07 pm. Electronically Signed   By: CElon AlasM.D.   On: 12/16/2016 19:08    EKG: Independently reviewed.  NSR with rate 90; nonspecific ST changes with no evidence of acute ischemia  Assessment/Plan Principal Problem:   Neoplasm of brain causing mass effect on adjacent structures ([facility] Active Problems:   Diabetes mellitus type 2 in obese (HCC)   Anemia   CKD (chronic kidney disease) stage 3, GFR 30-59 ml/min   Pancoast tumor of left lung (HCC)   Vasogenic brain edema (HCC)   Brain mass with vasogenic edema and subfalcine herniation -This is a large tumor, 5x5x5 -The patient complained of symptoms at a recent visit and was sent for MRI but unfortunately encountered a number of issues in getting this done -I spoke at length with Dr. CChristella Noatonight regarding this patient; he was fantastic and extremely helpful and I sincerely appreciate it -The MRI suggests that the brain mass may be a meningioma; it is frankly somewhat difficult for me to digest the idea that this large brain mass which is clearly acting malignant is in fact not a malignancy, particularly in light of her underlying stage IV known cancer -Regardless, with her underlying stage IV cancer, she is likely not a surgical  candidate -She is, however, entirely debilitated from this brain tumor -She may have some or even a good response to Decadron therapy and regain some of her ability to perform ADLs -The patient and her family also need to hear the very frank truth from the specialists who are  able to intervene or not able to intervene so that they can understand the severity of this condition -No Lovenox for now due to uncertainty about procedures as well as concern for bleeding risk -NPO overnight in case of need for procedure (albeit unlikely) -Pain control with IV morphine; of note, she appears to have been taking her MS Contin on a prn basis at home, and this is not the ideal dosing of this medication -PT/OT consults requested -She had mild tachycardia at the time of my evaluation and so it is reasonable to monitor her on telemetry for now  Lung cancer -It is not clear in my review of the oncology records what this patient's prognosis is -She does appear to have responded to the [place] therapy by her 2/1 scans and so the cancer is not growing and may be slightly smaller -The family was unaware that she has progressed to Stage IV and were quite surprised by this (although it appears that this has been the case since September) -They are fairly unrealistic about her prognosis accordingly, and are likely to need significant education about the severity of her condition and her long-term prognosis -She seems likely to benefit from oncology input during this hospitalization, although this is not the current priority -She may also benefit from palliative care consultation, although this was not discussed at the time of the admission  DM -Glucose 168 -She is taking Glucotrol, will hold -Cover with SSI; she is likely to have worse glycemic control with the steroids  CKD -Creatinine 1.26, appears to be at/near baseline  Anemia -Hgb 10.9, improved  DVT prophylaxis: SCDs Code Status: Full - confirmed with  patient/family.  I have encouraged them to seriously think about this. Family Communication: Husband and another family member (daughter?) were present throughout the evaluation and discussion Disposition Plan: To be determined Consults called: Oncology by telephone; Neurosurgery to see patient upon arrival; PT/OT Admission status: Admit - It is my clinical opinion that admission to INPATIENT is reasonable and necessary because this patient will require at least 2 midnights in the hospital to treat this condition based on the medical complexity of the problems presented.  Given the aforementioned information, the predictability of an adverse outcome is felt to be significant.    Karmen Bongo MD [facility]  If 7PM-7AM, please contact night-coverage [email or web address] Password [facility]  12/16/2016, 11:02 PM

## 2016-12-17 ENCOUNTER — Telehealth (HOSPITAL_COMMUNITY): Payer: Self-pay | Admitting: Oncology

## 2016-12-17 ENCOUNTER — Other Ambulatory Visit (HOSPITAL_COMMUNITY): Payer: Self-pay | Admitting: Oncology

## 2016-12-17 DIAGNOSIS — G936 Cerebral edema: Secondary | ICD-10-CM

## 2016-12-17 DIAGNOSIS — C3412 Malignant neoplasm of upper lobe, left bronchus or lung: Secondary | ICD-10-CM

## 2016-12-17 DIAGNOSIS — N183 Chronic kidney disease, stage 3 (moderate): Secondary | ICD-10-CM

## 2016-12-17 LAB — BASIC METABOLIC PANEL
ANION GAP: 10 (ref 5–15)
BUN: 19 mg/dL (ref 6–20)
CALCIUM: 10.3 mg/dL (ref 8.9–10.3)
CO2: 25 mmol/L (ref 22–32)
Chloride: 104 mmol/L (ref 101–111)
Creatinine, Ser: 1.07 mg/dL — ABNORMAL HIGH (ref 0.44–1.00)
GFR calc Af Amer: >60 mL/min (ref >60)
GFR, EST NON AFRICAN AMERICAN: 54 mL/min — AB (ref >60)
GLUCOSE: 206 mg/dL — AB (ref 65–99)
POTASSIUM: 4.9 mmol/L (ref 3.5–5.1)
Sodium: 139 mmol/L (ref 135–145)

## 2016-12-17 LAB — GLUCOSE, CAPILLARY
GLUCOSE-CAPILLARY: 217 mg/dL — AB (ref 65–99)
Glucose-Capillary: 192 mg/dL — ABNORMAL HIGH (ref 65–99)
Glucose-Capillary: 298 mg/dL — ABNORMAL HIGH (ref 65–99)
Glucose-Capillary: 232 mg/dL — ABNORMAL HIGH (ref 65–99)
Glucose-Capillary: 180 mg/dL — ABNORMAL HIGH (ref 65–99)

## 2016-12-17 LAB — CBC
HCT: 33.8 % — ABNORMAL LOW (ref 36.0–46.0)
HEMOGLOBIN: 10 g/dL — AB (ref 12.0–15.0)
MCH: 20.3 pg — ABNORMAL LOW (ref 26.0–34.0)
MCHC: 29.6 g/dL — AB (ref 30.0–36.0)
MCV: 68.7 fL — ABNORMAL LOW (ref 78.0–100.0)
Platelets: 247 10*3/uL (ref 150–400)
RBC: 4.92 MIL/uL (ref 3.87–5.11)
RDW: 20.8 % — AB (ref 11.5–15.5)
WBC: 6.9 10*3/uL (ref 4.0–10.5)

## 2016-12-17 MED ORDER — INSULIN ASPART 100 UNIT/ML ~~LOC~~ SOLN
5.0000 [IU] | Freq: Three times a day (TID) | SUBCUTANEOUS | Status: DC
  Administered 2016-12-17 – 2016-12-19 (×5): 5 [IU] via SUBCUTANEOUS

## 2016-12-17 MED ORDER — LACTATED RINGERS IV SOLN
INTRAVENOUS | Status: DC
  Administered 2016-12-17 – 2016-12-18 (×3): via INTRAVENOUS

## 2016-12-17 MED ORDER — ACETAMINOPHEN 325 MG PO TABS
650.0000 mg | ORAL_TABLET | Freq: Four times a day (QID) | ORAL | Status: DC | PRN
  Administered 2016-12-18: 650 mg via ORAL
  Filled 2016-12-17: qty 2

## 2016-12-17 MED ORDER — SODIUM CHLORIDE 0.9% FLUSH
3.0000 mL | Freq: Two times a day (BID) | INTRAVENOUS | Status: DC
  Administered 2016-12-17 – 2016-12-19 (×3): 3 mL via INTRAVENOUS

## 2016-12-17 MED ORDER — ACETAMINOPHEN 650 MG RE SUPP: 650.0000 mg | Freq: Four times a day (QID) | RECTAL | Status: DC | PRN

## 2016-12-17 MED ORDER — ONDANSETRON HCL 4 MG PO TABS: 4.0000 mg | ORAL_TABLET | Freq: Four times a day (QID) | ORAL | Status: DC | PRN

## 2016-12-17 MED ORDER — ONDANSETRON HCL 4 MG/2ML IJ SOLN
4.0000 mg | Freq: Four times a day (QID) | INTRAMUSCULAR | Status: DC | PRN
  Administered 2016-12-18 (×2): 4 mg via INTRAVENOUS
  Filled 2016-12-17 (×2): qty 2

## 2016-12-17 MED ORDER — MORPHINE SULFATE (PF) 2 MG/ML IV SOLN
2.0000 mg | INTRAVENOUS | Status: DC | PRN
  Administered 2016-12-17 – 2016-12-18 (×3): 2 mg via INTRAVENOUS
  Filled 2016-12-17 (×3): qty 1

## 2016-12-17 NOTE — Progress Notes (Signed)
Patient ID: Susan Mahoney, female   DOB: 06-02-1951, 66 y.o.   MRN: [id number]  PROGRESS NOTE    Susan Mahoney  KCL:[id number] DOB: [birth year] DOA: 12/16/2016  PCP: [name], PA-C   Brief Narrative:  66 y.o. female with medical history significant for stage IV pancoast tumor/lung cancer, DM, HTN, CKD stage 3 who presented to ED with left-sided weakness which started about 1-2 weeks ago and has gotten progressively worse.   CT/MRI on admission confirm brain tumor (?meningioma unrelated to her stage IV lung cancer) with extensive vasogenic edema and subfalcine herniation. Pt given Decadron. Consulted with Dr. Christella Noa and Dr. Julien Nordmann.   Assessment & Plan:   Principal Problem:   Neoplasm of brain causing mass effect on adjacent structures ([place]) / Left sided weakness / Vasogenic brain edema (HCC) - Ct Head showed large hyperdense right parafalcine mass lesion compatible with meningioma. Large amount of vasogenic edema with mass-effect and subfalcine herniation measuring 12 mm.  - MRI brain showed a 5.3 x 5.8 x 5.3 cm right frontal mass with imaging characteristics of a meningioma, resulting in 9 mm RIGHT to LEFT subfalcine herniation with extensive vasogenic edema.   - Neurosurgery will see the pt in consultation - Continue decadron   Active Problems:   Diabetes mellitus type 2 with diabetic nephropathy without long term insulin use (HCC) - Continue SSI - Add novolog 5 units TID    CKD (chronic kidney disease) stage 3, GFR 30-59 ml/min - Cr stable at 1.07    Anemia of chronic disease - Due to CKD and malignancy - Hgb stable     Pancoast tumor of left lung (HCC) - Management per oncology    DVT prophylaxis: SCD's bilaterally  Code Status: full code  Family Communication: husband at the bedside this am Disposition Plan: home once cleared by neurosurgery    Consultants:   Neurosurgery  Oncology   Procedures:   None   Antimicrobials:   None     Subjective: No overnight events.   Objective: Vitals:   12/17/16 0000 12/17/16 0104 12/17/16 0500 12/17/16 1009  BP: 132/85 (!) 159/81 137/86 128/68  Pulse: (!) 103 91 100 92  Resp: (!) '21 20 20 18  '$ Temp:  98.5 F (36.9 C) 98.3 F (36.8 C) 98.2 F (36.8 C)  TempSrc:  Oral Oral Oral  SpO2: 96% 97% 96% 99%  Weight:  69.5 kg (153 lb 4.8 oz)    Height:  '5\' 1"'$  (1.549 m)      Intake/Output Summary (Last 24 hours) at 12/17/16 1204 Last data filed at 12/17/16 0338  Gross per 24 hour  Intake           378.75 ml  Output                0 ml  Net           378.75 ml   Filed Weights   12/16/16 1759 12/17/16 0104  Weight: 68.5 kg (151 lb) 69.5 kg (153 lb 4.8 oz)    Examination:  General exam: Appears calm and comfortable  Respiratory system: Clear to auscultation. Respiratory effort normal. Cardiovascular system: S1 & S2 heard, RRR. No JVD Gastrointestinal system: Abdomen is nondistended, soft and nontender. No organomegaly or masses felt. Normal bowel sounds heard. Central nervous system: Alert and oriented. She has left sided weakness, able to lift UE and LE on both sided but with lot more difficulty on right; left side facial droop  Extremities: Symmetric 5  x 5 power. Skin: No rashes, lesions or ulcers Psychiatry: Judgement and insight appear normal. Mood & affect appropriate.   Data Reviewed: I have personally reviewed following labs and imaging studies  CBC:  Recent Labs Lab 12/16/16 1801 12/17/16 0645  WBC 10.5 6.9  HGB 10.9* 10.0*  HCT 37.0 33.8*  MCV 70.2* 68.7*  PLT 311 834   Basic Metabolic Panel:  Recent Labs Lab 12/16/16 1801 12/17/16 0645  NA 137 139  K 5.1 4.9  CL 101 104  CO2 29 25  GLUCOSE 168* 206*  BUN 22* 19  CREATININE 1.26* 1.07*  CALCIUM 10.1 10.3   GFR: Estimated Creatinine Clearance: 47.4 mL/min (A) (by C-G formula based on SCr of 1.07 mg/dL (H)). Liver Function Tests: No results for input(s): AST, ALT, ALKPHOS, BILITOT, PROT,  ALBUMIN in the last 168 hours. No results for input(s): LIPASE, AMYLASE in the last 168 hours. No results for input(s): AMMONIA in the last 168 hours. Coagulation Profile: No results for input(s): INR, PROTIME in the last 168 hours. Cardiac Enzymes: No results for input(s): CKTOTAL, CKMB, CKMBINDEX, TROPONINI in the last 168 hours. BNP (last 3 results) No results for input(s): PROBNP in the last 8760 hours. HbA1C: No results for input(s): HGBA1C in the last 72 hours. CBG:  Recent Labs Lab 12/16/16 1808 12/17/16 0109 12/17/16 0617 12/17/16 1122  GLUCAP 159* 217* 192* 298*   Lipid Profile: No results for input(s): CHOL, HDL, LDLCALC, TRIG, CHOLHDL, LDLDIRECT in the last 72 hours. Thyroid Function Tests: No results for input(s): TSH, T4TOTAL, FREET4, T3FREE, THYROIDAB in the last 72 hours. Anemia Panel: No results for input(s): VITAMINB12, FOLATE, FERRITIN, TIBC, IRON, RETICCTPCT in the last 72 hours. Urine analysis: No results found for: COLORURINE, APPEARANCEUR, LABSPEC, PHURINE, GLUCOSEU, HGBUR, BILIRUBINUR, KETONESUR, PROTEINUR, UROBILINOGEN, NITRITE, LEUKOCYTESUR Sepsis Labs: '@LABRCNTIP'$ (procalcitonin:4,lacticidven:4)   )No results found for this or any previous visit (from the past 240 hour(s)).    Radiology Studies: Ct Head Wo Contrast Result Date: 12/16/2016 Large hyperdense right parafalcine mass lesion compatible with meningioma. Large amount of vasogenic edema with mass-effect and subfalcine herniation measuring 12 mm.   Mr [place] And Wo Contrast Result Date: 12/16/2016  A 5.3 x 5.8 x 5.3 cm RIGHT frontal mass with imaging characteristics of a meningioma, resulting in 9 mm RIGHT to LEFT subfalcine herniation with extensive vasogenic edema. Recommend noncontrast CT HEAD to assess for calcifications and periosteum reaction. Masses unlikely to represent glioma or metastasis.     Scheduled Meds: . dexamethasone  4 mg Intravenous Q12H  . insulin aspart  0-15 Units  Subcutaneous TID WC  . sodium chloride flush  3 mL Intravenous Q12H   Continuous Infusions: . lactated ringers 75 mL/hr at 12/17/16 0147     LOS: 1 day    Time spent: 25 minutes  Greater than 50% of the time spent on counseling and coordinating the care.   Leisa Lenz, MD [facility] Pager [phone]  If 7PM-7AM, please contact night-coverage [email or web address] Password [facility] 12/17/2016, 12:04 PM

## 2016-12-17 NOTE — Care Management Note (Signed)
Case Management Note  Patient Details  Name: Susan Mahoney MRN: [id number] Date of Birth: [birth year]  Subjective/Objective:         Patient was admitted with neurological deficits, found to have a brain mass. Lives at home with spouse. Patient is currently listed as self-pay. CM will follow for discharge needs pending patient's progress and physician orders.            Action/Plan:   Expected Discharge Date:                  Expected Discharge Plan:     In-House Referral:     Discharge planning Services     Post Acute Care Choice:    Choice offered to:     DME Arranged:    DME Agency:     HH Arranged:    HH Agency:     Status of Service:     If discussed at [employer] of Stay Meetings, dates discussed:    Additional Comments:  Rolm Baptise, RN 12/17/2016, 9:51 AM

## 2016-12-17 NOTE — Progress Notes (Signed)
Pt admitted from [facility] ED with diagnosis of brain mass, pt alert and oriented,c/o of pain on the left side, settled in bed with call light and family at bed, Neuro surgeon paged per order, was however reassured and will continue to monitor. Obasogie-Asidi, Myron Lona Efe

## 2016-12-17 NOTE — Telephone Encounter (Signed)
Spoke with neurosurgery today regarding the patient. She was admitted for left sided weakness and was found to have a large meningioma with extensive vasogenic edema.  Discussed with neurosurgery that patient is on palliative treatment for her stage IV pancoast tumor with opdivo.  Defer to neurosurgery whether to proceed with surgical resection of meningioma.

## 2016-12-17 NOTE — Evaluation (Addendum)
Physical Therapy Evaluation Patient Details Name: Susan Mahoney MRN: [id number] DOB: [birth year] Today's Date: 12/17/2016   History of Present Illness  Pt is a 66 y/o female with a PMH significant for stage IV lung cancer, DM, HTN, stage 3 CKD. She presents with worsening L sided weakness. Per chart review, pt has been experiencing progressing weakness for 1-2 weeks, with the ability to ambulate independently ~ 1 week PTA. Imaging revealed a brain tumor with extensive vasogenic edema and subfalcine herniation.   Clinical Impression  Pt admitted with above diagnosis. Pt currently with functional limitations due to the deficits listed below (see PT Problem List). At the time of PT eval pt was able to perform transfers with +2 mod assist for balance support and safety.  Pt is currently relying on husband for most ADL's and mobility, and is agreeable to SNF at d/c to continue rehab and maximize independence prior to return home. Pt will benefit from acute skilled PT to facilitate d/c to venue listed below.    Follow Up Recommendations      Equipment Recommendations  None recommended by PT    Recommendations for Other Services       Precautions / Restrictions Precautions Precautions: Fall Precaution Comments: Aphasic Restrictions Weight Bearing Restrictions: No      Mobility  Bed Mobility Overal bed mobility: Needs Assistance Bed Mobility: Rolling;Sit to Sidelying Rolling: Min assist       Sit to sidelying: Min assist;+2 for physical assistance General bed mobility comments: VC's for sequencing. Assist for shoulder descent and LE elevation to bed.   Transfers Overall transfer level: Needs assistance Equipment used: 2 person hand held assist Transfers: Sit to/from [facility] Sit to Stand: Mod assist;+2 physical assistance Stand pivot transfers: Mod assist;+2 physical assistance       General transfer comment: Increased assist to initiate standing and pivot  around to the chair.   Ambulation/Gait                Stairs            Wheelchair Mobility    Modified Rankin (Stroke Patients Only)       Balance Overall balance assessment: Needs assistance Sitting-balance support: Bilateral upper extremity supported Sitting balance-Leahy Scale: Good     Standing balance support: During functional activity;Bilateral upper extremity supported Standing balance-Leahy Scale: Poor                               Pertinent Vitals/Pain Pain Assessment: Faces Faces Pain Scale: Hurts little more Pain Location: LUE and axilla area Pain Descriptors / Indicators: Grimacing;Discomfort Pain Intervention(s): Limited activity within patient's tolerance;Monitored during session;Repositioned    Home Living Family/patient expects to be discharged to:: Private residence Living Arrangements: Spouse/significant other Available Help at Discharge: Family;Available 24 hours/day   Home Access: Stairs to enter Entrance Stairs-Rails: Right;Left;Can reach both Entrance Stairs-Number of Steps: 5? Pt unsure Home Layout: One level Home Equipment: [place] - 2 wheels      Prior Function Level of Independence: Needs assistance   Gait / Transfers Assistance Needed: used a RW  ADL's / Homemaking Assistance Needed: Assistance for bathing and dressing        Hand Dominance   Dominant Hand: Right    Extremity/Trunk Assessment   Upper Extremity Assessment Upper Extremity Assessment: Defer to OT evaluation    Lower Extremity Assessment Lower Extremity Assessment: LLE deficits/detail LLE Deficits / Details: Decreased strength  and AROM. Pt demonstrated 3+/5 in quads, 4-/5 in hamstrings, and 3+ in hip flexors. LLE Sensation:  (Pt reports N/T in LE's)    Cervical / Trunk Assessment Cervical / Trunk Assessment: Other exceptions Cervical / Trunk Exceptions: Forward head/rounded shoulder posture  Communication   Communication:  Expressive difficulties  Cognition Arousal/Alertness: Awake/alert Behavior During Therapy: WFL for tasks assessed/performed;Flat affect Overall Cognitive Status: Impaired/Different from baseline Area of Impairment: Orientation;Attention;Memory;Following commands;Safety/judgement;Awareness;Problem solving                 Orientation Level: Disoriented to;Place;Time;Situation Current Attention Level: Sustained Memory: Decreased short-term memory Following Commands: Follows one step commands with increased time;Follows one step commands inconsistently Safety/Judgement: Decreased awareness of safety;Decreased awareness of deficits Awareness: Emergent Problem Solving: Slow processing;Decreased initiation;Difficulty sequencing;Requires verbal cues General Comments: Pt did not know where she was, month (she said april), date (said it was [place]), she did know the year and she did not know why she was in the hospital "I cannot do my daily activities"      General Comments      Exercises     Assessment/Plan    PT Assessment Patient needs continued PT services  PT Problem List Decreased strength;Decreased range of motion;Decreased activity tolerance;Decreased balance;Decreased mobility;Decreased knowledge of use of DME;Decreased safety awareness;Decreased knowledge of precautions;Pain       PT Treatment Interventions DME instruction;Gait training;Stair training;Functional mobility training;Therapeutic activities;Therapeutic exercise;Neuromuscular re-education;Patient/family education    PT Goals (Current goals can be found in the Care Plan section)  Acute Rehab PT Goals Patient Stated Goal: Return home to husband of 41 years PT Goal Formulation: With patient Time For Goal Achievement: 12/31/16 Potential to Achieve Goals: Fair    Frequency Min 4X/week   Barriers to discharge        Co-evaluation PT/OT/SLP Co-Evaluation/Treatment: Yes Reason for Co-Treatment: To address  functional/ADL transfers;For patient/therapist safety PT goals addressed during session: Mobility/safety with mobility;Balance;Proper use of DME         End of Session Equipment Utilized During Treatment: Gait belt Activity Tolerance: Patient tolerated treatment well Patient left: in bed;with call bell/phone within reach;with bed alarm set Nurse Communication: Mobility status PT Visit Diagnosis: Other abnormalities of gait and mobility (R26.89);Hemiplegia and hemiparesis Hemiplegia - Right/Left: Left Hemiplegia - dominant/non-dominant: Non-dominant Hemiplegia - caused by: Other cerebrovascular disease    Time: 1052-1123 PT Time Calculation (min) (ACUTE ONLY): 31 min   Charges:   PT Evaluation $PT Eval Moderate Complexity: 1 Procedure PT Treatments $Gait Training: 8-22 mins   PT G Codes:        Rolinda Roan, PT, DPT Acute Rehabilitation Services Pager: [phone]   Thelma Comp 12/17/2016, 2:50 PM   Rolinda Roan, PT, DPT Acute Rehabilitation Services Pager: [phone]

## 2016-12-17 NOTE — Evaluation (Signed)
Occupational Therapy Evaluation Patient Details Name: Susan Mahoney MRN: [id number] DOB: 06-25-1951 Today's Date: 12/17/2016    History of Present Illness Pt is a 66 y/o female with a PMH significant for stage IV lung cancer, DM, HTN, stage 3 CKD. She presents with worsening L sided weakness. Per chart review, pt has been experiencing progressing weakness for 1-2 weeks, with the ability to ambulate independently ~ 1 week PTA. Imaging revealed a brain tumor with extensive vasogenic edema and subfalcine herniation.    Clinical Impression   PTA Pt getting assistance with ADL and mobility with RW. Pt currently max A for ADL and mod +2 for stand pivot transfers. Pt presenting with LUE weakness and decreased sensation. During session, Pt provided with lotion in right hand and Pt required verbal and tactile cues to put the lotion on the left hand/arm.  No family present to determine baseline.  Pt is currently relying on husband for most ADL's and mobility, and is agreeable to SNF at d/c to continue rehab and maximize independence prior to return home. Pt will benefit from acute skilled OT to facilitate d/c to venue listed below. Next session to focus on therapeutic activities that encourage bilateral hand tasks or using the right hand to attend to the left.    Follow Up Recommendations  SNF;Supervision/Assistance - 24 hour    Equipment Recommendations  Other (comment) (defer to next venue)    Recommendations for Other Services       Precautions / Restrictions Precautions Precautions: Fall Restrictions Weight Bearing Restrictions: No      Mobility Bed Mobility Overal bed mobility: Needs Assistance Bed Mobility: Rolling;Sit to Sidelying Rolling: Min assist       Sit to sidelying: Min assist;+2 for physical assistance General bed mobility comments: VC's for sequencing. Assist for shoulder descent and LE elevation to bed.   Transfers Overall transfer level: Needs assistance Equipment  used: 2 person hand held assist Transfers: Sit to/from [facility] Sit to Stand: Mod assist;+2 physical assistance Stand pivot transfers: Mod assist;+2 physical assistance       General transfer comment: Increased assist to initiate standing and pivot around to the chair.     Balance Overall balance assessment: Needs assistance Sitting-balance support: Bilateral upper extremity supported Sitting balance-Leahy Scale: Good     Standing balance support: During functional activity;Bilateral upper extremity supported Standing balance-Leahy Scale: Poor                             ADL either performed or assessed with clinical judgement   ADL Overall ADL's : At baseline;Needs assistance/impaired Eating/Feeding: Moderate assistance;Sitting   Grooming: Wash/dry face;Moderate assistance;Sitting;Cueing for sequencing (applying lotion)   Upper Body Bathing: Moderate assistance   Lower Body Bathing: Maximal assistance   Upper Body Dressing : Moderate assistance   Lower Body Dressing: Maximal assistance   Toilet Transfer: Moderate assistance;+2 for safety/equipment;Stand-pivot;BSC (2 person hand held assist)   Toileting- Clothing Manipulation and Hygiene: Total assistance   Tub/ Shower Transfer: Maximal assistance   Functional mobility during ADLs: Moderate assistance;+2 for physical assistance;+2 for safety/equipment;Cueing for sequencing General ADL Comments: No familiy present during evaluation to determine baseline - Pt shared that she receives assiatnce at baseline, but not able to relay how much     Vision Baseline Vision/History: Wears glasses Wears Glasses: At all times Vision Assessment?: Vision impaired- to be further tested in functional context     Perception  Praxis      Pertinent Vitals/Pain Pain Assessment: Faces Faces Pain Scale: Hurts little more Pain Location: LUE and axilla area Pain Descriptors / Indicators:  Grimacing;Discomfort Pain Intervention(s): Limited activity within patient's tolerance;Monitored during session;Repositioned     Hand Dominance Right   Extremity/Trunk Assessment Upper Extremity Assessment Upper Extremity Assessment: Generalized weakness;LUE deficits/detail LUE Deficits / Details: LUE can perform some motion in gravity eliminated situations, but does not have any strength from the shoulder LUE Sensation: decreased light touch LUE Coordination: decreased fine motor;decreased gross motor   Lower Extremity Assessment Lower Extremity Assessment: Defer to PT evaluation LLE Deficits / Details: Decreased strength and AROM. Pt demonstrated 3+/5 in quads, 4-/5 in hamstrings, and 3+ in hip flexors. LLE Sensation:  (Pt reports N/T in LE'a)   Cervical / Trunk Assessment Cervical / Trunk Assessment: Other exceptions Cervical / Trunk Exceptions: Forward head/rounded shoulder posture   Communication Communication Communication: Expressive difficulties   Cognition Arousal/Alertness: Awake/alert Behavior During Therapy: WFL for tasks assessed/performed;Flat affect Overall Cognitive Status: Impaired/Different from baseline Area of Impairment: Orientation;Attention;Memory;Following commands;Safety/judgement;Awareness;Problem solving                 Orientation Level: Disoriented to;Place;Time;Situation Current Attention Level: Sustained Memory: Decreased short-term memory Following Commands: Follows one step commands with increased time;Follows one step commands inconsistently Safety/Judgement: Decreased awareness of safety;Decreased awareness of deficits Awareness: Emergent Problem Solving: Slow processing;Decreased initiation;Difficulty sequencing;Requires verbal cues General Comments: Pt did not know where she was, month (she said april), date (said it was [place]), she did know the year and she did not know why she was in the hospital "I cannot do my daily activities"    General Comments       Exercises     Shoulder Instructions      Home Living Family/patient expects to be discharged to:: Private residence Living Arrangements: Spouse/significant other Available Help at Discharge: Family;Available 24 hours/day Type of Home: House Home Access: Stairs to enter [employer] of Steps: 5? Pt unsure Entrance Stairs-Rails: Right;Left;Can reach both Home Layout: One level     Bathroom Shower/Tub: Teacher, early years/pre: Standard Bathroom Accessibility: Yes How Accessible: Accessible via [place] Home Equipment: [place] - 2 wheels          Prior Functioning/Environment Level of Independence: Needs assistance  Gait / Transfers Assistance Needed: used a RW ADL's / Homemaking Assistance Needed: Assistance for bathing and dressing            OT Problem List: Decreased strength;Decreased range of motion;Decreased activity tolerance;Impaired balance (sitting and/or standing);Decreased cognition;Decreased safety awareness;Decreased knowledge of use of DME or AE;Impaired sensation;Impaired UE functional use;Pain      OT Treatment/Interventions: Self-care/ADL training;Therapeutic exercise;Neuromuscular education;Energy conservation;DME and/or AE instruction;Therapeutic activities;Cognitive remediation/compensation;Patient/family education;Balance training    OT Goals(Current goals can be found in the care plan section) Acute Rehab OT Goals Patient Stated Goal: Return home to husband of 41 years OT Goal Formulation: With patient Time For Goal Achievement: 12/31/16 Potential to Achieve Goals: Good ADL Goals Pt Will Perform Grooming: with min guard assist;sitting Pt Will Transfer to Toilet: with mod assist;stand pivot transfer;bedside commode Pt Will Perform Toileting - Clothing Manipulation and hygiene: with mod assist;sit to/from stand  OT Frequency: Min 2X/week   Barriers to D/C:            Co-evaluation PT/OT/SLP  Co-Evaluation/Treatment: Yes Reason for Co-Treatment: To address functional/ADL transfers;For patient/therapist safety PT goals addressed during session: Mobility/safety with mobility;Balance;Proper use of DME OT goals addressed  during session: ADL's and self-care      End of Session Equipment Utilized During Treatment: Gait belt Nurse Communication: Mobility status  Activity Tolerance: Patient tolerated treatment well Patient left: in bed;with call bell/phone within reach;with bed alarm set  OT Visit Diagnosis: Unsteadiness on feet (R26.81);Muscle weakness (generalized) (M62.81);Hemiplegia and hemiparesis;Other symptoms and signs involving the nervous system (R29.898);Other symptoms and signs involving cognitive function Hemiplegia - Right/Left: Left Hemiplegia - dominant/non-dominant: Non-Dominant Hemiplegia - caused by: Unspecified                Time: 1947-1252 OT Time Calculation (min): 31 min Charges:  OT General Charges $OT Visit: 1 Procedure OT Evaluation $OT Eval Moderate Complexity: 1 Procedure G-Codes:     Hulda Humphrey OTR/L [place] 12/17/2016, 4:52 PM

## 2016-12-18 LAB — BASIC METABOLIC PANEL
Anion gap: 10 (ref 5–15)
BUN: 22 mg/dL — ABNORMAL HIGH (ref 6–20)
CALCIUM: 9.8 mg/dL (ref 8.9–10.3)
CO2: 25 mmol/L (ref 22–32)
CREATININE: 1.12 mg/dL — AB (ref 0.44–1.00)
Chloride: 102 mmol/L (ref 101–111)
GFR calc Af Amer: 59 mL/min — ABNORMAL LOW (ref >60)
GFR, EST NON AFRICAN AMERICAN: 51 mL/min — AB (ref >60)
GLUCOSE: 225 mg/dL — AB (ref 65–99)
Potassium: 4 mmol/L (ref 3.5–5.1)
Sodium: 137 mmol/L (ref 135–145)

## 2016-12-18 LAB — CBC
HCT: 32.6 % — ABNORMAL LOW (ref 36.0–46.0)
HEMOGLOBIN: 9.5 g/dL — AB (ref 12.0–15.0)
MCH: 19.8 pg — ABNORMAL LOW (ref 26.0–34.0)
MCHC: 29.1 g/dL — AB (ref 30.0–36.0)
MCV: 68.1 fL — ABNORMAL LOW (ref 78.0–100.0)
Platelets: 264 10*3/uL (ref 150–400)
RBC: 4.79 MIL/uL (ref 3.87–5.11)
RDW: 20 % — AB (ref 11.5–15.5)
WBC: 14.3 10*3/uL — ABNORMAL HIGH (ref 4.0–10.5)

## 2016-12-18 LAB — GLUCOSE, CAPILLARY
Glucose-Capillary: 183 mg/dL — ABNORMAL HIGH (ref 65–99)
Glucose-Capillary: 197 mg/dL — ABNORMAL HIGH (ref 65–99)
Glucose-Capillary: 207 mg/dL — ABNORMAL HIGH (ref 65–99)
Glucose-Capillary: 158 mg/dL — ABNORMAL HIGH (ref 65–99)

## 2016-12-18 NOTE — Progress Notes (Addendum)
Patient ID: Susan Mahoney, female   DOB: [birth year], 66 y.o.   MRN: [id number]  PROGRESS NOTE    HELI DINO  RWE:[id number] DOB: 1951/07/03 DOA: 12/16/2016  PCP: [name], PA-C   Brief Narrative:  66 y.o. female with medical history significant for stage IV pancoast tumor/lung cancer, DM, HTN, CKD stage 3 who presented to ED with left-sided weakness which started about 1-2 weeks ago and has gotten progressively worse.   CT/MRI on admission confirm brain tumor (?meningioma unrelated to her stage IV lung cancer) with extensive vasogenic edema and subfalcine herniation. Pt given Decadron. Consulted with Dr. Christella Noa and Dr. Julien Nordmann.   Assessment & Plan:   Principal Problem:   Neoplasm of brain causing mass effect on adjacent structures ([place]) / Left sided weakness / Vasogenic brain edema (HCC) - Ct Head showed large hyperdense right parafalcine mass lesion compatible with meningioma. Large amount of vasogenic edema with mass-effect and subfalcine herniation measuring 12 mm.  - MRI brain showed a 5.3 x 5.8 x 5.3 cm right frontal mass with imaging characteristics of a meningioma, resulting in 9 mm RIGHT to LEFT subfalcine herniation with extensive vasogenic edema.   - Neurosurgery will see the pt in consultation; awaiting their recommendations - Obtain PT eval - Continue decadron   Active Problems:   Diabetes mellitus type 2 with diabetic nephropathy without long term insulin use (HCC) - Continue SSI and novolog 5 units TID    CKD (chronic kidney disease) stage 3, GFR 30-59 ml/min - Cr stable    Anemia of chronic disease - Due to CKD and malignancy - Hgb stable     Pancoast tumor of left lung (HCC) - Management per oncology    DVT prophylaxis: SCD's bilaterally  Code Status: full code  Family Communication: husband at the bedside this am Disposition Plan: home once cleared by neurosurgery    Consultants:   Neurosurgery  Oncology   Procedures:   None    Antimicrobials:   None    Subjective: No overnight events.   Objective: Vitals:   12/17/16 2108 12/18/16 0136 12/18/16 0636 12/18/16 0946  BP: (!) 160/76 (!) 172/85 (!) 178/88 (!) 167/85  Pulse: (!) 106 98 78 94  Resp: '18 18 16 18  '$ Temp: 98.9 F (37.2 C) 98.6 F (37 C) 98.6 F (37 C) 98.8 F (37.1 C)  TempSrc: Oral Oral Oral Oral  SpO2: 99% 96% 100% 98%  Weight:      Height:       No intake or output data in the 24 hours ending 12/18/16 1351 Filed Weights   12/16/16 1759 12/17/16 0104  Weight: 68.5 kg (151 lb) 69.5 kg (153 lb 4.8 oz)    Examination:  General exam: Appears calm and comfortable, no distress  Respiratory system: NO wheezing, no rhonchi  Cardiovascular system: S1 & S2 heard, Rate controlled  Gastrointestinal system: (+) BS, non tender abdomen  Central nervous system: Alert and oriented. She has left sided weakness, able to lift UE and LE on both sided but with lot more difficulty on right; left side facial droop  Extremities: no edema, palpable pulses  Skin: warm and dry Psychiatry: Normal mood and behavior   Data Reviewed: I have personally reviewed following labs and imaging studies  CBC:  Recent Labs Lab 12/16/16 1801 12/17/16 0645 12/18/16 0259  WBC 10.5 6.9 14.3*  HGB 10.9* 10.0* 9.5*  HCT 37.0 33.8* 32.6*  MCV 70.2* 68.7* 68.1*  PLT 311 247 264  Basic Metabolic Panel:  Recent Labs Lab 12/16/16 1801 12/17/16 0645 12/18/16 0259  NA 137 139 137  K 5.1 4.9 4.0  CL 101 104 102  CO2 '29 25 25  '$ GLUCOSE 168* 206* 225*  BUN 22* 19 22*  CREATININE 1.26* 1.07* 1.12*  CALCIUM 10.1 10.3 9.8   GFR: Estimated Creatinine Clearance: 45.3 mL/min (A) (by C-G formula based on SCr of 1.12 mg/dL (H)). Liver Function Tests: No results for input(s): AST, ALT, ALKPHOS, BILITOT, PROT, ALBUMIN in the last 168 hours. No results for input(s): LIPASE, AMYLASE in the last 168 hours. No results for input(s): AMMONIA in the last 168  hours. Coagulation Profile: No results for input(s): INR, PROTIME in the last 168 hours. Cardiac Enzymes: No results for input(s): CKTOTAL, CKMB, CKMBINDEX, TROPONINI in the last 168 hours. BNP (last 3 results) No results for input(s): PROBNP in the last 8760 hours. HbA1C: No results for input(s): HGBA1C in the last 72 hours. CBG:  Recent Labs Lab 12/17/16 1122 12/17/16 1634 12/17/16 2128 12/18/16 0653 12/18/16 1232  GLUCAP 298* 232* 180* 183* 197*   Lipid Profile: No results for input(s): CHOL, HDL, LDLCALC, TRIG, CHOLHDL, LDLDIRECT in the last 72 hours. Thyroid Function Tests: No results for input(s): TSH, T4TOTAL, FREET4, T3FREE, THYROIDAB in the last 72 hours. Anemia Panel: No results for input(s): VITAMINB12, FOLATE, FERRITIN, TIBC, IRON, RETICCTPCT in the last 72 hours. Urine analysis: No results found for: COLORURINE, APPEARANCEUR, LABSPEC, PHURINE, GLUCOSEU, HGBUR, BILIRUBINUR, KETONESUR, PROTEINUR, UROBILINOGEN, NITRITE, LEUKOCYTESUR Sepsis Labs: '@LABRCNTIP'$ (procalcitonin:4,lacticidven:4)   )No results found for this or any previous visit (from the past 240 hour(s)).    Radiology Studies: Ct Head Wo Contrast Result Date: 12/16/2016 Large hyperdense right parafalcine mass lesion compatible with meningioma. Large amount of vasogenic edema with mass-effect and subfalcine herniation measuring 12 mm.   Mr Jeri Cos And Wo Contrast Result Date: 12/16/2016  A 5.3 x 5.8 x 5.3 cm RIGHT frontal mass with imaging characteristics of a meningioma, resulting in 9 mm RIGHT to LEFT subfalcine herniation with extensive vasogenic edema. Recommend noncontrast CT HEAD to assess for calcifications and periosteum reaction. Masses unlikely to represent glioma or metastasis.     Scheduled Meds: . dexamethasone  4 mg Intravenous Q12H  . insulin aspart  0-15 Units Subcutaneous TID WC  . insulin aspart  5 Units Subcutaneous TID WC  . sodium chloride flush  3 mL Intravenous Q12H    Continuous Infusions: . lactated ringers 75 mL/hr at 12/18/16 0438     LOS: 2 days    Time spent: 15 minutes  Greater than 50% of the time spent on counseling and coordinating the care.   Leisa Lenz, MD [facility] Pager [phone]  If 7PM-7AM, please contact night-coverage [email or web address] Password [email or web address] 12/18/2016, 1:51 PM

## 2016-12-19 LAB — GLUCOSE, CAPILLARY
GLUCOSE-CAPILLARY: 216 mg/dL — AB (ref 65–99)
Glucose-Capillary: 188 mg/dL — ABNORMAL HIGH (ref 65–99)

## 2016-12-19 MED ORDER — DEXAMETHASONE 4 MG PO TABS: 4.0000 mg | ORAL_TABLET | Freq: Two times a day (BID) | ORAL | 0 refills | Status: AC

## 2016-12-19 MED ORDER — ACETAMINOPHEN 325 MG PO TABS: 650.0000 mg | ORAL_TABLET | Freq: Four times a day (QID) | ORAL | 0 refills | Status: AC | PRN

## 2016-12-19 NOTE — Discharge Instructions (Signed)
Dexamethasone tablets What is this medicine? DEXAMETHASONE (dex a METH a sone) is a corticosteroid. It is commonly used to treat inflammation of the skin, joints, lungs, and other organs. Common conditions treated include asthma, allergies, and arthritis. It is also used for other conditions, such as blood disorders and diseases of the adrenal glands. This medicine may be used for other purposes; ask your health care provider or pharmacist if you have questions. COMMON BRAND NAME(S): [employer], Decadron, DexPak Jr TaperPak, DexPak TaperPak, Zema-Pak, ZoDex, ZonaCort 11 Day, ZonaCort 7 Day What should I tell my health care provider before I take this medicine? They need to know if you have any of these conditions: -Cushing's syndrome -diabetes -glaucoma -heart problems or disease -high blood pressure -infection like herpes, measles, tuberculosis, or chickenpox -kidney disease -liver disease -mental problems -myasthenia gravis -osteoporosis -previous heart attack -seizures -stomach, ulcer or intestine disease including colitis and diverticulitis -thyroid problem -an unusual or allergic reaction to dexamethasone, corticosteroids, other medicines, lactose, foods, dyes, or preservatives -pregnant or trying to get pregnant -breast-feeding How should I use this medicine? Take this medicine by mouth with a drink of water. Follow the directions on the prescription label. Take it with food or milk to avoid stomach upset. If you are taking this medicine once a day, take it in the morning. Do not take more medicine than you are told to take. Do not suddenly stop taking your medicine because you may develop a severe reaction. Your doctor will tell you how much medicine to take. If your doctor wants you to stop the medicine, the dose may be slowly lowered over time to avoid any side effects. Talk to your pediatrician regarding the use of this medicine in children. Special care may be  needed. Patients over 60 years old may have a stronger reaction and need a smaller dose. Overdosage: If you think you have taken too much of this medicine contact a poison control center or emergency room at once. NOTE: This medicine is only for you. Do not share this medicine with others. What if I miss a dose? If you miss a dose, take it as soon as you can. If it is almost time for your next dose, talk to your doctor or health care professional. You may need to miss a dose or take an extra dose. Do not take double or extra doses without advice. What may interact with this medicine? Do not take this medicine with any of the following medications: -mifepristone, RU-486 -vaccines This medicine may also interact with the following medications: -amphotericin B -antibiotics like clarithromycin, erythromycin, and troleandomycin -aspirin and aspirin-like drugs -barbiturates like phenobarbital -carbamazepine -cholestyramine -cholinesterase inhibitors like donepezil, galantamine, rivastigmine, and tacrine -cyclosporine -digoxin -diuretics -ephedrine -female hormones, like estrogens or progestins and birth control pills -indinavir -isoniazid -ketoconazole -medicines for diabetes -medicines that improve muscle tone or strength for conditions like myasthenia gravis -NSAIDs, medicines for pain and inflammation, like ibuprofen or naproxen -phenytoin -rifampin -thalidomide -warfarin This list may not describe all possible interactions. Give your health care provider a list of all the medicines, herbs, non-prescription drugs, or dietary supplements you use. Also tell them if you smoke, drink alcohol, or use illegal drugs. Some items may interact with your medicine. What should I watch for while using this medicine? Visit your doctor or health care professional for regular checks on your progress. If you are taking this medicine over a prolonged period, carry an identification card with your name  and address, the  type and dose of your medicine, and your doctor's name and address. This medicine may increase your risk of getting an infection. Stay away from people who are sick. Tell your doctor or health care professional if you are around anyone with measles or chickenpox. If you are going to have surgery, tell your doctor or health care professional that you have taken this medicine within the last twelve months. Ask your doctor or health care professional about your diet. You may need to lower the amount of salt you eat. The medicine can increase your blood sugar. If you are a diabetic check with your doctor if you need help adjusting the dose of your diabetic medicine. What side effects may I notice from receiving this medicine? Side effects that you should report to your doctor or health care professional as soon as possible: -allergic reactions like skin rash, itching or hives, swelling of the face, lips, or tongue -changes in vision -fever, sore throat, sneezing, cough, or other signs of infection, wounds that will not heal -increased thirst -mental depression, mood swings, mistaken feelings of self importance or of being mistreated -pain in hips, back, ribs, arms, shoulders, or legs -redness, blistering, peeling or loosening of the skin, including inside the mouth -trouble passing urine or change in the amount of urine -swelling of feet or lower legs -unusual bleeding or bruising Side effects that usually do not require medical attention (report to your doctor or health care professional if they continue or are bothersome): -headache -nausea, vomiting -skin problems, acne, thin and shiny skin -weight gain This list may not describe all possible side effects. Call your doctor for medical advice about side effects. You may report side effects to FDA at 1-800-FDA-1088. Where should I keep my medicine? Keep out of the reach of children. Store at room temperature between 20 and 25  degrees C (68 and 77 degrees F). Protect from light. Throw away any unused medicine after the expiration date. NOTE: This sheet is a summary. It may not cover all possible information. If you have questions about this medicine, talk to your doctor, pharmacist, or health care provider.  2018 Elsevier/Gold Standard (2007-12-29 14:02:13)   Weakness Weakness is a lack of strength. You may feel weak all over your body (generalized), or you may feel weak in one specific part of your body (focal). Common causes of weakness include:  Infection and immune system disorders.  Physical exhaustion.  Internal bleeding or other blood loss that results in a lack of red blood cells (anemia).  Dehydration.  An imbalance in mineral (electrolyte) levels, such as potassium.  Heart disease, circulation problems, or stroke. Other causes include:  Some medicines or cancer treatment.  Stress, anxiety, or depression.  Nervous system disorders.  Thyroid disorders.  Loss of muscle strength because of age or inactivity.  Poor sleep quality or sleep disorders. The cause of your weakness may not be known. Some causes of weakness can be serious, so it is important to see your health care provider. Follow these instructions at home:  Rest as needed.  Try to get enough sleep. Talk to your health care provider about how much sleep you need each night.  Take over-the-counter and prescription medicines only as told by your health care provider.  Eat a healthy, well-balanced diet. This includes:  Proteins to build muscles, such as lean meats and fish.  Fresh fruits and vegetables.  Carbohydrates to boost energy, such as whole grains.  Drink enough fluid to keep your  urine clear or pale yellow.  Do strength exercises, such as arm curls and leg raises, for 30 minutes at least 2 days a week or as told by your health care provider.  Consider working with a physical therapist or trainer who can develop an  exercise plan to help you gain muscle strength.  Keep all follow-up visits as told by your health care provider. This is important. Contact a health care provider if:  Your weakness does not improve or gets worse.  Your weakness affects your ability to think clearly.  Your weakness affects your ability to do your normal daily activities. Get help right away if:  You develop sudden weakness.  You have trouble breathing or shortness of breath.  You have problems with your vision.  You have trouble talking or swallowing.  You have trouble standing or walking.  You have chest pain.  You are light-headed or lose consciousness. This information is not intended to replace advice given to you by your health care provider. Make sure you discuss any questions you have with your health care provider. Document Released: 09/07/2005 Document Revised: 10/03/2015 Document Reviewed: 06/28/2015 Elsevier Interactive Patient Education  2017 [employer].

## 2016-12-19 NOTE — Progress Notes (Signed)
Could not get in touch with Dr. Christella Noa 3/30 or 3/31. He saw the pt and family 3/29 but no note left so called Dr. Vertell Limber who recommended PT eval and continuing decadron. PT eval completed, spoke with Dr. Vertell Limber again today as Dr. Christella Noa not on today. Recommendation was for decadron on discharge, calling Dr. Lacy Duverney office on Monday and setting up appt with him.  Leisa Lenz [facility] [phone]

## 2016-12-19 NOTE — Progress Notes (Signed)
Called pt spouse on cell phone and home phone, no answer. I called pt daughter, left VM and my cell # to call me back for Q's or concerns.  I also informed her of pt discharge today and [place] orders placed. Leisa Lenz [facility] [phone]

## 2016-12-19 NOTE — Discharge Summary (Signed)
Physician Discharge Summary  Susan Mahoney CVE:[id number] DOB: Jun 25, 1951 DOA: 12/16/2016  PCP: [name], PA-C  Admit date: 12/16/2016 Discharge date: 12/19/2016  Recommendations for Outpatient Follow-up:  Per neurosurgery, continue decadron 4 mg twice a day, call Dr. Lacy Duverney office on Monday 4/2 and schedule an appointment.  Continue glipizide.  Discharge Diagnoses:  Principal Problem:   Neoplasm of brain causing mass effect on adjacent structures [facility]) Active Problems:   Diabetes mellitus type 2 in obese (HCC)   Anemia   CKD (chronic kidney disease) stage 3, GFR 30-59 ml/min   Pancoast tumor of left lung (HCC)   Vasogenic brain edema (HCC)    Discharge Condition: stable   Diet recommendation: as tolerated   History of present illness:   66 y.o.femalewith medical history significant for stage IV pancoast tumor/lung cancer, DM, HTN, CKD stage 3 who presented to ED with left-sided weakness which started about 1-2 weeks ago and has gotten progressively worse.  CT/MRI on admission confirm brain tumor (?meningioma unrelated to her stage IV lung cancer) with extensive vasogenic edema and subfalcine herniation. Pt given Decadron. Consulted with Dr. Christella Noa and Dr. Julien Nordmann.   Hospital Course:   Principal Problem:   Neoplasm of brain causing mass effect on adjacent structures ([place]) / Left sided weakness / Vasogenic brain edema (HCC) - Ct Head showed large hyperdense right parafalcine mass lesion compatible with meningioma. Large amount of vasogenic edema with mass-effect and subfalcine herniation measuring 12 mm.  - MRI brain showed a 5.3 x 5.8 x 5.3 cm right frontal mass with imaging characteristics of a meningioma, resulting in 9 mm RIGHT to LEFT subfalcine herniation with extensive vasogenic edema.  - Obtain PT eval - HH PT and OT order placed  - Continue decadron 4 mg BID  Active Problems:   Diabetes mellitus type 2 with diabetic nephropathy without long term  insulin use (HCC) - Resume home med glipizide     CKD (chronic kidney disease) stage 3, GFR 30-59 ml/min - Cr stable    Anemia of chronic disease - Due to CKD and malignancy - Hgb stable     Pancoast tumor of left lung ([place]) - Management per oncology    DVT prophylaxis: SCD's bilaterally  Code Status: full code  Family Communication: no family at the bedside     Consultants:   Neurosurgery  Oncology   Procedures:   None   Antimicrobials:   None   Signed:  Leisa Lenz, MD  [facility] 12/19/2016, 9:24 AM  Pager #: [phone]  Time spent in minutes: less than 30 minutes   Discharge Exam: Vitals:   12/19/16 0512 12/19/16 0514  BP: (!) 177/119 (!) 165/97  Pulse: (!) 103 (!) 102  Resp: 18   Temp: 98.7 F (37.1 C)    Vitals:   12/18/16 2049 12/19/16 0018 12/19/16 0512 12/19/16 0514  BP: (!) 154/72 (!) 176/95 (!) 177/119 (!) 165/97  Pulse: 79 81 (!) 103 (!) 102  Resp: '16 16 18   '$ Temp: 98.9 F (37.2 C) 98.5 F (36.9 C) 98.7 F (37.1 C)   TempSrc: Oral Oral Oral   SpO2: 97% 98% 99%   Weight:      Height:        General: Pt is alert, follows commands appropriately, not in acute distress Cardiovascular: Regular rate and rhythm, S1/S2 +, no murmurs Respiratory: Clear to auscultation bilaterally, no wheezing, no crackles, no rhonchi Abdominal: Soft, non tender, non distended, bowel sounds +, no guarding Extremities:  no edema, no cyanosis, pulses palpable bilaterally DP and PT Neuro: Left side weakness  Discharge Instructions  Discharge Instructions    Call MD for:  persistant nausea and vomiting    Complete by:  As directed    Call MD for:  redness, tenderness, or signs of infection (pain, swelling, redness, odor or green/yellow discharge around incision site)    Complete by:  As directed    Call MD for:  severe uncontrolled pain    Complete by:  As directed    Diet - low sodium heart healthy    Complete by:  As directed     Discharge instructions    Complete by:  As directed    Per neurosurgery, continue decadron 4 mg twice a day, call Dr. Lacy Duverney office on Monday 4/2 and schedule an appointment.  Continue glipizide.   Increase activity slowly    Complete by:  As directed      Allergies as of 12/19/2016      Reactions   Ace Inhibitors Swelling   Indomethacin    Swelling      Medication List    STOP taking these medications   predniSONE 10 MG tablet Commonly known as:  DELTASONE     TAKE these medications   acetaminophen 325 MG tablet Commonly known as:  TYLENOL Take 2 tablets (650 mg total) by mouth every 6 (six) hours as needed for mild pain (or Fever >/= 101).   albuterol 108 (90 Base) MCG/ACT inhaler Commonly known as:  PROVENTIL HFA;VENTOLIN HFA Inhale 1-2 puffs into the lungs every 6 (six) hours as needed for wheezing or shortness of breath.   conjugated estrogens vaginal cream Commonly known as:  PREMARIN Use .5 gm on vagina area daily for 2 weeks then 2-3 x daily What changed:  how much to take  how to take this  when to take this  additional instructions   dexamethasone 4 MG tablet Commonly known as:  DECADRON Take 1 tablet (4 mg total) by mouth 2 (two) times daily.   ferrous gluconate 324 MG tablet Commonly known as:  FERGON Take 1 tablet (324 mg total) by mouth 2 (two) times daily with a meal.   glipiZIDE 10 MG tablet Commonly known as:  GLUCOTROL Take 5 mg by mouth daily after supper.   HYDROcodone-acetaminophen 5-325 MG tablet Commonly known as:  NORCO Take 1-2 tablets by mouth every 4 (four) hours as needed for moderate pain.   lidocaine-prilocaine cream Commonly known as:  EMLA Apply a quarter size amount to port site 1 hour prior to chemo. Do not rub in. Cover with plastic wrap.   magic mouthwash w/lidocaine Soln Equal parts of: Benadryl 12.'5mg'$ /78m, Viscous lidocaine 2%, and Maalox. Swish and swallow 5 mL four times a day. What changed:  how much to  take  how to take this  when to take this  reasons to take this  additional instructions   megestrol 400 MG/10ML suspension Commonly known as:  MEGACE Take 10 mLs (400 mg total) by mouth daily. What changed:  when to take this  reasons to take this   morphine 15 MG 12 hr tablet Commonly known as:  MS CONTIN Take 1 tablet (15 mg total) by mouth every 12 (twelve) hours. What changed:  when to take this  reasons to take this   ondansetron 8 MG tablet Commonly known as:  ZOFRAN Take 1 tablet (8 mg total) by mouth every 8 (eight) hours as needed for nausea or  vomiting.   OPDIVO IV Inject into the vein every 14 (fourteen) days.   potassium chloride SA 20 MEQ tablet Commonly known as:  K-DUR,KLOR-CON Take 1 tablet (20 mEq total) by mouth 2 (two) times daily.   prochlorperazine 10 MG tablet Commonly known as:  COMPAZINE Take 1 tablet (10 mg total) by mouth every 6 (six) hours as needed for nausea or vomiting.   VITAMIN D PO Take 1 tablet by mouth daily. Reported on 03/31/2016      Follow-up Information    BAUCOM, JENNY B, PA-C. Schedule an appointment as soon as possible for a visit in 1 week(s).   Specialty:  Physician Assistant Contact information: [address] [place] [address] [phone]        Winfield Cunas, MD. Schedule an appointment as soon as possible for a visit on 12/21/2016.   Specialty:  Neurosurgery Why:  Call the office and schedule an appointment  Contact information: [address] [place] [address] [phone]            The results of significant diagnostics from this hospitalization (including imaging, microbiology, ancillary and laboratory) are listed below for reference.    Significant Diagnostic Studies: Ct Head Wo Contrast  Result Date: 12/16/2016 CLINICAL DATA:  Brain tumor EXAM: CT HEAD WITHOUT CONTRAST TECHNIQUE: Contiguous axial images were obtained from the base of the skull through the  vertex without intravenous contrast. COMPARISON:  MRI head 12/16/2016 FINDINGS: Brain: Large mass in the right frontal lobe measures 5.2 x 5.1 cm. The mass is hyperdense to the brain. There is some mild cystic degeneration within the mass. No significant calcification. The mass appears be attached to the falx. Large amount of vasogenic edema in the adjacent white matter. Mass-effect on the frontal horns. There is subfalcine herniation measuring approximately 12 mm unchanged from the earlier today. Negative for hydrocephalus. No acute hemorrhage. Negative for acute infarction. Vascular: No hyperdense vessel or unexpected calcification. Skull: No significant skull abnormality. No hyperostosis of the right frontal bone. Sinuses/Orbits: Negative Other: None IMPRESSION: Large hyperdense right parafalcine mass lesion compatible with meningioma. Large amount of vasogenic edema with mass-effect and subfalcine herniation measuring 12 mm. Electronically Signed   By: Franchot Gallo M.D.   On: 12/16/2016 20:08   Mr Brain W And Wo Contrast  Result Date: 12/16/2016 CLINICAL DATA:  LEFT-sided weakness and confusion for 3 weeks. History of metastatic lung cancer, hypertension and diabetes. EXAM: MRI HEAD WITHOUT AND WITH CONTRAST TECHNIQUE: Multiplanar, multiecho pulse sequences of the brain and surrounding structures were obtained without and with intravenous contrast. CONTRAST:  40m MULTIHANCE GADOBENATE DIMEGLUMINE 529 MG/ML IV SOLN COMPARISON:  None. FINDINGS: INTRACRANIAL CONTENTS: 5.3 x 5.8 x 5.3 cm (transverse by AP by CC) low T1, low T2 RIGHT frontal and avidly enhancing mass with tiny flow voids along the inferior margin. Mass demonstrates reduced diffusion and decreased ADC values. No reduced diffusion to suggest acute ischemia. Broad dural contact, contiguous with RIGHT superior sagittal sinus. Surrounding cerebral spinal fluid cleft sign. Surrounding T2 bright vasogenic edema extending to the posterior RIGHT  frontal lobe. Mass-effect on RIGHT frontal horn of the lateral ventricle without hydrocephalus or entrapment. 9 mm RIGHT to LEFT subfalcine herniation. No satellite areas of abnormal enhancement. No abnormal extra-axial fluid collections. VASCULAR: Normal major intracranial vascular flow voids present at skull base. SKULL AND UPPER CERVICAL SPINE: No abnormal sellar expansion. No suspicious calvarial bone marrow signal. Craniocervical junction maintained. SINUSES/ORBITS: The mastoid air-cells and included  paranasal sinuses are well-aerated.The included ocular globes and orbital contents are non-suspicious. OTHER: RIGHT suboccipital sebaceous cyst. IMPRESSION: A 5.3 x 5.8 x 5.3 cm RIGHT frontal mass with imaging characteristics of a meningioma, resulting in 9 mm RIGHT to LEFT subfalcine herniation with extensive vasogenic edema. Recommend noncontrast CT HEAD to assess for calcifications and periosteum reaction. Masses unlikely to represent glioma or metastasis. Acute findings discussed with and reconfirmed by Dr.NATHAN PICKERING on 12/16/2016 at 7:07 pm. Electronically Signed   By: Elon Alas M.D.   On: 12/16/2016 19:08    Microbiology: No results found for this or any previous visit (from the past 240 hour(s)).   Labs: Basic Metabolic Panel:  Recent Labs Lab 12/16/16 1801 12/17/16 0645 12/18/16 0259  NA 137 139 137  K 5.1 4.9 4.0  CL 101 104 102  CO2 '29 25 25  '$ GLUCOSE 168* 206* 225*  BUN 22* 19 22*  CREATININE 1.26* 1.07* 1.12*  CALCIUM 10.1 10.3 9.8   Liver Function Tests: No results for input(s): AST, ALT, ALKPHOS, BILITOT, PROT, ALBUMIN in the last 168 hours. No results for input(s): LIPASE, AMYLASE in the last 168 hours. No results for input(s): AMMONIA in the last 168 hours. CBC:  Recent Labs Lab 12/16/16 1801 12/17/16 0645 12/18/16 0259  WBC 10.5 6.9 14.3*  HGB 10.9* 10.0* 9.5*  HCT 37.0 33.8* 32.6*  MCV 70.2* 68.7* 68.1*  PLT 311 247 264   Cardiac Enzymes: No  results for input(s): CKTOTAL, CKMB, CKMBINDEX, TROPONINI in the last 168 hours. BNP: BNP (last 3 results) No results for input(s): BNP in the last 8760 hours.  ProBNP (last 3 results) No results for input(s): PROBNP in the last 8760 hours.  CBG:  Recent Labs Lab 12/18/16 0653 12/18/16 1232 12/18/16 1702 12/18/16 2234 12/19/16 0624  GLUCAP 183* 197* 207* 158* 188*

## 2016-12-19 NOTE — Care Management Note (Signed)
Case Management Note  Patient Details  Name: Susan Mahoney MRN: [id number] Date of Birth: 03/24/1951  Subjective/Objective:                 Patient from home, admitted with brain tumor. Patient with orders to DC to home and have [facility] PT OT RN. Patient without insurance. Verified with [place] clinical liaison Jermaine that diagnosis does not qualify for [facility] PT OT, but [facility] would be able to provide [facility] RN. Referral placed for anticipated DC today.   Action/Plan:   Expected Discharge Date:  12/19/16               Expected Discharge Plan:  [place]  In-House Referral:     Discharge planning Services  CM Consult  Post Acute Care Choice:  Home Health Choice offered to:     DME Arranged:    DME Agency:     HH Arranged:  RN [place] Agency:  [place]  Status of Service:     If discussed at [place] of Stay Meetings, dates discussed:    Additional Comments:  Carles Collet, RN 12/19/2016, 9:53 AM

## 2016-12-19 NOTE — Progress Notes (Signed)
Patient ID: Susan Mahoney, female   DOB: 27-Oct-1950, 66 y.o.   MRN: [id number] Patient was seen and discussion held with family. They understand that without treatment meaning surgery that this tumor will likely lead to her demise. However they also understand that the Lung ca cannot be cured, is progressing, and that her outlook is brief. [name] have made a decision to try and transfer her care to [facility] for her oncology. They also do not want operative intervention under any circumstance. I do recommend she stay on Keppra '500mg'$  bid for life, and decadron '2mg'$  po bid again for the rest of her life.

## 2016-12-19 NOTE — Progress Notes (Signed)
Patient given discharge instructions.  Family at bedside.  All questions and concerns addressed.  IV catheter removed.  Patient assisted with dressing.  Patient left unit by wheelchair accompanied by staff.  Spouse removed all belongings.

## 2016-12-19 NOTE — Progress Notes (Signed)
qPhysical Therapy Treatment Patient Details Name: Susan Mahoney MRN: [id number] DOB: 07/12/1951 Today's Date: 12/19/2016    History of Present Illness Pt is a 66 y/o female with a PMH significant for stage IV lung cancer, DM, HTN, stage 3 CKD. She presents with worsening Mahoney sided weakness. Per chart review, pt has been experiencing progressing weakness for 1-2 weeks, with the ability to ambulate independently ~ 1 week PTA. Imaging revealed a brain tumor with extensive vasogenic edema and subfalcine herniation.     PT Comments    Patient sitting in chair on arrival, poor safety recall and insight to impairments/deficits.  Impulsive, standing without support despite inability to use Left side effectively.  MOD assist plus safety cues for gait to bathroom, increased assist for turning/transfer.  Patient would have fallen on multiple occasions during room mobility without assist.  Spouse reports she thought mobility "went well" on debriefing.  Patient's spouse not present during treatment to fully assess assist capability by family.  Overall patient presenting with recent declining mobility due to mass effect, anticipate further increase in symptoms over time with corresponding increase in level of assist required.  Recommend increased time for therapy either in hospital or SNF setting due to decreased safety insight and family training.    Follow Up Recommendations  SNF;Supervision/Assistance - 24 hour (Patient wants home, not deemed safe DC by PT)     Equipment Recommendations  None recommended by PT    Recommendations for Other Services Speech consult;OT consult     Precautions / Restrictions Precautions Precautions: Fall Precaution Comments: Left side hemiparesis Required Braces or Orthoses:  (None used, may benefit from arm sling if continued UE weakne) Restrictions Weight Bearing Restrictions: No    Mobility  Bed Mobility Overal bed mobility: Needs Assistance Bed Mobility: Sit to  Supine       Sit to supine: Min assist;Mod assist   General bed mobility comments: VC's for sequencing. Assist for shoulder position and LE elevation to bed.   Transfers Overall transfer level: Needs assistance Equipment used: Rolling [place]to (2 wheeled);1 person hand held assist (Only able to grasp Right side of [place]) Transfers: Sit to/from [facility] Sit to Stand: Min assist Stand pivot transfers: Mod assist;+2 safety/equipment       General transfer comment: Patient impulsive with moving and transfer, attempting to perform before therapist ready.  Left side weakness and decreased awareness of Left side, or environmental safety.  Ambulation/Gait Ambulation/Gait assistance: Mod assist Ambulation Distance (Feet): 15 Feet Assistive device: Rolling [place] (2 wheeled);1 person hand held assist Gilford Rile for RUE support, Therapist for Left side support.) Gait Pattern/deviations: Step-to pattern;Ataxic     General Gait Details: Decreased awareness and use of Left side, dragging Left foot, allowing Left arm to drop, unaware of envioronment and obstacles on Left side, multiple near falls without assistance.   Stairs            Wheelchair Mobility    Modified Rankin (Stroke Patients Only)       Balance Overall balance assessment: Needs assistance Sitting-balance support: Bilateral upper extremity supported Sitting balance-Leahy Scale: Fair     Standing balance support: During functional activity;Bilateral upper extremity supported Standing balance-Leahy Scale: Poor Standing balance comment: Unaware/unconcerned for Left side placement or position                            Cognition Arousal/Alertness: Awake/alert Behavior During Therapy: Impulsive;Flat affect Overall Cognitive Status: Impaired/Different from  baseline Area of Impairment: Orientation;Attention;Memory;Following commands;Safety/judgement;Awareness;Problem solving                  Orientation Level: Disoriented to;Place;Time;Situation Current Attention Level: Sustained Memory: Decreased short-term memory Following Commands: Follows one step commands with increased time;Follows one step commands inconsistently Safety/Judgement: Decreased awareness of safety;Decreased awareness of deficits Awareness: Emergent Problem Solving: Slow processing;Decreased initiation;Difficulty sequencing;Requires verbal cues        Exercises      General Comments General comments (skin integrity, edema, etc.): Decreased safety insight, high fall risk      Pertinent Vitals/Pain Pain Assessment: 0-10 Pain Score: 2  Pain Location: Left side Pain Descriptors / Indicators: Discomfort Pain Intervention(s): Monitored during session (Patient reports uses pain pills routinely at home)    Home Living                      Prior Function            PT Goals (current goals can now be found in the care plan section) Acute Rehab PT Goals Patient Stated Goal: Return home to husband of 41 years PT Goal Formulation: With patient Time For Goal Achievement: 12/31/16 Potential to Achieve Goals: Fair Progress towards PT goals: Not progressing toward goals - comment (Safety insight limitation)    Frequency    Min 4X/week      PT Plan Discharge plan needs to be updated    Co-evaluation             End of Session Equipment Utilized During Treatment: Gait belt Activity Tolerance: Patient tolerated treatment well Patient left: in bed;with call bell/phone within reach;with bed alarm set Nurse Communication: Mobility status;Precautions PT Visit Diagnosis: Other abnormalities of gait and mobility (R26.89);Hemiplegia and hemiparesis Hemiplegia - Right/Left: Left Hemiplegia - dominant/non-dominant: Non-dominant Hemiplegia - caused by: Other cerebrovascular disease (Brain mass)     Time: 0940-1005 PT Time Calculation (min) (ACUTE ONLY): 25 min  Charges:   $Therapeutic Activity: 8-22 mins                    G Codes:       Judith Blonder, DPT   Susan Mahoney 12/19/2016, 10:22 AM

## 2016-12-22 ENCOUNTER — Ambulatory Visit (HOSPITAL_COMMUNITY): Payer: Self-pay

## 2016-12-22 ENCOUNTER — Telehealth (HOSPITAL_COMMUNITY): Payer: Self-pay

## 2016-12-22 NOTE — Telephone Encounter (Signed)
Please refer to [facility].  Robynn Pane, PA-C 12/22/2016 4:26 PM

## 2016-12-22 NOTE — Telephone Encounter (Signed)
Patients daughter, Sharyn Lull, called requesting patients care be transferred to [facility]. I asked if we had done anything to upset them or if [facility] was closer for them and she answered no to both questions. Daughter states her dad "went through some stuff and I don't want him coming up there showing out". She did not and would not go into any detail. She states the [facility] gave her the following number for referral to be made. [phone]). I have sent message to scheduling and to MD and PA-C.

## 2016-12-29 ENCOUNTER — Encounter: Payer: Self-pay | Admitting: Internal Medicine

## 2016-12-29 ENCOUNTER — Encounter: Payer: Self-pay | Admitting: *Deleted

## 2016-12-29 NOTE — Progress Notes (Signed)
Oncology Nurse Navigator Documentation  Oncology Nurse Navigator Flowsheets 12/29/2016  Navigator Location [facility]  Navigator Encounter Type Other/I received referral on Susan Mahoney.  Patient was seen by Dr. Irene Limbo at [facility].  Patient would like to get treatment here.  I updated HIM dept to call patient and set up an appt with Dr. Irene Limbo here at the cancer center at [facility].    Barriers/Navigation Needs Coordination of Care  Interventions Coordination of Care  Coordination of Care Appts  Acuity Level 1  Time Spent with Patient 15

## 2017-01-05 ENCOUNTER — Ambulatory Visit: Payer: Self-pay | Admitting: Adult Health

## 2017-01-19 ENCOUNTER — Encounter: Payer: Self-pay | Admitting: Internal Medicine

## 2017-01-19 ENCOUNTER — Ambulatory Visit (HOSPITAL_BASED_OUTPATIENT_CLINIC_OR_DEPARTMENT_OTHER): Payer: Self-pay | Admitting: Internal Medicine

## 2017-01-19 ENCOUNTER — Other Ambulatory Visit: Payer: Self-pay | Admitting: *Deleted

## 2017-01-19 ENCOUNTER — Other Ambulatory Visit (HOSPITAL_BASED_OUTPATIENT_CLINIC_OR_DEPARTMENT_OTHER): Payer: Self-pay

## 2017-01-19 ENCOUNTER — Telehealth: Payer: Self-pay | Admitting: Internal Medicine

## 2017-01-19 VITALS — BP 135/87 | HR 94 | Temp 98.6°F | Ht 61.0 in

## 2017-01-19 DIAGNOSIS — R918 Other nonspecific abnormal finding of lung field: Secondary | ICD-10-CM

## 2017-01-19 DIAGNOSIS — C3412 Malignant neoplasm of upper lobe, left bronchus or lung: Secondary | ICD-10-CM

## 2017-01-19 DIAGNOSIS — E669 Obesity, unspecified: Secondary | ICD-10-CM

## 2017-01-19 DIAGNOSIS — E1169 Type 2 diabetes mellitus with other specified complication: Secondary | ICD-10-CM

## 2017-01-19 DIAGNOSIS — R630 Anorexia: Secondary | ICD-10-CM

## 2017-01-19 DIAGNOSIS — G936 Cerebral edema: Secondary | ICD-10-CM

## 2017-01-19 DIAGNOSIS — R05 Cough: Secondary | ICD-10-CM

## 2017-01-19 DIAGNOSIS — R0602 Shortness of breath: Secondary | ICD-10-CM

## 2017-01-19 DIAGNOSIS — I10 Essential (primary) hypertension: Secondary | ICD-10-CM

## 2017-01-19 DIAGNOSIS — D496 Neoplasm of unspecified behavior of brain: Secondary | ICD-10-CM

## 2017-01-19 LAB — CBC WITH DIFFERENTIAL/PLATELET
BASO%: 0 % (ref 0.0–2.0)
BASOS ABS: 0 10*3/uL (ref 0.0–0.1)
EOS%: 0 % (ref 0.0–7.0)
Eosinophils Absolute: 0 10*3/uL (ref 0.0–0.5)
HCT: 39.2 % (ref 34.8–46.6)
HEMOGLOBIN: 12.4 g/dL (ref 11.6–15.9)
LYMPH%: 13.4 % — AB (ref 14.0–49.7)
MCH: 21.8 pg — ABNORMAL LOW (ref 25.1–34.0)
MCHC: 31.6 g/dL (ref 31.5–36.0)
MCV: 68.8 fL — AB (ref 79.5–101.0)
MONO#: 0.7 10*3/uL (ref 0.1–0.9)
MONO%: 5.4 % (ref 0.0–14.0)
NEUT#: 10 10*3/uL — ABNORMAL HIGH (ref 1.5–6.5)
NEUT%: 81.2 % — ABNORMAL HIGH (ref 38.4–76.8)
NRBC: 0 % (ref 0–0)
Platelets: 130 10*3/uL — ABNORMAL LOW (ref 145–400)
RBC: 5.7 10*6/uL — ABNORMAL HIGH (ref 3.70–5.45)
RDW: 21.6 % — AB (ref 11.2–14.5)
WBC: 12.4 10*3/uL — AB (ref 3.9–10.3)
lymph#: 1.7 10*3/uL (ref 0.9–3.3)

## 2017-01-19 LAB — COMPREHENSIVE METABOLIC PANEL
ALBUMIN: 2.9 g/dL — AB (ref 3.5–5.0)
ALT: 39 U/L (ref 0–55)
ANION GAP: 12 meq/L — AB (ref 3–11)
AST: 14 U/L (ref 5–34)
Alkaline Phosphatase: 109 U/L (ref 40–150)
BILIRUBIN TOTAL: 0.51 mg/dL (ref 0.20–1.20)
BUN: 44.8 mg/dL — ABNORMAL HIGH (ref 7.0–26.0)
CO2: 23 mEq/L (ref 22–29)
CREATININE: 1.3 mg/dL — AB (ref 0.6–1.1)
Calcium: 9.6 mg/dL (ref 8.4–10.4)
Chloride: 97 mEq/L — ABNORMAL LOW (ref 98–109)
EGFR: 51 mL/min/{1.73_m2} — AB (ref >90)
Glucose: 518 mg/dl — ABNORMAL HIGH (ref 70–140)
Potassium: 4.7 mEq/L (ref 3.5–5.1)
Sodium: 132 mEq/L — ABNORMAL LOW (ref 136–145)
TOTAL PROTEIN: 6.4 g/dL (ref 6.4–8.3)

## 2017-01-19 NOTE — Telephone Encounter (Signed)
Gave patient avs report and appointments for May  °

## 2017-01-19 NOTE — Progress Notes (Signed)
[facility] Telephone:([phone]) [phone]   Fax:(336) [phone]  CONSULT NOTE  REFERRING PHYSICIAN: Dr. Ashok Pall  REASON FOR CONSULTATION:  66 years old African-American female with lung cancer.  HPI Susan Mahoney is a 66 y.o. female with past medical history significant for diabetes mellitus, hypertension, sinus infection, chronic kidney disease followed by nephrology at [facility]. The patient mentions that in May 2017 she was complaining of pain on the left side of the chest as well as left shoulder area. She was seen at the emergency Department at [facility] and x-ray of the left shoulder showed 6.6 cm masslike density at the left lung apex concerning for malignancy. This was followed by CT scan of the chest with contrast on 02/16/2017 and it showed a macro lobulated mass in the left upper lobe measuring 9.6 x 6.1 x 6.6 cm this lesion made a Road with the overlying pleura and clearly demonstrated direct chest wall invasion and the first and second intercostal spaces. There was also surrounding opacities in the left upper lobe favored to reflect some early lymphangitic spread of disease. The scan also showed a smoothly marginated pleural based lesion in the apex of the right hemithorax which is a slightly larger than the prior study from 09/19/2013. This is favored to be benign. On 02/19/2016 the patient underwent CT-guided core biopsy of the left chest wall mass by interventional radiology. The final pathology ([id number]) showed poorly differentiated malignancy. Immunohistochemical stains were performed but were nonspecific. The pathologist was unclear about the protocol type of this malignancy but he doesn't think this is malignancy is a carcinoma or melanoma. He has some suspicious of mesothelioma. The tissue block was sent to [facility] one and it was negative for any actionable mutations. The tumor mutation burden was intermediate. PDL 1 expression was performed and it  was 10%. The patient was treated at [facility] under the care of Dr. Whitney Muse with a course of concurrent chemoradiation with weekly carboplatin and paclitaxel. Repeat imaging studies by the end of the treatment showed improvement of her disease with shrinkage of the left upper lobe lung mass. The patient was started on treatment with immunotherapy with Nivolumab every 2 weeks for 9 cycles. Her treatment was discontinued in March 2018 after the patient presented to the emergency department with weakness of the left upper extremity and MRI of the brain showed highly suspicious meningioma with vasogenic edema. She was started on treatment with Decadron and her treatment with Nivolumab was discontinued. The patient was seen by Dr. Christella Noa. He did not recommend any surgical intervention for her meningioma. She continues on treatment with Decadron. Her family requested transfer of her care to the [facility] in [place] for management of her condition. When seen today she continues to complain of shortness of breath and dry cough but no significant chest pain or hemoptysis. She denied having any headache or visual changes. She has no nausea, vomiting, diarrhea or constipation. She denied having any recent weight loss or night sweats. Family history significant for mother died from old Asian father died from colon cancer. The patient is married and has 1 daughter. She was accompanied today by her husband Shanon Brow and her daughter Sharyn Lull in addition to her sister and niece. She used to work at [employer]. She has a history of smoking 1 pack per day for around 45 years and she quit in May 2017. She has no history of alcohol or drug abuse.  HPI  Past  Medical History:  Diagnosis Date  . Anemia   . Chronic kidney disease (CKD), stage III (moderate)   . Diabetes mellitus, type 2 ([place])   . Hypertension   . Low vitamin B12 level 02/28/2016  . Obesity   . Pancoast tumor of left lung ([place]) 03/03/2016  .  Sinus infection   . Tobacco abuse     Past Surgical History:  Procedure Laterality Date  . ABDOMINAL HYSTERECTOMY    . COLONOSCOPY N/A 02/18/2016   Procedure: COLONOSCOPY;  Surgeon: Daneil Dolin, MD;  Location: [facility];  Service: Endoscopy;  Laterality: N/A;  . ESOPHAGOGASTRODUODENOSCOPY N/A 02/18/2016   Procedure: ESOPHAGOGASTRODUODENOSCOPY (EGD);  Surgeon: Daneil Dolin, MD;  Location: [facility];  Service: Endoscopy;  Laterality: N/A;    Family History  Problem Relation Age of Onset  . Cancer Father   . Diabetes Maternal Grandmother   . Arthritis Sister   . Diabetes Sister   . Arthritis Sister   . Diabetes Sister   . Arthritis Sister   . Diabetes Sister   . Arthritis Sister   . Diabetes Sister   . Arthritis Sister   . Diabetes Sister     Social History Social History  Substance Use Topics  . Smoking status: Former Smoker    Packs/day: 0.50    Years: 30.00    Types: Cigarettes    Quit date: 03/13/2016  . Smokeless tobacco: Never Used  . Alcohol use No    Allergies  Allergen Reactions  . Ace Inhibitors Swelling  . Indomethacin     Swelling     Current Outpatient Prescriptions  Medication Sig Dispense Refill  . acetaminophen (TYLENOL) 325 MG tablet Take 2 tablets (650 mg total) by mouth every 6 (six) hours as needed for mild pain (or Fever >/= 101). 30 tablet 0  . albuterol (PROVENTIL HFA;VENTOLIN HFA) 108 (90 BASE) MCG/ACT inhaler Inhale 1-2 puffs into the lungs every 6 (six) hours as needed for wheezing or shortness of breath. 1 Inhaler 0  . Cholecalciferol (VITAMIN D PO) Take 1 tablet by mouth daily. Reported on 03/31/2016    . conjugated estrogens (PREMARIN) vaginal cream Use .5 gm on vagina area daily for 2 weeks then 2-3 x daily (Patient taking differently: Place 1 Applicatorful vaginally 2 (two) times a week. ) 24 g 0  . dexamethasone (DECADRON) 4 MG tablet Take 1 tablet (4 mg total) by mouth 2 (two) times daily. 60 tablet 0  . ferrous gluconate  (FERGON) 324 MG tablet Take 1 tablet (324 mg total) by mouth 2 (two) times daily with a meal. 60 tablet 3  . glipiZIDE (GLUCOTROL) 10 MG tablet Take 5 mg by mouth daily after supper.    Marland Kitchen HYDROcodone-acetaminophen (NORCO) 5-325 MG tablet Take 1-2 tablets by mouth every 4 (four) hours as needed for moderate pain. 120 tablet 0  . lidocaine-prilocaine (EMLA) cream Apply a quarter size amount to port site 1 hour prior to chemo. Do not rub in. Cover with plastic wrap. 30 g 3  . magic mouthwash w/lidocaine SOLN Equal parts of: Benadryl 12.'5mg'$ /32m, Viscous lidocaine 2%, and Maalox. Swish and swallow 5 mL four times a day. (Patient taking differently: Take 5 mLs by mouth 4 (four) times daily as needed for mouth pain. Equal parts of: Benadryl 12.'5mg'$ /520m Viscous lidocaine 2%, and Maalox. Swish and swallow 5 mL four times a day.) 360 mL 1  . megestrol (MEGACE) 400 MG/10ML suspension Take 10 mLs (400 mg total) by mouth daily. (  Patient taking differently: Take 400 mg by mouth daily as needed (for appetie). ) 480 mL 1  . morphine (MS CONTIN) 15 MG 12 hr tablet Take 1 tablet (15 mg total) by mouth every 12 (twelve) hours. (Patient taking differently: Take 15 mg by mouth every 12 (twelve) hours as needed for pain. ) 60 tablet 0  . Nivolumab (OPDIVO IV) Inject into the vein every 14 (fourteen) days.     . ondansetron (ZOFRAN) 8 MG tablet Take 1 tablet (8 mg total) by mouth every 8 (eight) hours as needed for nausea or vomiting. 30 tablet 2  . potassium chloride SA (K-DUR,KLOR-CON) 20 MEQ tablet Take 1 tablet (20 mEq total) by mouth 2 (two) times daily. 60 tablet 0  . prochlorperazine (COMPAZINE) 10 MG tablet Take 1 tablet (10 mg total) by mouth every 6 (six) hours as needed for nausea or vomiting. 30 tablet 2   No current facility-administered medications for this visit.     Review of Systems  Constitutional: positive for fatigue Eyes: negative Ears, nose, mouth, throat, and face: negative Respiratory: positive  for cough and dyspnea on exertion Cardiovascular: negative Gastrointestinal: negative Genitourinary:negative Integument/breast: negative Hematologic/lymphatic: negative Musculoskeletal:positive for muscle weakness Neurological: positive for weakness Behavioral/Psych: negative Endocrine: negative Allergic/Immunologic: negative  Physical Exam  EPP:IRJJO, healthy, no distress, well nourished and well developed SKIN: skin color, texture, turgor are normal, no rashes or significant lesions HEAD: Normocephalic, No masses, lesions, tenderness or abnormalities EYES: normal, PERRLA, Conjunctiva are pink and non-injected EARS: External ears normal, Canals clear OROPHARYNX:no exudate, no erythema and lips, buccal mucosa, and tongue normal  NECK: supple, no adenopathy, no JVD LYMPH:  no palpable lymphadenopathy, no hepatosplenomegaly BREAST:not examined LUNGS: clear to auscultation , and palpation HEART: regular rate & rhythm, no murmurs and no gallops ABDOMEN:abdomen soft, non-tender, normal bowel sounds and no masses or organomegaly BACK: Back symmetric, no curvature., No CVA tenderness EXTREMITIES:no joint deformities, effusion, or inflammation, no edema, no skin discoloration  NEURO: alert & oriented x 3 with fluent speech, no focal motor/sensory deficits  PERFORMANCE STATUS: ECOG 2  LABORATORY DATA: Lab Results  Component Value Date   WBC 12.4 (H) 01/19/2017   HGB 12.4 01/19/2017   HCT 39.2 01/19/2017   MCV 68.8 (L) 01/19/2017   PLT 130 (L) 01/19/2017      Chemistry      Component Value Date/Time   NA 132 (L) 01/19/2017 1350   K 4.7 01/19/2017 1350   CL 102 12/18/2016 0259   CO2 23 01/19/2017 1350   BUN 44.8 (H) 01/19/2017 1350   CREATININE 1.3 (H) 01/19/2017 1350      Component Value Date/Time   CALCIUM 9.6 01/19/2017 1350   ALKPHOS 109 01/19/2017 1350   AST 14 01/19/2017 1350   ALT 39 01/19/2017 1350   BILITOT 0.51 01/19/2017 1350       RADIOGRAPHIC  STUDIES: No results found.  ASSESSMENT: This is a very pleasant 66 years old African-American female with poorly differentiated malignancy of the left upper lobe status post a course of concurrent chemoradiation and was on treatment with single agent Nivolumab status post 9 cycles. The patient also has significant meningioma of the brain with vasogenic edema. Her treatment with Nivolumab was discontinued because of the high dose of Decadron for treatment of the vasogenic edema.  PLAN: I had a lengthy discussion with the patient and her family today about her current condition, prognosis and treatment options. It looks the patient was receiving benefit from treatment with  Nivolumab with control of her disease. I would like to resume her treatment with Nivolumab again but we will need to taper the dose of Decadron to less than 2 mg before starting the treatment. I recommended for the patient to start taking Decadron 2 mg by mouth twice a day for 1 week followed by 2 mg by mouth daily for 1 week. I would also ask Dr. Tammi Klippel to present her brain MRI to the brain tumor board to see if the patient would benefit from surgical resection or palliative local treatment for her meningioma. I will arrange for the patient to come back for follow-up visit in 2 weeks for reevaluation and consideration of resuming her treatment with immunotherapy. I gave the patient the option of returning back to [place] for her treatment versus continuing treatment in [place]. The patient and her family would like to continue her treatment in [place]. The patient was advised to call immediately if she has any concerning symptoms in the interval. The patient voices understanding of current disease status and treatment options and is in agreement with the current care plan.  All questions were answered. The patient knows to call the clinic with any problems, questions or concerns. We can certainly see the patient much  sooner if necessary.  Thank you so much for allowing me to participate in the care of Susan Mahoney. I will continue to follow up the patient with you and assist in her care.  I spent 55 minutes counseling the patient face to face. The total time spent in the appointment was 80 minutes.  Disclaimer: This note was dictated with voice recognition software. Similar sounding words can inadvertently be transcribed and may not be corrected upon review.   Hattie Aguinaldo K. Jan 19, 2017, 9:55 PM

## 2017-01-25 ENCOUNTER — Other Ambulatory Visit: Payer: Self-pay | Admitting: Radiation Therapy

## 2017-01-25 DIAGNOSIS — D329 Benign neoplasm of meninges, unspecified: Secondary | ICD-10-CM

## 2017-01-27 ENCOUNTER — Ambulatory Visit: Payer: Self-pay | Admitting: Adult Health

## 2017-01-29 ENCOUNTER — Telehealth: Payer: Self-pay | Admitting: *Deleted

## 2017-01-30 ENCOUNTER — Ambulatory Visit (HOSPITAL_COMMUNITY): Payer: Medicaid Other

## 2017-02-01 ENCOUNTER — Institutional Professional Consult (permissible substitution): Payer: Self-pay | Admitting: Radiation Oncology

## 2017-02-01 ENCOUNTER — Ambulatory Visit: Payer: Self-pay

## 2017-02-01 ENCOUNTER — Other Ambulatory Visit: Payer: Self-pay

## 2017-02-01 ENCOUNTER — Ambulatory Visit: Payer: Self-pay | Admitting: Oncology

## 2017-02-09 ENCOUNTER — Ambulatory Visit: Payer: Self-pay | Admitting: Adult Health

## 2017-02-19 NOTE — Telephone Encounter (Signed)
Call received in [place] from [place] stating pt was found deceased this AM upon family's arrival to her home.  Request for death certificate to be sent to this office.  No other concerns per call and family present in the home.

## 2017-02-19 DEATH — deceased

## 2017-03-27 ENCOUNTER — Other Ambulatory Visit: Payer: Self-pay | Admitting: Nurse Practitioner

## 2017-07-11 IMAGING — CT CT CHEST W/ CM
2 of 3 series · 15 of 36 positions shown, 18 images · IV contrast (iopamidol)
Comparison: Chest CT 09/19/2013, 02/17/2016 and 06/11/2016. PET-CT
03/12/2016.

CLINICAL DATA: History of left lung Pancoast tumor diagnosed in Wednesday January, 2016. Restaging post chemotherapy and radiation therapy.

EXAM:
CT CHEST WITH CONTRAST
TECHNIQUE: Multidetector CT imaging of the chest was performed during
intravenous contrast administration.
CONTRAST:  75mL [id number] IOPAMIDOL ([id number]) INJECTION 61%

[Series 2: axial st · axial · 0.54mm/px · z∈[-304,-76]mm · 12 of 134 slices shown, 15 images]
[im 10/134  mediastinal]
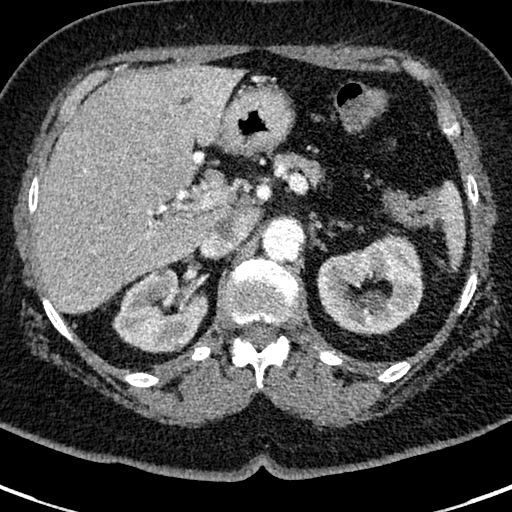
[im 10/134  lung]
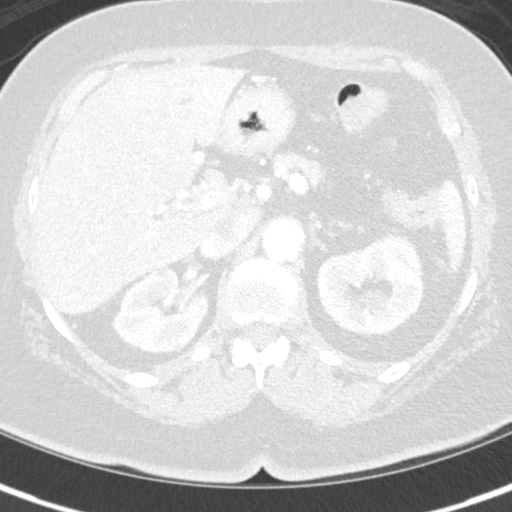
[im 20/134  lung]
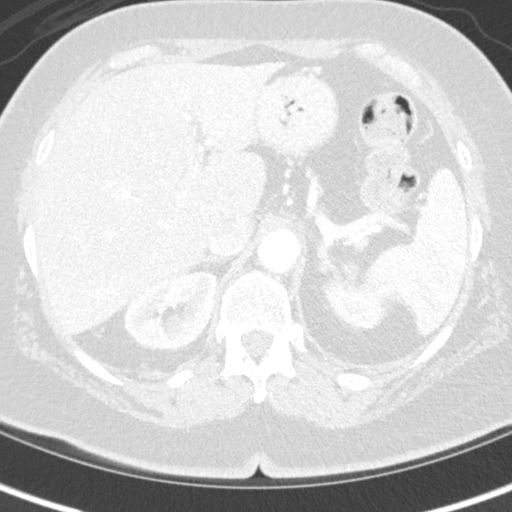
[im 30/134  lung]
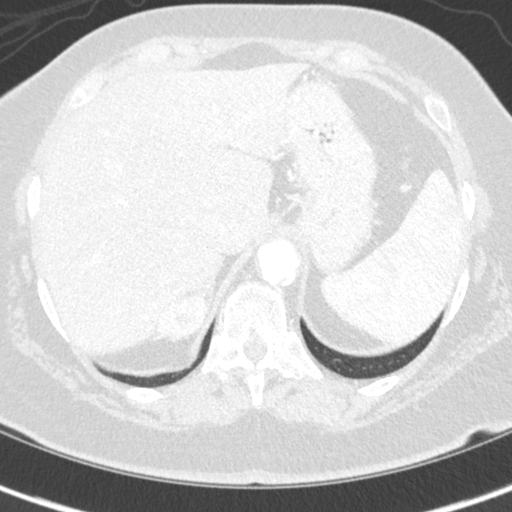
[im 40/134  lung]
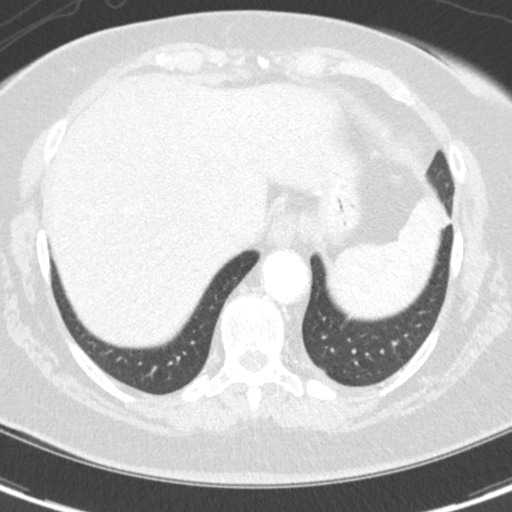
[im 50/134  mediastinal]
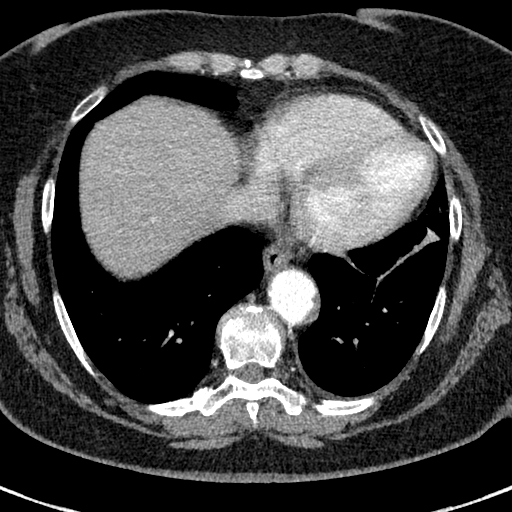
[im 50/134  lung]
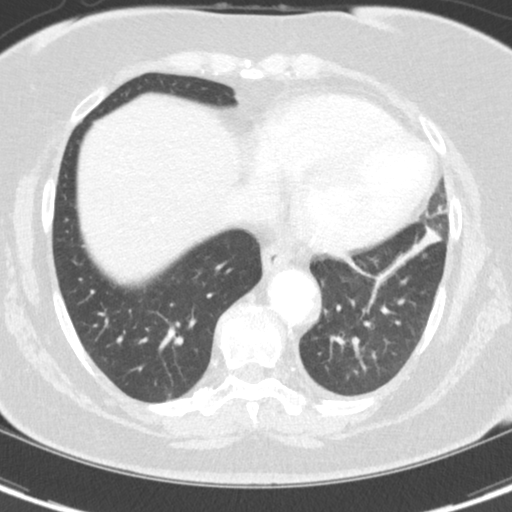
[im 60/134  lung]
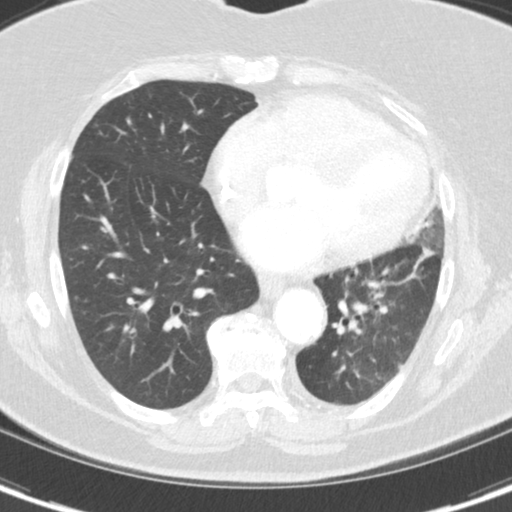
[im 74/134  lung]
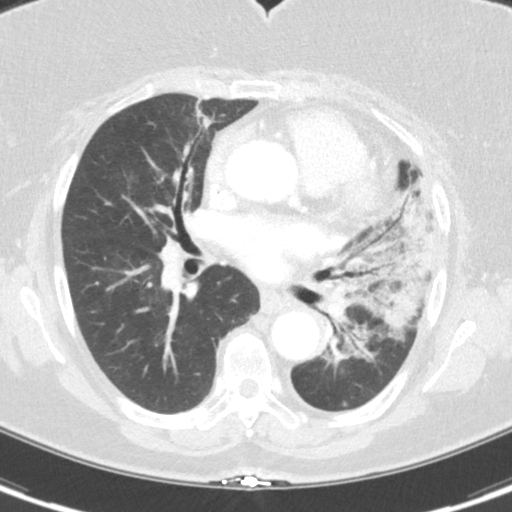
[im 84/134  lung]
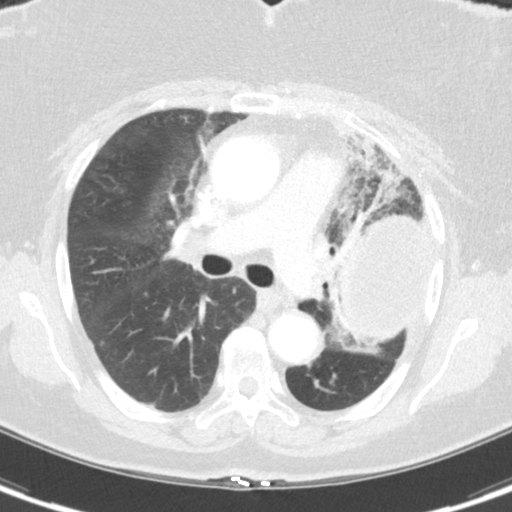
[im 94/134  mediastinal]
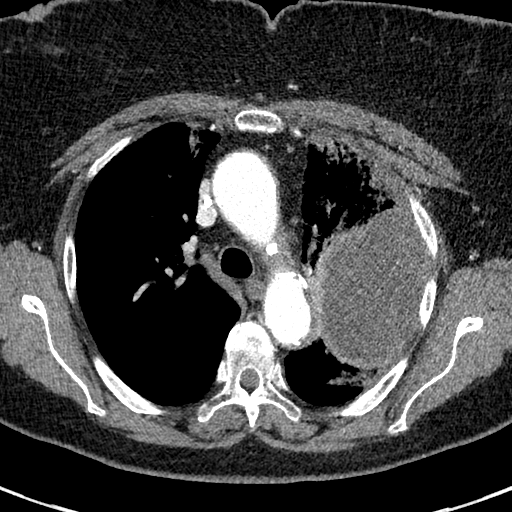
[im 94/134  lung]
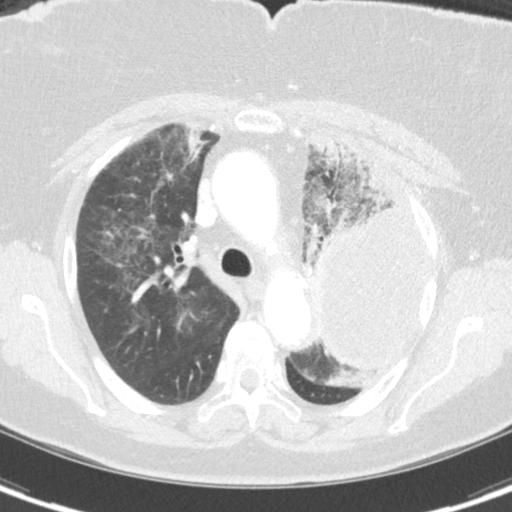
[im 104/134  lung]
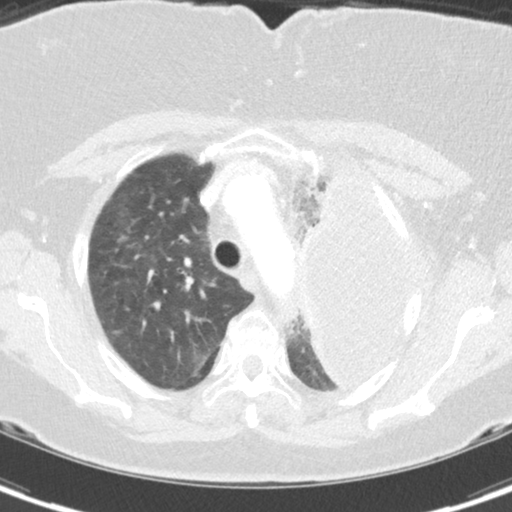
[im 114/134  lung]
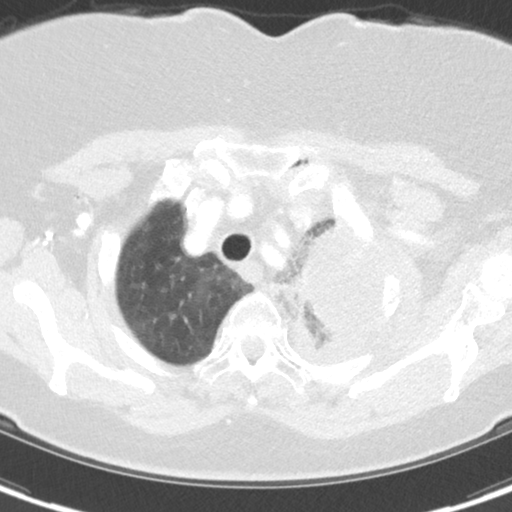
[im 124/134  lung]
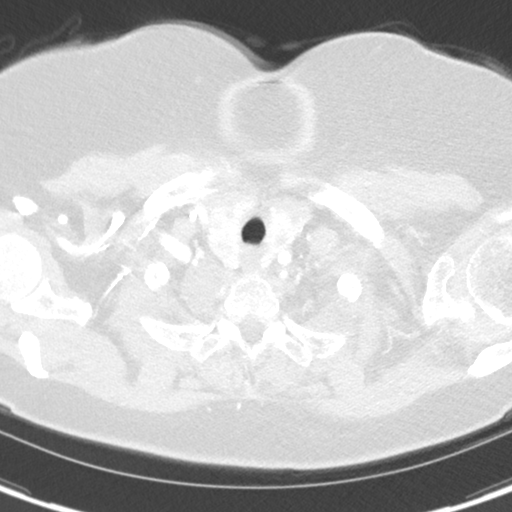

[Series 5: coronal · coronal · 0.57mm/px · 3 of 124 slices shown]
[im 25/124  lung]
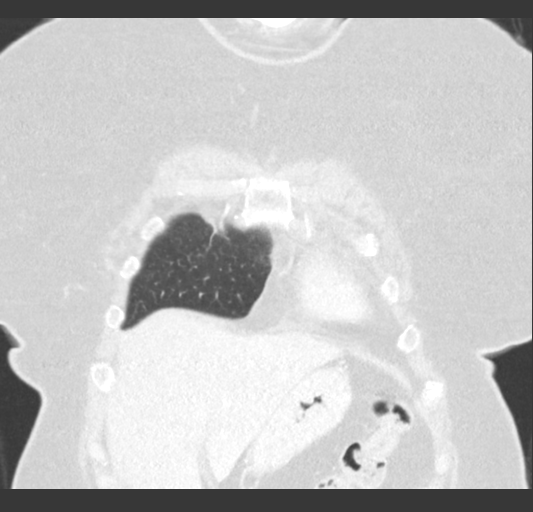
[im 50/124  lung]
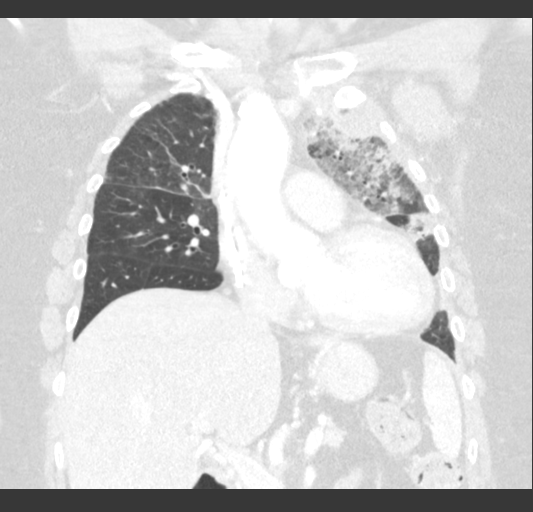
[im 74/124  lung]
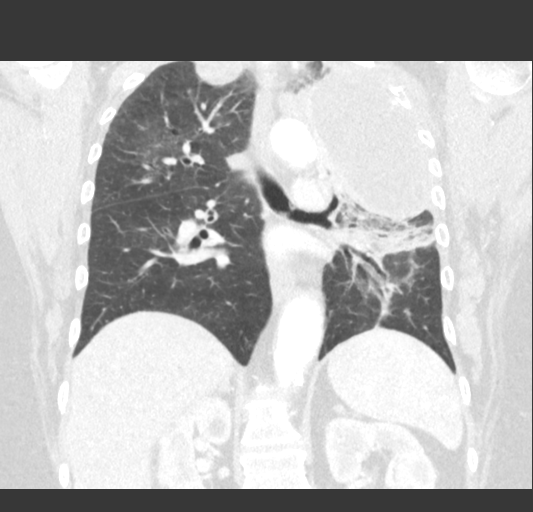

[15 of 36 positions shown; findings below may reference images not displayed]

FINDINGS: Cardiovascular: No acute vascular findings are seen. There is
diffuse atherosclerosis of the aorta, great vessels and coronary
arteries. There is a right IJ Port-A-Cath with its tip near the SVC
right atrial junction. The heart size is normal. There is no
pericardial effusion.

Mediastinum/Nodes: There are no enlarged mediastinal, hilar or
axillary lymph nodes. There is stable diffuse thyroid nodularity.
The esophagus and trachea demonstrate no significant findings.

Lungs/Pleura: Trace pleural fluid dependently on the left. No
significant pleural fluid on the right. No pneumothorax. Large
necrotic left apical mass is again noted with associated chest wall
invasion. This currently measures approximately 11.5 x 6.8 x 8.6 cm
(previously 11.9 x 6.8 x 8.8 cm). Surrounding parenchymal opacity in
the left upper lobe appears about the same, although has mildly
worsened in the superior segment of the lower lobe. Patchy airspace
opacity in the right upper lobe has improved. 3.3 x 2.9 cm right
apical lesion appears unchanged. No new pulmonary nodules are seen.

Upper abdomen: The visualized upper abdomen appears stable without
suspicious findings. There is no adrenal mass. Bilateral renal cysts
are present. Chronic collaterals in the porta hepatis.

Musculoskeletal/Chest wall: Left apical chest wall invasion and
partial destruction/pathologic fracture of the left second rib
appear unchanged. No new osseous findings seen.
IMPRESSION: 1. No significant change in dominant necrotic left apical mass with
chest wall invasion and destruction of the left second rib.
2. Surrounding fluctuating airspace opacities are favored to be
secondary to postobstructive pneumonitis and radiation therapy. The
distribution, appearance and fluctuation are not typical for
lymphangitic spread of tumor.
3. Stable thyroid nodularity.
4. Diffuse atherosclerosis.
